# Patient Record
Sex: Male | Born: 1940
Health system: Southern US, Community
[De-identification: ages and names within clinical notes are randomized; demographics above are authoritative.]

## PROBLEM LIST (undated history)

## (undated) DIAGNOSIS — K219 Gastro-esophageal reflux disease without esophagitis: Secondary | ICD-10-CM

## (undated) DIAGNOSIS — E785 Hyperlipidemia, unspecified: Secondary | ICD-10-CM

## (undated) DIAGNOSIS — J449 Chronic obstructive pulmonary disease, unspecified: Secondary | ICD-10-CM

## (undated) DIAGNOSIS — R0600 Dyspnea, unspecified: Secondary | ICD-10-CM

## (undated) DIAGNOSIS — Z809 Family history of malignant neoplasm, unspecified: Secondary | ICD-10-CM

## (undated) DIAGNOSIS — I10 Essential (primary) hypertension: Secondary | ICD-10-CM

## (undated) DIAGNOSIS — C801 Malignant (primary) neoplasm, unspecified: Secondary | ICD-10-CM

## (undated) HISTORY — DX: Family history of malignant neoplasm, unspecified: Z80.9

## (undated) HISTORY — PX: HEMORROIDECTOMY: SUR656

## (undated) HISTORY — DX: Hyperlipidemia, unspecified: E78.5

## (undated) HISTORY — DX: Essential (primary) hypertension: I10

## (undated) HISTORY — PX: COLONOSCOPY: SHX174

## (undated) MED FILL — Dexamethasone Sodium Phosphate Inj 100 MG/10ML: INTRAMUSCULAR | Qty: 1 | Status: AC

---

## 2009-04-01 ENCOUNTER — Ambulatory Visit: Payer: Self-pay | Admitting: Gastroenterology

## 2010-08-09 ENCOUNTER — Ambulatory Visit: Payer: Self-pay | Admitting: Otolaryngology

## 2011-02-28 ENCOUNTER — Ambulatory Visit: Payer: Self-pay | Admitting: Family Medicine

## 2012-07-16 ENCOUNTER — Ambulatory Visit: Payer: Self-pay | Admitting: Family Medicine

## 2012-11-15 ENCOUNTER — Ambulatory Visit: Payer: Self-pay | Admitting: Family Medicine

## 2013-09-05 DIAGNOSIS — Z1211 Encounter for screening for malignant neoplasm of colon: Secondary | ICD-10-CM | POA: Diagnosis not present

## 2013-09-05 DIAGNOSIS — Z136 Encounter for screening for cardiovascular disorders: Secondary | ICD-10-CM | POA: Diagnosis not present

## 2013-09-05 DIAGNOSIS — Z0000 Encounter for general adult medical examination without abnormal findings: Secondary | ICD-10-CM | POA: Diagnosis not present

## 2013-09-05 DIAGNOSIS — Z1331 Encounter for screening for depression: Secondary | ICD-10-CM | POA: Diagnosis not present

## 2013-10-09 DIAGNOSIS — I10 Essential (primary) hypertension: Secondary | ICD-10-CM | POA: Diagnosis not present

## 2013-10-09 DIAGNOSIS — E785 Hyperlipidemia, unspecified: Secondary | ICD-10-CM | POA: Diagnosis not present

## 2014-08-28 DIAGNOSIS — Z716 Tobacco abuse counseling: Secondary | ICD-10-CM | POA: Diagnosis not present

## 2014-08-28 DIAGNOSIS — I10 Essential (primary) hypertension: Secondary | ICD-10-CM | POA: Diagnosis not present

## 2014-09-25 DIAGNOSIS — I10 Essential (primary) hypertension: Secondary | ICD-10-CM | POA: Diagnosis not present

## 2014-09-25 DIAGNOSIS — Z1389 Encounter for screening for other disorder: Secondary | ICD-10-CM | POA: Diagnosis not present

## 2014-09-25 DIAGNOSIS — R42 Dizziness and giddiness: Secondary | ICD-10-CM | POA: Diagnosis not present

## 2014-12-22 DIAGNOSIS — R42 Dizziness and giddiness: Secondary | ICD-10-CM | POA: Diagnosis not present

## 2014-12-22 DIAGNOSIS — K649 Unspecified hemorrhoids: Secondary | ICD-10-CM | POA: Diagnosis not present

## 2014-12-22 DIAGNOSIS — I10 Essential (primary) hypertension: Secondary | ICD-10-CM | POA: Diagnosis not present

## 2014-12-22 DIAGNOSIS — K5790 Diverticulosis of intestine, part unspecified, without perforation or abscess without bleeding: Secondary | ICD-10-CM | POA: Diagnosis not present

## 2014-12-22 DIAGNOSIS — N402 Nodular prostate without lower urinary tract symptoms: Secondary | ICD-10-CM | POA: Diagnosis not present

## 2015-04-23 ENCOUNTER — Ambulatory Visit: Payer: Self-pay | Admitting: Family Medicine

## 2015-05-07 ENCOUNTER — Encounter: Payer: Self-pay | Admitting: Family Medicine

## 2015-05-07 ENCOUNTER — Ambulatory Visit (INDEPENDENT_AMBULATORY_CARE_PROVIDER_SITE_OTHER): Payer: BLUE CROSS/BLUE SHIELD | Admitting: Family Medicine

## 2015-05-07 VITALS — BP 138/72 | HR 100 | Temp 98.7°F | Resp 16 | Ht 66.0 in | Wt 145.2 lb

## 2015-05-07 DIAGNOSIS — E785 Hyperlipidemia, unspecified: Secondary | ICD-10-CM | POA: Diagnosis not present

## 2015-05-07 DIAGNOSIS — Z720 Tobacco use: Secondary | ICD-10-CM | POA: Insufficient documentation

## 2015-05-07 DIAGNOSIS — I10 Essential (primary) hypertension: Secondary | ICD-10-CM | POA: Diagnosis not present

## 2015-05-07 DIAGNOSIS — J410 Simple chronic bronchitis: Secondary | ICD-10-CM | POA: Insufficient documentation

## 2015-05-07 LAB — PULMONARY FUNCTION TEST

## 2015-05-07 MED ORDER — TIOTROPIUM BROMIDE MONOHYDRATE 18 MCG IN CAPS
18.0000 ug | ORAL_CAPSULE | Freq: Once | RESPIRATORY_TRACT | Status: DC
Start: 2015-05-07 — End: 2015-07-25

## 2015-05-07 NOTE — Patient Instructions (Signed)
Encourage discontinuation of smoking

## 2015-05-07 NOTE — Progress Notes (Signed)
Name: Eric [name]   MRN: [id number]    DOB: 1941-02-28   Date:05/07/2015       Progress Note  Subjective  Chief Complaint  Chief Complaint  Patient presents with  . Hypertension  . Hyperlipidemia    Hypertension This is a chronic problem. The current episode started more than 1 year ago. The problem is unchanged. The problem is controlled. Associated symptoms include anxiety and shortness of breath. Pertinent negatives include no blurred vision, chest pain, headaches, neck pain, orthopnea or palpitations. There are no associated agents to hypertension. Risk factors for coronary artery disease include male gender, smoking/tobacco exposure and stress. Past treatments include ACE inhibitors and diuretics. The current treatment provides moderate improvement. There are no compliance problems.   Hyperlipidemia This is a chronic problem. The current episode started more than 1 year ago. The problem is controlled. Recent lipid tests were reviewed and are normal. Factors aggravating his hyperlipidemia include fatty foods. Associated symptoms include shortness of breath. Pertinent negatives include no chest pain, focal weakness or myalgias. Current antihyperlipidemic treatment includes statins. The current treatment provides moderate improvement of lipids. There are no compliance problems.  Risk factors for coronary artery disease include dyslipidemia, hypertension, male sex and stress.  Breathing Problem He complains of cough, difficulty breathing and shortness of breath. There is no hemoptysis or wheezing. This is a chronic problem. The current episode started more than 1 year ago. The problem occurs intermittently. The problem has been unchanged. Pertinent negatives include no chest pain, fever, headaches, heartburn, myalgias, sore throat or weight loss. His symptoms are aggravated by exposure to smoke. His symptoms are alleviated by rest. He reports moderate improvement on treatment. Risk factors for  lung disease include smoking/tobacco exposure. His past medical history is significant for COPD.     Past Medical History  Diagnosis Date  . Hyperlipidemia   . Hypertension     History  Substance Use Topics  . Smoking status: Current Every Day Smoker  . Smokeless tobacco: Not on file  . Alcohol Use: No     Current outpatient prescriptions:  .  aspirin 81 MG tablet, Take 81 mg by mouth daily., Disp: , Rfl:  .  atorvastatin (LIPITOR) 20 MG tablet, Take 20 mg by mouth at bedtime., Disp: , Rfl: 2 .  triamterene-hydrochlorothiazide (MAXZIDE-25) 37.5-25 MG per tablet, TAKE 1 TABLET BY MOUTH EVERY DAY----STOP LISINOPRIL-HCTZ, Disp: , Rfl: 7  No Known Allergies  Review of Systems  Constitutional: Negative for fever, chills and weight loss.  HENT: Negative for congestion, hearing loss, sore throat and tinnitus.   Eyes: Negative for blurred vision, double vision and redness.  Respiratory: Positive for cough and shortness of breath. Negative for hemoptysis and wheezing.   Cardiovascular: Negative for chest pain, palpitations, orthopnea, claudication and leg swelling.  Gastrointestinal: Negative for heartburn, nausea, vomiting, diarrhea, constipation and blood in stool.  Genitourinary: Negative for dysuria, urgency, frequency and hematuria.  Musculoskeletal: Negative for myalgias, back pain, joint pain, falls and neck pain.  Skin: Negative for itching.  Neurological: Negative for dizziness, tingling, tremors, focal weakness, seizures, loss of consciousness, weakness and headaches.  Endo/Heme/Allergies: Does not bruise/bleed easily.  Psychiatric/Behavioral: Negative for depression and substance abuse. The patient is not nervous/anxious and does not have insomnia.      Objective  Filed Vitals:   05/07/15 1203  BP: 138/72  Pulse: 100  Temp: 98.7 F (37.1 C)  Resp: 16  Height: 5\' 6"  (1.676 m)  Weight: 145 lb 4 oz (  65.885 kg)  SpO2: 97%     Physical Exam  Constitutional: He is  oriented to person, place, and time and well-developed, well-nourished, and in no distress.  HENT:  Head: Normocephalic.  Eyes: EOM are normal. Pupils are equal, round, and reactive to light.  Neck: Normal range of motion. Neck supple. No thyromegaly present.  Cardiovascular: Normal rate, regular rhythm and normal heart sounds.   No murmur heard. Pulmonary/Chest: No respiratory distress. He has no wheezes.  Slightly diminished breath sounds with hyperresonance to palpation  Abdominal: Soft. Bowel sounds are normal.  Musculoskeletal: Normal range of motion. He exhibits no edema.  Lymphadenopathy:    He has no cervical adenopathy.  Neurological: He is alert and oriented to person, place, and time. No cranial nerve deficit. Gait normal. Coordination normal.  Skin: Skin is warm and dry. No rash noted.  Psychiatric: Mood, affect and judgment normal.      Assessment & Plan  1. Simple chronic bronchitis  - tiotropium (SPIRIVA HANDIHALER) 18 MCG inhalation capsule; Place 1 capsule (18 mcg total) into inhaler and inhale once.  Dispense: 30 capsule; Refill: 1  2. Essential hypertension Well-controlled  3. Hyperlipidemia Well-controlled  4. Tobacco abuse Again emphasized the need to stop is not willing at this point

## 2015-06-24 ENCOUNTER — Other Ambulatory Visit: Payer: Self-pay | Admitting: Family Medicine

## 2015-07-25 ENCOUNTER — Other Ambulatory Visit: Payer: Self-pay | Admitting: Family Medicine

## 2015-08-10 ENCOUNTER — Ambulatory Visit: Payer: Medicare Other | Admitting: Family Medicine

## 2015-08-13 ENCOUNTER — Ambulatory Visit (INDEPENDENT_AMBULATORY_CARE_PROVIDER_SITE_OTHER): Payer: BLUE CROSS/BLUE SHIELD | Admitting: Family Medicine

## 2015-08-13 ENCOUNTER — Encounter: Payer: Self-pay | Admitting: Family Medicine

## 2015-08-13 VITALS — BP 142/78 | HR 96 | Temp 98.0°F | Resp 16 | Ht 66.0 in | Wt 145.1 lb

## 2015-08-13 DIAGNOSIS — E785 Hyperlipidemia, unspecified: Secondary | ICD-10-CM

## 2015-08-13 DIAGNOSIS — J438 Other emphysema: Secondary | ICD-10-CM

## 2015-08-13 DIAGNOSIS — I10 Essential (primary) hypertension: Secondary | ICD-10-CM | POA: Diagnosis not present

## 2015-08-13 DIAGNOSIS — Z720 Tobacco use: Secondary | ICD-10-CM | POA: Diagnosis not present

## 2015-08-13 MED ORDER — ATORVASTATIN CALCIUM 20 MG PO TABS: 20.0000 mg | ORAL_TABLET | Freq: Every day | ORAL | Status: DC

## 2015-08-13 NOTE — Progress Notes (Signed)
Name: Eric [name]   MRN: [id number]    DOB: 27-Dec-1940   Date:08/13/2015       Progress Note  Subjective  Chief Complaint  Chief Complaint  Patient presents with  . Hyperlipidemia    pt here for 3 month follow up  . Hypertension  . Nicotine Dependence    HPI   Hyperlipidemia  Patient has a history of hyperlipidemia for over 5 years.  Current medical regimen consist of atorvastatin 20 mg daily at bedtime .  Compliance is good .  Diet and exercise are currently followed usually .  Risk factors for cardiovascular disease include hyperlipidemia hypertension and advanced age and tobacco abuse .   There have been no side effects from the medication.    Hypertension   Patient presents for follow-up of hypertension. It has been present for ovover 5.  Patient states that there is compliance with medical regimen which consists of  lisinopril HCT 20-12 0.5 Reason dailyThere is no end organ disease. Cardiac risk factors include hypertension hyperlipidemia and diabetes.  Exercise regimen consist of some walkingDiet consist of some salt restriction  .  Tobacco abuse  Patient is a smoker despite worse. She is currently smoking  COPD  Patient has a long-standing history of tobacco abuse. Continues to smoke 1/3-1/2 pack per day. He has minimal cough wheezing currently.   Past Medical History  Diagnosis Date  . Hyperlipidemia   . Hypertension     Social History  Substance Use Topics  . Smoking status: Current Every Day Smoker  . Smokeless tobacco: Not on file  . Alcohol Use: No     Current outpatient prescriptions:  .  aspirin 81 MG tablet, Take 81 mg by mouth daily., Disp: , Rfl:  .  atorvastatin (LIPITOR) 20 MG tablet, TAKE 1 TABLET BY MOUTH AT BEDTIME, Disp: 30 tablet, Rfl: 2 .  SPIRIVA HANDIHALER 18 MCG inhalation capsule, PLACE 1 CAPSULE (18 MCG TOTAL) INTO INHALER AND INHALE ONCE., Disp: 90 capsule, Rfl: 1 .  triamterene-hydrochlorothiazide (MAXZIDE-25) 37.5-25 MG per  tablet, TAKE 1 TABLET BY MOUTH EVERY DAY----STOP LISINOPRIL-HCTZ, Disp: , Rfl: 7  No Known Allergies  Review of Systems  Constitutional: Negative for fever, chills and weight loss.  HENT: Negative for congestion, hearing loss, sore throat and tinnitus.   Eyes: Negative for blurred vision, double vision and redness.  Respiratory: Negative for cough, hemoptysis and shortness of breath.   Cardiovascular: Negative for chest pain, palpitations, orthopnea, claudication and leg swelling.  Gastrointestinal: Negative for heartburn, nausea, vomiting, diarrhea, constipation and blood in stool.  Genitourinary: Negative for dysuria, urgency, frequency and hematuria.  Musculoskeletal: Negative for myalgias, back pain, joint pain, falls and neck pain.  Skin: Negative for itching.  Neurological: Negative for dizziness, tingling, tremors, focal weakness, seizures, loss of consciousness, weakness and headaches.  Endo/Heme/Allergies: Does not bruise/bleed easily.  Psychiatric/Behavioral: Negative for depression and substance abuse. The patient is not nervous/anxious and does not have insomnia.      Objective  Filed Vitals:   08/13/15 1045  BP: 142/78  Pulse: 96  Temp: 98 F (36.7 C)  Resp: 16  Height: 5\' 6"  (1.676 m)  Weight: 145 lb 2 oz (65.828 kg)  SpO2: 98%     Physical Exam  Constitutional: He is oriented to person, place, and time and well-developed, well-nourished, and in no distress.  HENT:  Head: Normocephalic.  Eyes: EOM are normal. Pupils are equal, round, and reactive to light.  Neck: Normal range of motion. Neck  supple. No thyromegaly present.  Cardiovascular: Normal rate, regular rhythm and normal heart sounds.   No murmur heard. Pulmonary/Chest: Effort normal. No respiratory distress. He has no wheezes.  Diminished breath sounds  Abdominal: Soft. Bowel sounds are normal.  Musculoskeletal: Normal range of motion. He exhibits no edema.  Lymphadenopathy:    He has no cervical  adenopathy.  Neurological: He is alert and oriented to person, place, and time. No cranial nerve deficit. Gait normal. Coordination normal.  Skin: Skin is warm and dry. No rash noted.  Psychiatric: Affect and judgment normal.      Assessment & Plan   1. Essential hypertension At goal of less than 150 over - Comprehensive Metabolic Panel (CMET)  2. Simple chronic bronchitis (HCC)  Continue Spiriva 3. Hyperlipidemia Labs - Lipid Profile - TSH  4. Tobacco abuse Again encouraged discontinuation

## 2015-08-17 DIAGNOSIS — I10 Essential (primary) hypertension: Secondary | ICD-10-CM | POA: Diagnosis not present

## 2015-08-17 DIAGNOSIS — E785 Hyperlipidemia, unspecified: Secondary | ICD-10-CM | POA: Diagnosis not present

## 2015-08-18 LAB — LIPID PANEL
CHOL/HDL RATIO: 4.3 ratio (ref 0.0–5.0)
CHOLESTEROL TOTAL: 150 mg/dL (ref 100–199)
HDL: 35 mg/dL — ABNORMAL LOW (ref >39)
LDL Calculated: 92 mg/dL (ref 0–99)
TRIGLYCERIDES: 115 mg/dL (ref 0–149)
VLDL CHOLESTEROL CAL: 23 mg/dL (ref 5–40)

## 2015-08-18 LAB — COMPREHENSIVE METABOLIC PANEL
ALBUMIN: 4.1 g/dL (ref 3.5–4.8)
ALT: 8 IU/L (ref 0–44)
AST: 11 IU/L (ref 0–40)
Albumin/Globulin Ratio: 1.5 (ref 1.1–2.5)
Alkaline Phosphatase: 79 IU/L (ref 39–117)
BUN / CREAT RATIO: 15 (ref 10–22)
BUN: 17 mg/dL (ref 8–27)
Bilirubin Total: 0.4 mg/dL (ref 0.0–1.2)
CO2: 25 mmol/L (ref 18–29)
CREATININE: 1.14 mg/dL (ref 0.76–1.27)
Calcium: 10.1 mg/dL (ref 8.6–10.2)
Chloride: 100 mmol/L (ref 97–106)
GFR calc Af Amer: 73 mL/min/{1.73_m2} (ref >59)
GFR, EST NON AFRICAN AMERICAN: 63 mL/min/{1.73_m2} (ref >59)
GLUCOSE: 131 mg/dL — AB (ref 65–99)
Globulin, Total: 2.7 g/dL (ref 1.5–4.5)
Potassium: 4.9 mmol/L (ref 3.5–5.2)
SODIUM: 142 mmol/L (ref 136–144)
Total Protein: 6.8 g/dL (ref 6.0–8.5)

## 2015-08-18 LAB — TSH: TSH: 1.47 u[IU]/mL (ref 0.450–4.500)

## 2015-12-15 ENCOUNTER — Ambulatory Visit: Payer: Medicare Other | Admitting: Family Medicine

## 2016-02-14 ENCOUNTER — Other Ambulatory Visit: Payer: Self-pay | Admitting: Family Medicine

## 2016-02-17 ENCOUNTER — Other Ambulatory Visit: Payer: Self-pay | Admitting: Family Medicine

## 2016-03-16 ENCOUNTER — Other Ambulatory Visit: Payer: Self-pay | Admitting: Family Medicine

## 2016-09-12 ENCOUNTER — Other Ambulatory Visit: Payer: Self-pay | Admitting: Family Medicine

## 2017-03-16 ENCOUNTER — Encounter: Payer: Self-pay | Admitting: Family Medicine

## 2017-03-16 ENCOUNTER — Ambulatory Visit (INDEPENDENT_AMBULATORY_CARE_PROVIDER_SITE_OTHER): Payer: Medicare PPO | Admitting: Family Medicine

## 2017-03-16 VITALS — BP 139/78 | HR 103 | Temp 98.5°F | Resp 16 | Ht 66.0 in | Wt 142.2 lb

## 2017-03-16 DIAGNOSIS — E7800 Pure hypercholesterolemia, unspecified: Secondary | ICD-10-CM

## 2017-03-16 DIAGNOSIS — I10 Essential (primary) hypertension: Secondary | ICD-10-CM

## 2017-03-16 DIAGNOSIS — Z720 Tobacco use: Secondary | ICD-10-CM

## 2017-03-16 MED ORDER — TRIAMTERENE-HCTZ 37.5-25 MG PO TABS: 1.0000 | ORAL_TABLET | Freq: Every day | ORAL | 1 refills | Status: DC

## 2017-03-16 MED ORDER — ATORVASTATIN CALCIUM 20 MG PO TABS: 20.0000 mg | ORAL_TABLET | Freq: Every day | ORAL | 1 refills | Status: DC

## 2017-03-16 NOTE — Progress Notes (Signed)
Name: Eric [name]   MRN: [id number]    DOB: 27-Sep-1941   Date:03/16/2017       Progress Note  Subjective  Chief Complaint  Chief Complaint  Patient presents with  . Follow-up    BP  . Medication Refill    Hypertension  This is a chronic problem. The problem is unchanged. The problem is controlled. Pertinent negatives include no blurred vision, chest pain, headaches, orthopnea or palpitations. Past treatments include diuretics. There is no history of kidney disease, CAD/MI or CVA.  Hyperlipidemia  This is a chronic problem. The problem is controlled. Recent lipid tests were reviewed and are normal. Pertinent negatives include no chest pain, leg pain or myalgias. Current antihyperlipidemic treatment includes statins.  Nicotine Dependence  Presents for initial visit. Symptoms include cravings. Symptoms are negative for insomnia. Preferred tobacco types include cigarettes. Preferred cigarette types include filtered. Preferred strength is regular. Preferred brands include Marlboro. His urge triggers include company of smokers, drinking coffee, driving, meal time and stress. His first smoke is from 6 to 8 AM. He smokes < 1/2 a pack of cigarettes per day. He started smoking when he was >63 years old. Past treatments include nothing. [name] is not interested in quitting. [name] has tried to quit 0 times.     Past Medical History:  Diagnosis Date  . Hyperlipidemia   . Hypertension     History reviewed. No pertinent surgical history.  Family History  Problem Relation Age of Onset  . Heart disease Mother   . COPD Father     Social History   Social History  . Marital status: Single    Spouse name: N/A  . Number of children: N/A  . Years of education: N/A   Occupational History  . Not on file.   Social History Main Topics  . Smoking status: Current Every Day Smoker  . Smokeless tobacco: Never Used  . Alcohol use No  . Drug use: No  . Sexual activity: Not on file   Other  Topics Concern  . Not on file   Social History Narrative  . No narrative on file     Current Outpatient Prescriptions:  .  aspirin 81 MG tablet, Take 81 mg by mouth daily., Disp: , Rfl:  .  atorvastatin (LIPITOR) 20 MG tablet, TAKE 1 TABLET (20 MG TOTAL) BY MOUTH AT BEDTIME., Disp: 90 tablet, Rfl: 1 .  SPIRIVA HANDIHALER 18 MCG inhalation capsule, PLACE 1 CAPSULE (18 MCG TOTAL) INTO INHALER AND INHALE ONCE., Disp: 90 capsule, Rfl: 1 .  triamterene-hydrochlorothiazide (MAXZIDE-25) 37.5-25 MG tablet, TAKE 1 TABLET BY MOUTH EVERY DAY, Disp: 30 tablet, Rfl: 7  No Known Allergies   Review of Systems  Eyes: Negative for blurred vision.  Cardiovascular: Negative for chest pain, palpitations and orthopnea.  Musculoskeletal: Negative for myalgias.  Neurological: Negative for headaches.  Psychiatric/Behavioral: The patient does not have insomnia.      Objective  Vitals:   03/16/17 0940  BP: 139/78  Pulse: (!) 103  Resp: 16  Temp: 98.5 F (36.9 C)  TempSrc: Oral  SpO2: 96%  Weight: 142 lb 3.2 oz (64.5 kg)  Height: 5\' 6"  (1.676 m)    Physical Exam  Constitutional: He is oriented to person, place, and time and well-developed, well-nourished, and in no distress.  HENT:  Head: Normocephalic and atraumatic.  Cardiovascular: Normal rate, regular rhythm and normal heart sounds.   No murmur heard. Pulmonary/Chest: Effort normal and breath sounds normal. He has no wheezes.  Abdominal: Soft. Bowel sounds are normal. There is no tenderness.  Neurological: He is alert and oriented to person, place, and time.  Psychiatric: Mood, memory, affect and judgment normal.  Nursing note and vitals reviewed.    Assessment & Plan  1. Pure hypercholesterolemia Obtain FLP and adjust statin as appropriate - atorvastatin (LIPITOR) 20 MG tablet; Take 1 tablet (20 mg total) by mouth daily at 6 PM.  Dispense: 90 tablet; Refill: 1 - COMPLETE METABOLIC PANEL WITH GFR - Lipid panel  2. Essential  hypertension BP stable on present and hypertensive therapy - triamterene-hydrochlorothiazide (MAXZIDE-25) 37.5-25 MG tablet; Take 1 tablet by mouth daily.  Dispense: 90 tablet; Refill: 1  3. Tobacco abuse Patient is not ready to quit at this time, we will reevaluate and assess at next appointment   [facility] 03/16/2017 10:05 AM

## 2017-04-17 ENCOUNTER — Other Ambulatory Visit: Payer: Self-pay | Admitting: Family Medicine

## 2017-04-17 LAB — LIPID PANEL
CHOLESTEROL: 169 mg/dL (ref <200)
HDL: 40 mg/dL — ABNORMAL LOW (ref >40)
LDL CALC: 109 mg/dL — AB (ref <100)
Total CHOL/HDL Ratio: 4.2 Ratio (ref <5.0)
Triglycerides: 101 mg/dL (ref <150)
VLDL: 20 mg/dL (ref <30)

## 2017-04-17 LAB — COMPLETE METABOLIC PANEL WITH GFR
ALT: 9 U/L (ref 9–46)
AST: 11 U/L (ref 10–35)
Albumin: 4 g/dL (ref 3.6–5.1)
Alkaline Phosphatase: 73 U/L (ref 40–115)
BUN: 15 mg/dL (ref 7–25)
CALCIUM: 9.9 mg/dL (ref 8.6–10.3)
CHLORIDE: 102 mmol/L (ref 98–110)
CO2: 28 mmol/L (ref 20–31)
CREATININE: 1.24 mg/dL — AB (ref 0.70–1.18)
GFR, Est African American: 65 mL/min (ref >=60)
GFR, Est Non African American: 57 mL/min — ABNORMAL LOW (ref >=60)
Glucose, Bld: 105 mg/dL — ABNORMAL HIGH (ref 65–99)
Potassium: 4.7 mmol/L (ref 3.5–5.3)
Sodium: 139 mmol/L (ref 135–146)
Total Bilirubin: 0.5 mg/dL (ref 0.2–1.2)
Total Protein: 7.1 g/dL (ref 6.1–8.1)

## 2017-04-20 ENCOUNTER — Telehealth: Payer: Self-pay | Admitting: Family Medicine

## 2017-04-20 NOTE — Telephone Encounter (Signed)
Patient had an attack of 'dizziness' 2 nights ago, now resolved. He is wondering if it may be vertigo. I have recommended that he should come in for a complete evaluation and workup, he will schedule an appointment for Monday, July 2nd. Please schedule

## 2017-04-20 NOTE — Telephone Encounter (Signed)
Pt states that you had dx him with vertigo and he would like to know is there anything over the counter that he can take for it. States he had an attack with it the other night. Please return call [phone]

## 2017-04-20 NOTE — Telephone Encounter (Signed)
Dr. Manuella Ghazi, Pt states that you had dx him with vertigo and he would like to know is there anything over the counter that he can take for it. States he had an attack with it the other night. Please return call [phone]

## 2017-04-21 NOTE — Telephone Encounter (Signed)
Patient has scheduled appointment for dizziness on Tuesday April 25, 2017 @ 11:20am

## 2017-04-25 ENCOUNTER — Ambulatory Visit: Payer: Medicare PPO | Admitting: Family Medicine

## 2017-05-03 ENCOUNTER — Encounter: Payer: Self-pay | Admitting: Family Medicine

## 2017-05-03 ENCOUNTER — Ambulatory Visit (INDEPENDENT_AMBULATORY_CARE_PROVIDER_SITE_OTHER): Payer: Medicare PPO | Admitting: Family Medicine

## 2017-05-03 VITALS — BP 137/77 | HR 107 | Temp 98.1°F | Resp 17 | Ht 66.0 in | Wt 139.8 lb

## 2017-05-03 DIAGNOSIS — R739 Hyperglycemia, unspecified: Secondary | ICD-10-CM

## 2017-05-03 DIAGNOSIS — R42 Dizziness and giddiness: Secondary | ICD-10-CM

## 2017-05-03 LAB — POCT GLYCOSYLATED HEMOGLOBIN (HGB A1C): HEMOGLOBIN A1C: 5

## 2017-05-03 MED ORDER — MECLIZINE HCL 25 MG PO TABS: 25.0000 mg | ORAL_TABLET | Freq: Every day | ORAL | 2 refills | Status: DC | PRN

## 2017-05-03 NOTE — Progress Notes (Signed)
Name: Eric [name]   MRN: [id number]    DOB: 1940/12/15   Date:05/03/2017       Progress Note  Subjective  Chief Complaint  Chief Complaint  Patient presents with  . Dizziness    Dizziness  This is a recurrent problem. The current episode started 1 to 4 weeks ago (2 weeks ago). The problem has been resolved. Associated symptoms include vertigo. Pertinent negatives include no coughing, fatigue, fever, headaches, nausea, sore throat, visual change or vomiting. Associated symptoms comments: 'i was just laying down and it hit me!' described as dizziness and room and everything else was spinning around him.. Nothing aggravates the symptoms. He has tried nothing (he wishes to try Meclizine for vertigo) for the symptoms.     Past Medical History:  Diagnosis Date  . Hyperlipidemia   . Hypertension     History reviewed. No pertinent surgical history.  Family History  Problem Relation Age of Onset  . Heart disease Mother   . COPD Father     Social History   Social History  . Marital status: Single    Spouse name: N/A  . Number of children: N/A  . Years of education: N/A   Occupational History  . Not on file.   Social History Main Topics  . Smoking status: Current Every Day Smoker    Packs/day: 0.50    Types: Cigarettes  . Smokeless tobacco: Never Used  . Alcohol use No  . Drug use: No  . Sexual activity: Yes   Other Topics Concern  . Not on file   Social History Narrative  . No narrative on file     Current Outpatient Prescriptions:  .  aspirin 81 MG tablet, Take 81 mg by mouth daily., Disp: , Rfl:  .  atorvastatin (LIPITOR) 20 MG tablet, Take 1 tablet (20 mg total) by mouth daily at 6 PM., Disp: 90 tablet, Rfl: 1 .  triamterene-hydrochlorothiazide (MAXZIDE-25) 37.5-25 MG tablet, Take 1 tablet by mouth daily., Disp: 90 tablet, Rfl: 1 .  SPIRIVA HANDIHALER 18 MCG inhalation capsule, PLACE 1 CAPSULE (18 MCG TOTAL) INTO INHALER AND INHALE ONCE. (Patient not  taking: Reported on 03/16/2017), Disp: 90 capsule, Rfl: 1  No Known Allergies   Review of Systems  Constitutional: Negative for fatigue and fever.  HENT: Negative for sore throat.   Respiratory: Negative for cough.   Gastrointestinal: Negative for nausea and vomiting.  Neurological: Positive for dizziness and vertigo. Negative for headaches.      Objective  Vitals:   05/03/17 1002  BP: 137/77  Pulse: (!) 107  Resp: 17  Temp: 98.1 F (36.7 C)  TempSrc: Oral  SpO2: 97%  Weight: 139 lb 12.8 oz (63.4 kg)  Height: 5\' 6"  (1.676 m)    Physical Exam  Constitutional: He is oriented to person, place, and time and well-developed, well-nourished, and in no distress.  HENT:  Head: Normocephalic and atraumatic.  Left Ear: Tympanic membrane and ear canal normal.  Mouth/Throat: No posterior oropharyngeal erythema.  Right ear canal with cerumen impaction.  Cardiovascular: Normal rate, regular rhythm, S1 normal, S2 normal and normal heart sounds.   No murmur heard. Pulmonary/Chest: Effort normal and breath sounds normal. No respiratory distress. He has no wheezes. He has no rhonchi.  Abdominal: Soft. Bowel sounds are normal. There is no tenderness.  Musculoskeletal:       Right ankle: He exhibits no swelling.       Left ankle: He exhibits no swelling.  Neurological: He  is alert and oriented to person, place, and time. He has intact cranial nerves.  Skin: Skin is warm, dry and intact.  Psychiatric: Mood, memory, affect and judgment normal.  Nursing note and vitals reviewed.    Assessment & Plan  1. Vertigo By history and exam, started on meclizine as needed - meclizine (ANTIVERT) 25 MG tablet; Take 1 tablet (25 mg total) by mouth daily as needed for dizziness.  Dispense: 30 tablet; Refill: 2  2. Hyperglycemia Point-of-care A1c is 5.0%, considered normal - POCT glycosylated hemoglobin (Hb A1C)  Latonyia Lopata [facility] 05/03/2017 10:26 AM

## 2017-05-08 ENCOUNTER — Ambulatory Visit (INDEPENDENT_AMBULATORY_CARE_PROVIDER_SITE_OTHER): Payer: Medicare PPO

## 2017-05-08 ENCOUNTER — Ambulatory Visit: Payer: Medicare PPO

## 2017-05-08 VITALS — BP 132/56 | HR 88 | Temp 98.5°F | Ht 66.0 in | Wt 141.6 lb

## 2017-05-08 DIAGNOSIS — Z0000 Encounter for general adult medical examination without abnormal findings: Secondary | ICD-10-CM

## 2017-05-08 NOTE — Progress Notes (Signed)
Subjective:   Eric [name] is a 76 y.o. male who presents for Medicare Annual/Subsequent preventive examination.  Review of Systems:  N/A  Cardiac Risk Factors include: advanced age (>48men, >72 women);smoking/ tobacco exposure;dyslipidemia;hypertension;male gender     Objective:    Vitals: BP (!) 132/56 (BP Location: Right Arm)   Pulse 88   Temp 98.5 F (36.9 C) (Oral)   Ht 5\' 6"  (1.676 m)   Wt 141 lb 9.6 oz (64.2 kg)   BMI 22.85 kg/m   Body mass index is 22.85 kg/m.  Tobacco History  Smoking Status  . Current Every Day Smoker  . Packs/day: 0.50  . Types: Cigarettes  Smokeless Tobacco  . Never Used     Ready to quit: Not Answered Counseling given: Not Answered   Past Medical History:  Diagnosis Date  . Hyperlipidemia   . Hypertension    History reviewed. No pertinent surgical history. Family History  Problem Relation Age of Onset  . Heart disease Mother   . COPD Father    History  Sexual Activity  . Sexual activity: Yes    Outpatient Encounter Prescriptions as of 05/08/2017  Medication Sig  . aspirin 81 MG tablet Take 81 mg by mouth daily.  Marland Kitchen atorvastatin (LIPITOR) 20 MG tablet Take 1 tablet (20 mg total) by mouth daily at 6 PM.  . meclizine (ANTIVERT) 25 MG tablet Take 1 tablet (25 mg total) by mouth daily as needed for dizziness.  . triamterene-hydrochlorothiazide (MAXZIDE-25) 37.5-25 MG tablet Take 1 tablet by mouth daily.  . [DISCONTINUED] SPIRIVA HANDIHALER 18 MCG inhalation capsule PLACE 1 CAPSULE (18 MCG TOTAL) INTO INHALER AND INHALE ONCE. (Patient not taking: Reported on 03/16/2017)   No facility-administered encounter medications on file as of 05/08/2017.     Activities of Daily Living In your present state of health, do you have any difficulty performing the following activities: 05/08/2017 05/03/2017  Hearing? Tempie Donning  Vision? N Y  Difficulty concentrating or making decisions? N N  Walking or climbing stairs? N N  Dressing or bathing? N  N  Doing errands, shopping? N N  Preparing Food and eating ? N -  Using the Toilet? N -  In the past six months, have you accidently leaked urine? N -  Do you have problems with loss of bowel control? N -  Managing your Medications? N -  Managing your Finances? N -  Housekeeping or managing your Housekeeping? N -  Some recent data might be hidden    Patient Care Team: Roselee Nova, MD as PCP - General (Family Medicine)   Assessment:     Exercise Activities and Dietary recommendations Current Exercise Habits: The patient does not participate in regular exercise at present, Exercise limited by: Other - see comments (no energy)  Goals    . Increase water intake          Recommend increasing water intake to 4-6 glasses of water a day.       Fall Risk Fall Risk  05/08/2017 05/03/2017 03/16/2017 08/13/2015 05/07/2015  Falls in the past year? No No No No No   Depression Screen PHQ 2/9 Scores 05/08/2017 05/03/2017 03/16/2017 08/13/2015  PHQ - 2 Score 3 0 0 0  PHQ- 9 Score 9 - - -    Cognitive Function     6CIT Screen 05/08/2017  What Year? 0 points  What month? 0 points  What time? 0 points  Count back from 20 0 points  Months  in reverse 4 points  Repeat phrase 0 points  Total Score 4    Immunization History  Administered Date(s) Administered  . Influenza-Unspecified 10/07/2015   Screening Tests Health Maintenance  Topic Date Due  . INFLUENZA VACCINE  05/24/2017  . COLONOSCOPY  04/02/2019  . TETANUS/TDAP  08/23/2020  . PNA vac Low Risk Adult  Completed      Plan:  I have personally reviewed and addressed the Medicare Annual Wellness questionnaire and have noted the following in the patient's chart:  A. Medical and social history B. Use of alcohol, tobacco or illicit drugs  C. Current medications and supplements D. Functional ability and status E.  Nutritional status F.  Physical activity G. Advance directives H. List of other physicians I.    Hospitalizations, surgeries, and ER visits in previous 12 months J.  [place] such as hearing and vision if needed, cognitive and depression L. Referrals and appointments - none  In addition, I have reviewed and discussed with patient certain preventive protocols, quality metrics, and best practice recommendations. A written personalized care plan for preventive services as well as general preventive health recommendations were provided to patient.  See attached scanned questionnaire for additional information.   Signed,  Fabio Neighbors, LPN Nurse Health Advisor   MD Recommendations: None. I, as supervising physician, have reviewed the nurse health advisor's Medicare Wellness Visit note for this patient and concur with the findings and recommendations listed above.  Signed Syed Asad A. Manuella Ghazi MD Attending Physician.

## 2017-05-08 NOTE — Patient Instructions (Signed)
Eric [name] , Thank you for taking time to come for your Medicare Wellness Visit. I appreciate your ongoing commitment to your health goals. Please review the following plan we discussed and let me know if I can assist you in the future.   Screening recommendations/referrals: Colonoscopy: completed 04/01/09, due 03/2019 Recommended yearly ophthalmology/optometry visit for glaucoma screening and checkup Recommended yearly dental visit for hygiene and checkup  Vaccinations: Influenza vaccine: due 06/2017 Pneumococcal vaccine: completed series Tdap vaccine: completed 08/23/10, due 07/2020 Shingles vaccine: declined    Advanced directives: Advance directive discussed with you today. Even though you declined this today please call our office should you change your mind and we can give you the proper paperwork for you to fill out.  Conditions/risks identified: Smoking cessation; Recommend increasing water intake to 4-6 glasses of water a day.   Next appointment: 07/04/17 @ 10:00 AM  Preventive Care 75 Years and Older, Male Preventive care refers to lifestyle choices and visits with your health care provider that can promote health and wellness. What does preventive care include?  A yearly physical exam. This is also called an annual well check.  Dental exams once or twice a year.  Routine eye exams. Ask your health care provider how often you should have your eyes checked.  Personal lifestyle choices, including:  Daily care of your teeth and gums.  Regular physical activity.  Eating a healthy diet.  Avoiding tobacco and drug use.  Limiting alcohol use.  Practicing safe sex.  Taking low doses of aspirin every day.  Taking vitamin and mineral supplements as recommended by your health care provider. What happens during an annual well check? The services and screenings done by your health care provider during your annual well check will depend on your age, overall health,  lifestyle risk factors, and family history of disease. Counseling  Your health care provider may ask you questions about your:  Alcohol use.  Tobacco use.  Drug use.  Emotional well-being.  Home and relationship well-being.  Sexual activity.  Eating habits.  History of falls.  Memory and ability to understand (cognition).  Work and work Statistician. Screening  You may have the following tests or measurements:  Height, weight, and BMI.  Blood pressure.  Lipid and cholesterol levels. These may be checked every 5 years, or more frequently if you are over 75 years old.  Skin check.  Lung cancer screening. You may have this screening every year starting at age 75 if you have a 30-pack-year history of smoking and currently smoke or have quit within the past 15 years.  Fecal occult blood test (FOBT) of the stool. You may have this test every year starting at age 75.  Flexible sigmoidoscopy or colonoscopy. You may have a sigmoidoscopy every 5 years or a colonoscopy every 10 years starting at age 75.  Prostate cancer screening. Recommendations will vary depending on your family history and other risks.  Hepatitis C blood test.  Hepatitis B blood test.  Sexually transmitted disease (STD) testing.  Diabetes screening. This is done by checking your blood sugar (glucose) after you have not eaten for a while (fasting). You may have this done every 1-3 years.  Abdominal aortic aneurysm (AAA) screening. You may need this if you are a current or former smoker.  Osteoporosis. You may be screened starting at age 75 if you are at high risk. Talk with your health care provider about your test results, treatment options, and if necessary, the need for more  tests. Vaccines  Your health care provider may recommend certain vaccines, such as:  Influenza vaccine. This is recommended every year.  Tetanus, diphtheria, and acellular pertussis (Tdap, Td) vaccine. You may need a Td booster  every 10 years.  Zoster vaccine. You may need this after age 75.  Pneumococcal 13-valent conjugate (PCV13) vaccine. One dose is recommended after age 1.  Pneumococcal polysaccharide (PPSV23) vaccine. One dose is recommended after age 14. Talk to your health care provider about which screenings and vaccines you need and how often you need them. This information is not intended to replace advice given to you by your health care provider. Make sure you discuss any questions you have with your health care provider. Document Released: 11/06/2015 Document Revised: 06/29/2016 Document Reviewed: 08/11/2015 Elsevier Interactive Patient Education  2017 [place] Prevention in the Home Falls can cause injuries. They can happen to people of all ages. There are many things you can do to make your home safe and to help prevent falls. What can I do on the outside of my home?  Regularly fix the edges of walkways and driveways and fix any cracks.  Remove anything that might make you trip as you walk through a door, such as a raised step or threshold.  Trim any bushes or trees on the path to your home.  Use bright outdoor lighting.  Clear any walking paths of anything that might make someone trip, such as rocks or tools.  Regularly check to see if handrails are loose or broken. Make sure that both sides of any steps have handrails.  Any raised decks and porches should have guardrails on the edges.  Have any leaves, snow, or ice cleared regularly.  Use sand or salt on walking paths during winter.  Clean up any spills in your garage right away. This includes oil or grease spills. What can I do in the bathroom?  Use night lights.  Install grab bars by the toilet and in the tub and shower. Do not use towel bars as grab bars.  Use non-skid mats or decals in the tub or shower.  If you need to sit down in the shower, use a plastic, non-slip stool.  Keep the floor dry. Clean up any  water that spills on the floor as soon as it happens.  Remove soap buildup in the tub or shower regularly.  Attach bath mats securely with double-sided non-slip rug tape.  Do not have throw rugs and other things on the floor that can make you trip. What can I do in the bedroom?  Use night lights.  Make sure that you have a light by your bed that is easy to reach.  Do not use any sheets or blankets that are too big for your bed. They should not hang down onto the floor.  Have a firm chair that has side arms. You can use this for support while you get dressed.  Do not have throw rugs and other things on the floor that can make you trip. What can I do in the kitchen?  Clean up any spills right away.  Avoid walking on wet floors.  Keep items that you use a lot in easy-to-reach places.  If you need to reach something above you, use a strong step stool that has a grab bar.  Keep electrical cords out of the way.  Do not use floor polish or wax that makes floors slippery. If you must use wax, use non-skid floor  wax.  Do not have throw rugs and other things on the floor that can make you trip. What can I do with my stairs?  Do not leave any items on the stairs.  Make sure that there are handrails on both sides of the stairs and use them. Fix handrails that are broken or loose. Make sure that handrails are as long as the stairways.  Check any carpeting to make sure that it is firmly attached to the stairs. Fix any carpet that is loose or worn.  Avoid having throw rugs at the top or bottom of the stairs. If you do have throw rugs, attach them to the floor with carpet tape.  Make sure that you have a light switch at the top of the stairs and the bottom of the stairs. If you do not have them, ask someone to add them for you. What else can I do to help prevent falls?  Wear shoes that:  Do not have high heels.  Have rubber bottoms.  Are comfortable and fit you well.  Are closed  at the toe. Do not wear sandals.  If you use a stepladder:  Make sure that it is fully opened. Do not climb a closed stepladder.  Make sure that both sides of the stepladder are locked into place.  Ask someone to hold it for you, if possible.  Clearly mark and make sure that you can see:  Any grab bars or handrails.  First and last steps.  Where the edge of each step is.  Use tools that help you move around (mobility aids) if they are needed. These include:  Canes.  Walkers.  Scooters.  Crutches.  Turn on the lights when you go into a dark area. Replace any light bulbs as soon as they burn out.  Set up your furniture so you have a clear path. Avoid moving your furniture around.  If any of your floors are uneven, fix them.  If there are any pets around you, be aware of where they are.  Review your medicines with your doctor. Some medicines can make you feel dizzy. This can increase your chance of falling. Ask your doctor what other things that you can do to help prevent falls. This information is not intended to replace advice given to you by your health care provider. Make sure you discuss any questions you have with your health care provider. Document Released: 08/06/2009 Document Revised: 03/17/2016 Document Reviewed: 11/14/2014 Elsevier Interactive Patient Education  2017 [employer].

## 2017-07-04 ENCOUNTER — Ambulatory Visit: Payer: Medicare PPO | Admitting: Family Medicine

## 2017-10-09 ENCOUNTER — Other Ambulatory Visit: Payer: Self-pay | Admitting: Family Medicine

## 2017-10-09 DIAGNOSIS — E7800 Pure hypercholesterolemia, unspecified: Secondary | ICD-10-CM

## 2017-10-09 DIAGNOSIS — I10 Essential (primary) hypertension: Secondary | ICD-10-CM

## 2017-10-09 NOTE — Telephone Encounter (Signed)
Copied from [place] [id number]. Topic: Quick Communication - Rx Refill/Question >> Oct 09, 2017  3:52 PM Malena Catholic I, [place] wrote: Has the patient contacted their pharmacy yes    (Agent: If no, request that the patient contact the pharmacy for the refill Maxzide 37.5 MG and Lipitor 20 Mg    Preferred Pharmacy (with phone number [facility] street name [facility] [place] [phone]   Agent: Please be advised that RX refills may take up to 3 business days. We ask that you follow-up with your pharmacy.

## 2017-10-12 ENCOUNTER — Other Ambulatory Visit: Payer: Self-pay

## 2017-10-12 DIAGNOSIS — I10 Essential (primary) hypertension: Secondary | ICD-10-CM

## 2017-10-12 DIAGNOSIS — E7800 Pure hypercholesterolemia, unspecified: Secondary | ICD-10-CM

## 2017-10-12 MED ORDER — ATORVASTATIN CALCIUM 20 MG PO TABS: 20.0000 mg | ORAL_TABLET | Freq: Every day | ORAL | 0 refills | Status: DC

## 2017-10-12 MED ORDER — TRIAMTERENE-HCTZ 37.5-25 MG PO TABS: 1.0000 | ORAL_TABLET | Freq: Every day | ORAL | 0 refills | Status: DC

## 2017-10-12 NOTE — Progress Notes (Signed)
Last fasting labs were 03/2017 and his chol daosgae was increase and the pt was asked to come back in 3 months to check levels, no show for apt. Refilled a 30 day supply in order to give him some time to get an apt.

## 2017-10-26 ENCOUNTER — Telehealth: Payer: Self-pay | Admitting: Family Medicine

## 2017-10-26 DIAGNOSIS — I10 Essential (primary) hypertension: Secondary | ICD-10-CM

## 2017-10-26 NOTE — Telephone Encounter (Signed)
Tried to reach out to the patient but not able to leave a message. Receive a beeping noise after a couple of rings. I sent pt 30 day supply of his triamterene-hydrochlorothiazide (MAXZIDE-25) 37.5-25 MG tablet and atorvastatin (LIPITOR) 20 MG tablet on 10/12/2017 to give pt time to schedule an apt with fasting labs. Pt will need to schedule an apt ASAP.  Last visit and labs was 05/03/2017

## 2017-10-26 NOTE — Telephone Encounter (Signed)
Copied from [place] [id number]. Topic: Quick Communication - Rx Refill/Question >> Oct 26, 2017  3:38 PM Synthia Innocent wrote: Has the patient contacted their pharmacy? Yes, no refills available   (Agent: If no, request that the patient contact the pharmacy for the refill.)   Preferred Pharmacy (with phone number or street name): [facility] [place]: Please be advised that RX refills may take up to 3 business days. We ask that you follow-up with your pharmacy. Requesting refill on triamterene-hydrochlorothiazide (MAXZIDE-25) 37.5-25 MG tablet  and atorvastatin (LIPITOR) 20 MG tablet

## 2017-10-26 NOTE — Telephone Encounter (Signed)
Patient requesting refill. 

## 2017-11-02 ENCOUNTER — Ambulatory Visit: Payer: Medicare PPO | Admitting: Family Medicine

## 2017-11-02 ENCOUNTER — Encounter: Payer: Self-pay | Admitting: Family Medicine

## 2017-11-02 VITALS — BP 136/66 | HR 98 | Temp 98.1°F | Resp 18 | Ht 66.0 in | Wt 140.1 lb

## 2017-11-02 DIAGNOSIS — I10 Essential (primary) hypertension: Secondary | ICD-10-CM | POA: Diagnosis not present

## 2017-11-02 DIAGNOSIS — Z23 Encounter for immunization: Secondary | ICD-10-CM | POA: Diagnosis not present

## 2017-11-02 DIAGNOSIS — E7800 Pure hypercholesterolemia, unspecified: Secondary | ICD-10-CM | POA: Diagnosis not present

## 2017-11-02 LAB — COMPLETE METABOLIC PANEL WITH GFR
AG Ratio: 1.4 (calc) (ref 1.0–2.5)
ALKALINE PHOSPHATASE (APISO): 77 U/L (ref 40–115)
ALT: 7 U/L — ABNORMAL LOW (ref 9–46)
AST: 10 U/L (ref 10–35)
Albumin: 4 g/dL (ref 3.6–5.1)
BUN/Creatinine Ratio: 11 (calc) (ref 6–22)
BUN: 16 mg/dL (ref 7–25)
CO2: 29 mmol/L (ref 20–32)
CREATININE: 1.5 mg/dL — AB (ref 0.70–1.18)
Calcium: 9.8 mg/dL (ref 8.6–10.3)
Chloride: 104 mmol/L (ref 98–110)
GFR, Est African American: 52 mL/min/{1.73_m2} — ABNORMAL LOW (ref >=60)
GFR, Est Non African American: 45 mL/min/{1.73_m2} — ABNORMAL LOW (ref >=60)
GLUCOSE: 106 mg/dL — AB (ref 65–99)
Globulin: 2.8 g/dL (calc) (ref 1.9–3.7)
Potassium: 5.3 mmol/L (ref 3.5–5.3)
Sodium: 139 mmol/L (ref 135–146)
Total Bilirubin: 0.4 mg/dL (ref 0.2–1.2)
Total Protein: 6.8 g/dL (ref 6.1–8.1)

## 2017-11-02 LAB — LIPID PANEL
CHOL/HDL RATIO: 4.8 (calc) (ref <5.0)
Cholesterol: 230 mg/dL — ABNORMAL HIGH (ref <200)
HDL: 48 mg/dL (ref >40)
LDL CHOLESTEROL (CALC): 163 mg/dL — AB
Non-HDL Cholesterol (Calc): 182 mg/dL (calc) — ABNORMAL HIGH (ref <130)
Triglycerides: 82 mg/dL (ref <150)

## 2017-11-02 MED ORDER — TRIAMTERENE-HCTZ 37.5-25 MG PO TABS: 1.0000 | ORAL_TABLET | Freq: Every day | ORAL | 0 refills | Status: DC

## 2017-11-02 MED ORDER — ATORVASTATIN CALCIUM 20 MG PO TABS: 20.0000 mg | ORAL_TABLET | Freq: Every day | ORAL | 0 refills | Status: DC

## 2017-11-02 NOTE — Progress Notes (Signed)
Name: Eric [name]   MRN: [id number]    DOB: [birth year]   Date:11/02/2017       Progress Note  Subjective  Chief Complaint  Chief Complaint  Patient presents with  . Medication Refill  . Hypertension    Denies any symptoms  . Pure hypercholesterolemia    Pains in his feet and legs constantly  . Dizziness    Has been controlled recently    Hypertension  This is a chronic problem. The problem is unchanged. The problem is controlled. Pertinent negatives include no blurred vision, chest pain, headaches, palpitations or shortness of breath. Past treatments include diuretics. There is no history of kidney disease, CAD/MI or CVA.  Hyperlipidemia  This is a chronic problem. The problem is controlled. Recent lipid tests were reviewed and are high. Pertinent negatives include no chest pain, leg pain, myalgias or shortness of breath. Current antihyperlipidemic treatment includes statins.     Past Medical History:  Diagnosis Date  . Hyperlipidemia   . Hypertension     No past surgical history on file.  Family History  Problem Relation Age of Onset  . Heart disease Mother   . COPD Father     Social History   Socioeconomic History  . Marital status: Single    Spouse name: Not on file  . Number of children: Not on file  . Years of education: Not on file  . Highest education level: Not on file  Social Needs  . Financial resource strain: Not on file  . Food insecurity - worry: Not on file  . Food insecurity - inability: Not on file  . Transportation needs - medical: Not on file  . Transportation needs - non-medical: Not on file  Occupational History  . Not on file  Tobacco Use  . Smoking status: Current Every Day Smoker    Packs/day: 0.50    Types: Cigarettes  . Smokeless tobacco: Never Used  Substance and Sexual Activity  . Alcohol use: Yes    Alcohol/week: 0.0 oz    Comment: occasional beer (1)  . Drug use: No  . Sexual activity: Yes  Other Topics Concern  . Not  on file  Social History Narrative  . Not on file     Current Outpatient Medications:  .  aspirin 81 MG tablet, Take 81 mg by mouth daily., Disp: , Rfl:  .  atorvastatin (LIPITOR) 20 MG tablet, Take 1 tablet (20 mg total) by mouth daily at 6 PM., Disp: 30 tablet, Rfl: 0 .  meclizine (ANTIVERT) 25 MG tablet, Take 1 tablet (25 mg total) by mouth daily as needed for dizziness., Disp: 30 tablet, Rfl: 2 .  triamterene-hydrochlorothiazide (MAXZIDE-25) 37.5-25 MG tablet, Take 1 tablet by mouth daily., Disp: 30 tablet, Rfl: 0  No Known Allergies   Review of Systems  Eyes: Negative for blurred vision.  Respiratory: Negative for shortness of breath.   Cardiovascular: Negative for chest pain and palpitations.  Musculoskeletal: Negative for myalgias.  Neurological: Negative for headaches.      Objective  Vitals:   11/02/17 0918  BP: 136/66  Pulse: 98  Resp: 18  Temp: 98.1 F (36.7 C)  TempSrc: Oral  SpO2: 96%  Weight: 140 lb 1.6 oz (63.5 kg)  Height: 5\' 6"  (1.676 m)    Physical Exam  Constitutional: He is oriented to person, place, and time and well-developed, well-nourished, and in no distress.  HENT:  Head: Normocephalic and atraumatic.  Cardiovascular: Normal rate, regular rhythm and normal  heart sounds.  No murmur heard. Pulmonary/Chest: Effort normal and breath sounds normal. He has no wheezes.  Abdominal: Soft. Bowel sounds are normal. There is no tenderness.  Musculoskeletal: He exhibits no edema.  Neurological: He is alert and oriented to person, place, and time.  Psychiatric: Mood, memory, affect and judgment normal.  Nursing note and vitals reviewed.      Assessment & Plan  1. Need for immunization against influenza  - Flu vaccine HIGH DOSE PF (Fluzone High dose)  2. Pure hypercholesterolemia Obtain FLP, continue on statin - atorvastatin (LIPITOR) 20 MG tablet; Take 1 tablet (20 mg total) by mouth daily at 6 PM.  Dispense: 90 tablet; Refill: 0 - Lipid  panel - COMPLETE METABOLIC PANEL WITH GFR  3. Essential hypertension BP stable on present antihypertensive treatment - triamterene-hydrochlorothiazide (MAXZIDE-25) 37.5-25 MG tablet; Take 1 tablet by mouth daily.  Dispense: 90 tablet; Refill: 0   [facility] 11/02/2017 9:44 AM

## 2017-11-06 ENCOUNTER — Other Ambulatory Visit: Payer: Self-pay

## 2017-11-06 ENCOUNTER — Telehealth: Payer: Self-pay

## 2017-11-06 DIAGNOSIS — E7800 Pure hypercholesterolemia, unspecified: Secondary | ICD-10-CM

## 2017-11-06 MED ORDER — ROSUVASTATIN CALCIUM 20 MG PO TABS
20.0000 mg | ORAL_TABLET | Freq: Every day | ORAL | 0 refills | Status: DC
Start: 2017-11-06 — End: 2018-02-07

## 2017-11-06 NOTE — Telephone Encounter (Signed)
Copied from [place] [id number]. Topic: Quick Communication - Rx Refill/Question >> Oct 26, 2017  3:38 PM Synthia Innocent wrote: Has the patient contacted their pharmacy? Yes, no refills available   (Agent: If no, request that the patient contact the pharmacy for the refill.)   Preferred Pharmacy (with phone number or street name): [facility] [place]: Please be advised that RX refills may take up to 3 business days. We ask that you follow-up with your pharmacy. Requesting refill on triamterene-hydrochlorothiazide (MAXZIDE-25) 37.5-25 MG tablet  and atorvastatin (LIPITOR) 20 MG tablet  >> Nov 06, 2017  2:58 PM Boyd Kerbs wrote: Patient calling back about this prescription.  Not at pharmacy and has not heard anything. Put on 10/26/17 Please call patient regarding this prescription  He picked up one prescription but not this triamterene-hydrochlorothiazide (MAXZIDE-25) 37.5-25 MG tablet   [id number] [place] [place] - [address]  [address] [place] [address]  Phone: [phone] Fax: [phone]

## 2017-11-06 NOTE — Telephone Encounter (Signed)
Spoke to the pharmacy and pt picked up his Lipitor and the BP med's is waiting to be picked up. Notified pt and he stated he did pick up the Lipitor and he will pick up his BP med when its ready to be picked up.

## 2017-11-06 NOTE — Telephone Encounter (Signed)
Rx for Lipitor was sent to the pt pharmacy on 11/02/2017 with 90 tabs and [facility] [place]. I will call the pharamacy to see the reason it has not been refilled.

## 2018-01-31 ENCOUNTER — Other Ambulatory Visit: Payer: Self-pay

## 2018-01-31 DIAGNOSIS — I10 Essential (primary) hypertension: Secondary | ICD-10-CM

## 2018-01-31 MED ORDER — TRIAMTERENE-HCTZ 37.5-25 MG PO TABS: 1.0000 | ORAL_TABLET | Freq: Every day | ORAL | 0 refills | Status: DC

## 2018-01-31 NOTE — Telephone Encounter (Signed)
Refill request for Hypertension medication:  Triamterene-HCTZ 37.5 - 25 mg  Last office visit pertaining to hypertension: 11/02/2017  BP Readings from Last 3 Encounters:  11/02/17 136/66  05/08/17 (!) 132/56  05/03/17 137/77     Lab Results  Component Value Date   CREATININE 1.50 (H) 11/02/2017   BUN 16 11/02/2017   NA 139 11/02/2017   K 5.3 11/02/2017   CL 104 11/02/2017   CO2 29 11/02/2017   Follow-ups on file. 02/07/2018

## 2018-01-31 NOTE — Telephone Encounter (Signed)
Renal function decline noted Patient has an appt in one week I'll approve just 10 days of med, as patient may need to have this med switched

## 2018-02-07 ENCOUNTER — Ambulatory Visit: Payer: Medicare PPO | Admitting: Family Medicine

## 2018-02-07 ENCOUNTER — Encounter: Payer: Self-pay | Admitting: Family Medicine

## 2018-02-07 VITALS — BP 140/80 | HR 89 | Resp 16 | Ht 66.0 in | Wt 139.5 lb

## 2018-02-07 DIAGNOSIS — G8929 Other chronic pain: Secondary | ICD-10-CM | POA: Diagnosis not present

## 2018-02-07 DIAGNOSIS — M5440 Lumbago with sciatica, unspecified side: Secondary | ICD-10-CM | POA: Diagnosis not present

## 2018-02-07 DIAGNOSIS — J410 Simple chronic bronchitis: Secondary | ICD-10-CM | POA: Diagnosis not present

## 2018-02-07 DIAGNOSIS — E7800 Pure hypercholesterolemia, unspecified: Secondary | ICD-10-CM

## 2018-02-07 DIAGNOSIS — R739 Hyperglycemia, unspecified: Secondary | ICD-10-CM | POA: Diagnosis not present

## 2018-02-07 DIAGNOSIS — I10 Essential (primary) hypertension: Secondary | ICD-10-CM | POA: Diagnosis not present

## 2018-02-07 MED ORDER — TRIAMTERENE-HCTZ 37.5-25 MG PO TABS: 1.0000 | ORAL_TABLET | Freq: Every day | ORAL | 0 refills | Status: DC

## 2018-02-07 MED ORDER — ROSUVASTATIN CALCIUM 40 MG PO TABS: 40.0000 mg | ORAL_TABLET | Freq: Every day | ORAL | 0 refills | Status: DC

## 2018-02-07 NOTE — Progress Notes (Signed)
Name: Eric [name]   MRN: [id number]    DOB: Mar 05, 1941   Date:02/07/2018       Progress Note  Subjective  Chief Complaint  Chief Complaint  Patient presents with  . Hyperlipidemia  . Hypertension    HPI  HTN: he states bp is usually controlled, but was nervous coming in today. Used to see Dr. Manuella Ghazi. He denies chest pain or palpitation.   Chronic bronchitis: he states usually only has cough when has an URI, no spirometry recently. States not very physically active, only walks inside his house, therefore not sure if has SOB. No wheezing. He still smokes and is not ready to quit  Hyperlipidemia: taking Crestor for many years, 20 mg, last LDL not at goal, discussed life style modification and proper diet, increase dose of Crestor and recheck next visit  Low GFR: we will recheck next visit, make sure to avoid nsaid's, may take tylenol prn for pain  Chronic low back pain with intermittent sciatica: avoid nsaid's. No bowel or bladder incontinence. Tingling on either leg intermittently  Hyperglycemia: denies polyuria, polyphagia or polydipsia  Patient Active Problem List   Diagnosis Date Noted  . Chronic low back pain with sciatica 02/07/2018  . Hyperglycemia 05/03/2017  . Simple chronic bronchitis ([place]) 05/07/2015  . Essential hypertension 05/07/2015  . Hyperlipidemia 05/07/2015  . Tobacco abuse 05/07/2015    History reviewed. No pertinent surgical history.  Family History  Problem Relation Age of Onset  . Heart disease Mother   . COPD Father     Social History   Socioeconomic History  . Marital status: Single    Spouse name: Not on file  . Number of children: 1  . Years of education: Not on file  . Highest education level: Not on file  Occupational History  . Not on file  Social Needs  . Financial resource strain: Not on file  . Food insecurity:    Worry: Not on file    Inability: Not on file  . Transportation needs:    Medical: Not on file    Non-medical:  Not on file  Tobacco Use  . Smoking status: Current Every Day Smoker    Packs/day: 0.50    Years: 60.00    Pack years: 30.00    Types: Cigarettes  . Smokeless tobacco: Never Used  Substance and Sexual Activity  . Alcohol use: Yes    Alcohol/week: 0.0 oz    Comment: occasional beer (1)  . Drug use: No  . Sexual activity: Yes  Lifestyle  . Physical activity:    Days per week: Not on file    Minutes per session: Not on file  . Stress: Not on file  Relationships  . Social connections:    Talks on phone: Not on file    Gets together: Not on file    Attends religious service: Not on file    Active member of club or organization: Not on file    Attends meetings of clubs or organizations: Not on file    Relationship status: Not on file  . Intimate partner violence:    Fear of current or ex partner: Not on file    Emotionally abused: Not on file    Physically abused: Not on file    Forced sexual activity: Not on file  Other Topics Concern  . Not on file  Social History Narrative   Single, lives with male friend for the past 108 plus years.  Current Outpatient Medications:  .  meclizine (ANTIVERT) 25 MG tablet, Take 1 tablet (25 mg total) by mouth daily as needed for dizziness., Disp: 30 tablet, Rfl: 2 .  rosuvastatin (CRESTOR) 20 MG tablet, Take 1 tablet (20 mg total) by mouth at bedtime., Disp: 90 tablet, Rfl: 0 .  triamterene-hydrochlorothiazide (MAXZIDE-25) 37.5-25 MG tablet, Take 1 tablet by mouth daily., Disp: 10 tablet, Rfl: 0  No Known Allergies   ROS  Constitutional: Negative for fever or weight change.  Respiratory: Negative for cough and shortness of breath.   Cardiovascular: Negative for chest pain or palpitations.  Gastrointestinal: Negative for abdominal pain, no bowel changes.  Musculoskeletal: Negative for gait problem or joint swelling.  Skin: Negative for rash.  Neurological: Positive for intermittent vertigo ( seen by ENT in the past) but no  headache.  No other specific complaints in a complete review of systems (except as listed in HPI above).  Objective  Vitals:   02/07/18 1047  BP: (!) 156/80  Pulse: 89  Resp: 16  SpO2: 98%  Weight: 139 lb 8 oz (63.3 kg)  Height: 5\' 6"  (1.676 m)    Body mass index is 22.52 kg/m.  Physical Exam  Constitutional: Patient appears well-developed and well-nourished. No distress.  HEENT: head atraumatic, normocephalic, pupils equal and reactive to light,  neck supple, throat within normal limits Cardiovascular: Normal rate, regular rhythm and normal heart sounds.  No murmur heard. No BLE edema. Pulmonary/Chest: Effort normal and breath sounds normal. No respiratory distress. Abdominal: Soft.  There is no tenderness. Psychiatric: Patient has a normal mood and affect. behavior is normal. Judgment and thought content normal. Muscular Skeletal: normal rom of spine, negative straight leg raise, no pain during palpation of lumbar spine  PHQ2/9: Depression screen [facility] 2/9 11/02/2017 05/08/2017 05/03/2017 03/16/2017 08/13/2015  Decreased Interest 0 3 0 0 0  Down, Depressed, Hopeless 0 0 0 0 0  PHQ - 2 Score 0 3 0 0 0  Altered sleeping - 2 - - -  Tired, decreased energy - 3 - - -  Change in appetite - 0 - - -  Feeling bad or failure about yourself  - 0 - - -  Trouble concentrating - 0 - - -  Moving slowly or fidgety/restless - 1 - - -  Suicidal thoughts - 0 - - -  PHQ-9 Score - 9 - - -  Difficult doing work/chores - Somewhat difficult - - -    Fall Risk: Fall Risk  02/07/2018 02/07/2018 11/02/2017 05/08/2017 05/03/2017  Falls in the past year? No No No No No     Functional Status Survey: Is the patient deaf or have difficulty hearing?: No Does the patient have difficulty seeing, even when wearing glasses/contacts?: No Does the patient have difficulty concentrating, remembering, or making decisions?: No Does the patient have difficulty walking or climbing stairs?: No Does the patient have  difficulty dressing or bathing?: No Does the patient have difficulty doing errands alone such as visiting a doctor's office or shopping?: No   Assessment & Plan  1. Simple chronic bronchitis (HCC)  Not on medication, we will check spirometry next visit   2. Chronic midline low back pain with sciatica, sciatica laterality unspecified  Discussed gabapentin but he wants to hold off , he states tingling is not constant  3. Pure hypercholesterolemia  He states he has been taking medication daily, last LDL above 150, we will adjust dose and repeat labs  - rosuvastatin (CRESTOR) 40 MG tablet;  Take 1 tablet (40 mg total) by mouth at bedtime.  Dispense: 90 tablet; Refill: 0  4. Essential hypertension  EKG next visit  - triamterene-hydrochlorothiazide (MAXZIDE-25) 37.5-25 MG tablet; Take 1 tablet by mouth daily.  Dispense: 90 tablet; Refill: 0  5. Hyperglycemia  Recheck labs next visit

## 2018-05-02 ENCOUNTER — Other Ambulatory Visit: Payer: Self-pay | Admitting: Family Medicine

## 2018-05-02 DIAGNOSIS — I10 Essential (primary) hypertension: Secondary | ICD-10-CM

## 2018-05-02 DIAGNOSIS — E7800 Pure hypercholesterolemia, unspecified: Secondary | ICD-10-CM

## 2018-05-02 NOTE — Telephone Encounter (Signed)
Hypertension medication request: [facility]  Last office visit pertaining to hypertension: 11/02/2017  BP Readings from Last 3 Encounters:  02/07/18 140/80  11/02/17 136/66  05/08/17 (!) 132/56    Lab Results  Component Value Date   CREATININE 1.50 (H) 11/02/2017   BUN 16 11/02/2017   NA 139 11/02/2017   K 5.3 11/02/2017   CL 104 11/02/2017   CO2 29 11/02/2017   Refill Request for Cholesterol medication. Crestor   Lab Results  Component Value Date   CHOL 230 (H) 11/02/2017   HDL 48 11/02/2017   LDLCALC 163 (H) 11/02/2017   TRIG 82 11/02/2017   CHOLHDL 4.8 11/02/2017    Follow up on 05/09/2018

## 2018-05-09 ENCOUNTER — Ambulatory Visit: Payer: Medicare PPO | Admitting: Family Medicine

## 2018-05-09 ENCOUNTER — Encounter: Payer: Self-pay | Admitting: Family Medicine

## 2018-05-09 VITALS — BP 138/82 | HR 88 | Temp 98.4°F | Resp 16 | Ht 66.0 in | Wt 136.9 lb

## 2018-05-09 DIAGNOSIS — Z122 Encounter for screening for malignant neoplasm of respiratory organs: Secondary | ICD-10-CM

## 2018-05-09 DIAGNOSIS — N289 Disorder of kidney and ureter, unspecified: Secondary | ICD-10-CM

## 2018-05-09 DIAGNOSIS — I10 Essential (primary) hypertension: Secondary | ICD-10-CM | POA: Diagnosis not present

## 2018-05-09 DIAGNOSIS — Z720 Tobacco use: Secondary | ICD-10-CM

## 2018-05-09 DIAGNOSIS — M5440 Lumbago with sciatica, unspecified side: Secondary | ICD-10-CM | POA: Diagnosis not present

## 2018-05-09 DIAGNOSIS — G8929 Other chronic pain: Secondary | ICD-10-CM

## 2018-05-09 DIAGNOSIS — E7800 Pure hypercholesterolemia, unspecified: Secondary | ICD-10-CM | POA: Diagnosis not present

## 2018-05-09 DIAGNOSIS — R739 Hyperglycemia, unspecified: Secondary | ICD-10-CM | POA: Diagnosis not present

## 2018-05-09 DIAGNOSIS — J410 Simple chronic bronchitis: Secondary | ICD-10-CM | POA: Diagnosis not present

## 2018-05-09 MED ORDER — ROSUVASTATIN CALCIUM 40 MG PO TABS: 40.0000 mg | ORAL_TABLET | Freq: Every day | ORAL | 0 refills | Status: DC

## 2018-05-09 MED ORDER — ACETAMINOPHEN 500 MG PO TABS: 500.0000 mg | ORAL_TABLET | Freq: Four times a day (QID) | ORAL | 0 refills | Status: DC | PRN

## 2018-05-09 MED ORDER — TRIAMTERENE-HCTZ 37.5-25 MG PO TABS: 1.0000 | ORAL_TABLET | Freq: Every day | ORAL | 1 refills | Status: DC

## 2018-05-09 NOTE — Progress Notes (Signed)
Name: Eric [name]   MRN: [id number]    DOB: [birth year]   Date:05/09/2018       Progress Note  Subjective  Chief Complaint  Chief Complaint  Patient presents with  . Follow-up    3 mth f/u  . Hypertension    patient does not check his BP. no neg sx  . Bronchitis    patient has a dry cough & SOB at times but thinks it's related to being a smoker  . Hyperlipidemia    takes meds as directed. no neg sx  . Back Pain    chronic pain persists  . Hyperglycemia  . Low GFR    HPI  HTN: bp is under control, taking medication daily .   He denies chest pain or palpitation.   Chronic bronchitis: he has daily cough that is mild and dry, he states wet when he gets cold. Not on medication and not ready to quit smoking.  States not very physically active, but states that occasionally has SOB with activity. No wheezing.   Hyperlipidemia: taking Crestor for many years 40  mg, recheck labs today, denies myalgias.  Low GFR: we will recheck next visit, he has been taking aspirin and other nsaid's prn back pain, advised to stop and take tylenol prn for pain  Chronic low back pain with intermittent sciatica: avoid nsaid's. No bowel or bladder incontinence. Tingling on either leg intermittently, sometimes sharp and shooting pain, right now pain is zero/10   Hyperglycemia: denies polyuria, polyphagia or polydipsia. We will check hgbA1C   Patient Active Problem List   Diagnosis Date Noted  . Chronic low back pain with sciatica 02/07/2018  . Hyperglycemia 05/03/2017  . Simple chronic bronchitis ([place]) 05/07/2015  . Essential hypertension 05/07/2015  . Hyperlipidemia 05/07/2015  . Tobacco abuse 05/07/2015    History reviewed. No pertinent surgical history.  Family History  Problem Relation Age of Onset  . Heart disease Mother   . COPD Father     Social History   Socioeconomic History  . Marital status: Single    Spouse name: Not on file  . Number of children: 1  . Years of  education: Not on file  . Highest education level: High school graduate  Occupational History  . Occupation: retired   Scientific laboratory technician  . Financial resource strain: Not hard at all  . Food insecurity:    Worry: Never true    Inability: Never true  . Transportation needs:    Medical: No    Non-medical: No  Tobacco Use  . Smoking status: Current Every Day Smoker    Packs/day: 0.50    Years: 60.00    Pack years: 30.00    Types: Cigarettes  . Smokeless tobacco: Never Used  Substance and Sexual Activity  . Alcohol use: Yes    Alcohol/week: 0.0 oz    Comment: occasional beer (1)  . Drug use: No  . Sexual activity: Yes  Lifestyle  . Physical activity:    Days per week: 0 days    Minutes per session: 0 min  . Stress: Not at all  Relationships  . Social connections:    Talks on phone: Once a week    Gets together: Never    Attends religious service: 1 to 4 times per year    Active member of club or organization: No    Attends meetings of clubs or organizations: Never    Relationship status: Never married  . Intimate partner violence:  Fear of current or ex partner: No    Emotionally abused: No    Physically abused: No    Forced sexual activity: No  Other Topics Concern  . Not on file  Social History Narrative   Single, lives with male friend for the past 9 plus years.      Current Outpatient Medications:  .  aspirin 81 MG chewable tablet, Chew 81 mg by mouth daily., Disp: , Rfl:  .  rosuvastatin (CRESTOR) 40 MG tablet, TAKE 1 TABLET BY MOUTH EVERYDAY AT BEDTIME, Disp: 90 tablet, Rfl: 0 .  triamterene-hydrochlorothiazide (MAXZIDE-25) 37.5-25 MG tablet, TAKE 1 TABLET BY MOUTH EVERY DAY, Disp: 30 tablet, Rfl: 0 .  meclizine (ANTIVERT) 25 MG tablet, Take 1 tablet (25 mg total) by mouth daily as needed for dizziness. (Patient not taking: Reported on 05/09/2018), Disp: 30 tablet, Rfl: 2  No Known Allergies   ROS  Constitutional: Negative for fever or weight change.   Respiratory: Positive  for chronic cough and occasional shortness of breath.   Cardiovascular: Negative for chest pain or palpitations.  Gastrointestinal: Negative for abdominal pain, no bowel changes.  Musculoskeletal: Negative for gait problem or joint swelling.  Skin: Negative for rash.  Neurological: Negative for dizziness or headache.  No other specific complaints in a complete review of systems (except as listed in HPI above).  Objective  Vitals:   05/09/18 1019  BP: 138/82  Pulse: 88  Resp: 16  Temp: 98.4 F (36.9 C)  TempSrc: Oral  SpO2: 99%  Weight: 136 lb 14.4 oz (62.1 kg)  Height: 5\' 6"  (1.676 m)    Body mass index is 22.1 kg/m.  Physical Exam  Constitutional: Patient appears well-developed and well-nourished.  No distress.  HEENT: head atraumatic, normocephalic, pupils equal and reactive to light,  neck supple, throat within normal limits Cardiovascular: Normal rate, regular rhythm and normal heart sounds.  No murmur heard. No BLE edema. Pulmonary/Chest: Effort normal and breath sounds normal. No respiratory distress. Abdominal: Soft.  There is no tenderness. Psychiatric: Patient has a normal mood and affect. behavior is normal. Judgment and thought content normal. Muscular Skeletal: no pain during palpation of lumbar spine, negative straight leg raise  PHQ2/9: Depression screen [facility] 2/9 05/09/2018 11/02/2017 05/08/2017 05/03/2017 03/16/2017  Decreased Interest 0 0 3 0 0  Down, Depressed, Hopeless 0 0 0 0 0  PHQ - 2 Score 0 0 3 0 0  Altered sleeping 0 - 2 - -  Tired, decreased energy 2 - 3 - -  Change in appetite 0 - 0 - -  Feeling bad or failure about yourself  0 - 0 - -  Trouble concentrating 0 - 0 - -  Moving slowly or fidgety/restless 0 - 1 - -  Suicidal thoughts 0 - 0 - -  PHQ-9 Score 2 - 9 - -  Difficult doing work/chores - - Somewhat difficult - -    Fall Risk: Fall Risk  05/09/2018 02/07/2018 02/07/2018 11/02/2017 05/08/2017  Falls in the past year? No  No No No No     Functional Status Survey: Is the patient deaf or have difficulty hearing?: Yes Does the patient have difficulty seeing, even when wearing glasses/contacts?: No Does the patient have difficulty concentrating, remembering, or making decisions?: No Does the patient have difficulty walking or climbing stairs?: No Does the patient have difficulty dressing or bathing?: No Does the patient have difficulty doing errands alone such as visiting a doctor's office or shopping?: No  I personally  reviewed Family, Social and Surgical history with the patient/caregiver today.   Assessment & Plan  1. Simple chronic bronchitis ([place])  He states symptoms are not daily and does not want to quit smoking or take medications daily at this time   2. Pure hypercholesterolemia  - Lipid panel - rosuvastatin (CRESTOR) 40 MG tablet; Take 1 tablet (40 mg total) by mouth daily.  Dispense: 90 tablet; Refill: 0  3. Essential hypertension  - COMPLETE METABOLIC PANEL WITH GFR - triamterene-hydrochlorothiazide (MAXZIDE-25) 37.5-25 MG tablet; Take 1 tablet by mouth daily.  Dispense: 90 tablet; Refill: 1  4. Hyperglycemia  - Hemoglobin A1c  5. Tobacco abuse  Discussed again importance of quitting smoking   6. Function kidney decreased  Check GFR  7. Chronic midline low back pain with sciatica, sciatica laterality unspecified  Avoid NSAID's offered gabapentin, but he prefers otc, so advised Tylenol prn  - acetaminophen (TYLENOL) 500 MG tablet; Take 1 tablet (500 mg total) by mouth every 6 (six) hours as needed.  Dispense: 30 tablet; Refill: 0   8. Encounter for screening for malignant neoplasm of respiratory organs  - CT CHEST LUNG CA SCREEN LOW DOSE W/O CM

## 2018-05-10 LAB — LIPID PANEL
CHOLESTEROL: 168 mg/dL (ref <200)
HDL: 38 mg/dL — ABNORMAL LOW (ref >40)
LDL Cholesterol (Calc): 104 mg/dL (calc) — ABNORMAL HIGH
Non-HDL Cholesterol (Calc): 130 mg/dL (calc) — ABNORMAL HIGH (ref <130)
Total CHOL/HDL Ratio: 4.4 (calc) (ref <5.0)
Triglycerides: 147 mg/dL (ref <150)

## 2018-05-10 LAB — COMPLETE METABOLIC PANEL WITH GFR
AG RATIO: 1.3 (calc) (ref 1.0–2.5)
ALBUMIN MSPROF: 4 g/dL (ref 3.6–5.1)
ALKALINE PHOSPHATASE (APISO): 79 U/L (ref 40–115)
ALT: 11 U/L (ref 9–46)
AST: 13 U/L (ref 10–35)
BILIRUBIN TOTAL: 0.4 mg/dL (ref 0.2–1.2)
BUN / CREAT RATIO: 11 (calc) (ref 6–22)
BUN: 15 mg/dL (ref 7–25)
CHLORIDE: 100 mmol/L (ref 98–110)
CO2: 29 mmol/L (ref 20–32)
Calcium: 9.9 mg/dL (ref 8.6–10.3)
Creat: 1.39 mg/dL — ABNORMAL HIGH (ref 0.70–1.18)
GFR, EST AFRICAN AMERICAN: 57 mL/min/{1.73_m2} — AB (ref >=60)
GFR, Est Non African American: 49 mL/min/{1.73_m2} — ABNORMAL LOW (ref >=60)
GLUCOSE: 84 mg/dL (ref 65–139)
Globulin: 3.1 g/dL (calc) (ref 1.9–3.7)
POTASSIUM: 4.6 mmol/L (ref 3.5–5.3)
Sodium: 137 mmol/L (ref 135–146)
TOTAL PROTEIN: 7.1 g/dL (ref 6.1–8.1)

## 2018-05-10 LAB — HEMOGLOBIN A1C
Hgb A1c MFr Bld: 6.1 % of total Hgb — ABNORMAL HIGH (ref <5.7)
Mean Plasma Glucose: 128 (calc)
eAG (mmol/L): 7.1 (calc)

## 2018-05-21 ENCOUNTER — Telehealth: Payer: Self-pay | Admitting: *Deleted

## 2018-05-21 NOTE — Telephone Encounter (Signed)
Attempted to contact regarding lung screening referral. However there is no answer or voicemail option at phone number listed in EMR. Will attempt to contact at a later date.

## 2018-06-01 ENCOUNTER — Telehealth: Payer: Self-pay | Admitting: *Deleted

## 2018-06-01 DIAGNOSIS — Z122 Encounter for screening for malignant neoplasm of respiratory organs: Secondary | ICD-10-CM

## 2018-06-01 DIAGNOSIS — Z87891 Personal history of nicotine dependence: Secondary | ICD-10-CM

## 2018-06-01 NOTE — Telephone Encounter (Signed)
Received referral for initial lung cancer screening scan. Contacted patient and obtained smoking history,(current, 30 pack year) as well as answering questions related to screening process. Patient denies signs of lung cancer such as weight loss or hemoptysis. Patient denies comorbidity that would prevent curative treatment if lung cancer were found. Patient is scheduled for shared decision making visit and CT scan on 06/19/18.

## 2018-06-18 ENCOUNTER — Telehealth: Payer: Self-pay | Admitting: *Deleted

## 2018-06-18 NOTE — Telephone Encounter (Signed)
Called patient to remind him of his appointment for lung screening on 06-19-18 at [facility], voiced agreement and understanding.

## 2018-06-19 ENCOUNTER — Ambulatory Visit
Admission: RE | Admit: 2018-06-19 | Discharge: 2018-06-19 | Disposition: A | Discharge status: Home / Self Care | Payer: Medicare PPO | Source: Ambulatory Visit | Attending: Family Medicine | Admitting: Family Medicine

## 2018-06-19 ENCOUNTER — Inpatient Hospital Stay: Payer: Medicare PPO | Attending: Oncology | Admitting: Oncology

## 2018-06-19 ENCOUNTER — Encounter: Payer: Self-pay | Admitting: Family Medicine

## 2018-06-19 DIAGNOSIS — J42 Unspecified chronic bronchitis: Secondary | ICD-10-CM | POA: Diagnosis not present

## 2018-06-19 DIAGNOSIS — Z720 Tobacco use: Secondary | ICD-10-CM | POA: Insufficient documentation

## 2018-06-19 DIAGNOSIS — I700 Atherosclerosis of aorta: Secondary | ICD-10-CM | POA: Insufficient documentation

## 2018-06-19 DIAGNOSIS — Z122 Encounter for screening for malignant neoplasm of respiratory organs: Secondary | ICD-10-CM | POA: Diagnosis present

## 2018-06-19 DIAGNOSIS — J439 Emphysema, unspecified: Secondary | ICD-10-CM | POA: Diagnosis not present

## 2018-06-19 DIAGNOSIS — R05 Cough: Secondary | ICD-10-CM | POA: Diagnosis present

## 2018-06-19 DIAGNOSIS — Z87891 Personal history of nicotine dependence: Secondary | ICD-10-CM

## 2018-06-19 NOTE — Progress Notes (Signed)
In accordance with CMS guidelines, patient has met eligibility criteria including age, absence of signs or symptoms of lung cancer.  Social History   Tobacco Use  . Smoking status: Current Every Day Smoker    Packs/day: 0.50    Years: 60.00    Pack years: 30.00    Types: Cigarettes  . Smokeless tobacco: Never Used  Substance Use Topics  . Alcohol use: Yes    Alcohol/week: 0.0 standard drinks    Comment: occasional beer (1)  . Drug use: No     A shared decision-making session was conducted prior to the performance of CT scan. This includes one or more decision aids, includes benefits and harms of screening, follow-up diagnostic testing, over-diagnosis, false positive rate, and total radiation exposure.  Counseling on the importance of adherence to annual lung cancer LDCT screening, impact of co-morbidities, and ability or willingness to undergo diagnosis and treatment is imperative for compliance of the program.  Counseling on the importance of continued smoking cessation for former smokers; the importance of smoking cessation for current smokers, and information about tobacco cessation interventions have been given to patient including [place] and [facility] quit [place] programs.  Written order for lung cancer screening with LDCT has been given to the patient and any and all questions have been answered to the best of my abilities.   Yearly follow up will be coordinated by Burgess Estelle, Thoracic Navigator.  Faythe Casa, NP 06/19/2018 9:47 AM

## 2018-06-22 NOTE — Progress Notes (Signed)
Request for Dr. Ancil Boozer

## 2018-06-26 ENCOUNTER — Encounter: Payer: Self-pay | Admitting: *Deleted

## 2018-06-26 ENCOUNTER — Ambulatory Visit: Payer: Medicare PPO | Admitting: Family Medicine

## 2018-06-28 ENCOUNTER — Encounter: Payer: Self-pay | Admitting: Family Medicine

## 2018-06-28 ENCOUNTER — Ambulatory Visit (INDEPENDENT_AMBULATORY_CARE_PROVIDER_SITE_OTHER): Payer: Medicare PPO | Admitting: Family Medicine

## 2018-06-28 VITALS — BP 148/64 | HR 91 | Temp 98.1°F | Resp 16 | Ht 66.0 in | Wt 144.9 lb

## 2018-06-28 DIAGNOSIS — I10 Essential (primary) hypertension: Secondary | ICD-10-CM

## 2018-06-28 DIAGNOSIS — J432 Centrilobular emphysema: Secondary | ICD-10-CM

## 2018-06-28 DIAGNOSIS — E7800 Pure hypercholesterolemia, unspecified: Secondary | ICD-10-CM

## 2018-06-28 DIAGNOSIS — N183 Chronic kidney disease, stage 3 unspecified: Secondary | ICD-10-CM

## 2018-06-28 DIAGNOSIS — I2510 Atherosclerotic heart disease of native coronary artery without angina pectoris: Secondary | ICD-10-CM

## 2018-06-28 DIAGNOSIS — Z720 Tobacco use: Secondary | ICD-10-CM

## 2018-06-28 DIAGNOSIS — I700 Atherosclerosis of aorta: Secondary | ICD-10-CM

## 2018-06-28 DIAGNOSIS — Z23 Encounter for immunization: Secondary | ICD-10-CM

## 2018-06-28 DIAGNOSIS — R739 Hyperglycemia, unspecified: Secondary | ICD-10-CM

## 2018-06-28 MED ORDER — EZETIMIBE 10 MG PO TABS: 10.0000 mg | ORAL_TABLET | Freq: Every day | ORAL | 1 refills | Status: DC

## 2018-06-28 MED ORDER — METOPROLOL SUCCINATE ER 25 MG PO TB24: 25.0000 mg | ORAL_TABLET | Freq: Every day | ORAL | 1 refills | Status: DC

## 2018-06-28 MED ORDER — ROSUVASTATIN CALCIUM 40 MG PO TABS: 40.0000 mg | ORAL_TABLET | Freq: Every day | ORAL | 0 refills | Status: DC

## 2018-06-28 NOTE — Progress Notes (Signed)
Name: Eric [name]   MRN: [id number]    DOB: [birth year]   Date:06/28/2018       Progress Note  Subjective  Chief Complaint  Chief Complaint  Patient presents with  . Discuss CT Results    HPI  CT results: showed emphysema , 3 vessel CAD and aorta atherosclerosis. He denies chest pain or decrease in exercise tolerance. He is on high dose statin but LDL is above 70. He is now willing to try quitting smoking. He is also willing to add zetia to current regiment and eat healthier  CKI stage III: discussed avoiding nsaid's, we will monitor for now. No symptoms.   Hyperglycemia, hgbA1C went up from 5.0% to 6.1%, he denies polyphagia, polyuria or polydipsia.   HTN: bp elevated today, we will add beta-blocker because of CAD    Patient Active Problem List   Diagnosis Date Noted  . Coronary artery calcification seen on CAT scan 06/28/2018  . Centrilobular emphysema ([place]) 06/28/2018  . Chronic kidney disease, stage III (moderate) ([place]) 06/28/2018  . Atherosclerosis of aorta ([place]) 06/19/2018  . Chronic low back pain with sciatica 02/07/2018  . Hyperglycemia 05/03/2017  . Simple chronic bronchitis ([place]) 05/07/2015  . Essential hypertension 05/07/2015  . Hyperlipidemia 05/07/2015  . Tobacco abuse 05/07/2015    No past surgical history on file.  Family History  Problem Relation Age of Onset  . Heart disease Mother   . COPD Father     Social History   Socioeconomic History  . Marital status: Single    Spouse name: Not on file  . Number of children: 1  . Years of education: Not on file  . Highest education level: High school graduate  Occupational History  . Occupation: retired   Scientific laboratory technician  . Financial resource strain: Not hard at all  . Food insecurity:    Worry: Never true    Inability: Never true  . Transportation needs:    Medical: No    Non-medical: No  Tobacco Use  . Smoking status: Current Every Day Smoker    Packs/day: 0.50    Years: 60.00    Pack years:  30.00    Types: Cigarettes  . Smokeless tobacco: Never Used  Substance and Sexual Activity  . Alcohol use: Yes    Alcohol/week: 0.0 standard drinks    Comment: occasional beer (1)  . Drug use: No  . Sexual activity: Yes  Lifestyle  . Physical activity:    Days per week: 0 days    Minutes per session: 0 min  . Stress: Not at all  Relationships  . Social connections:    Talks on phone: Once a week    Gets together: Never    Attends religious service: 1 to 4 times per year    Active member of club or organization: No    Attends meetings of clubs or organizations: Never    Relationship status: Never married  . Intimate partner violence:    Fear of current or ex partner: No    Emotionally abused: No    Physically abused: No    Forced sexual activity: No  Other Topics Concern  . Not on file  Social History Narrative   Single, lives with male friend for the past 85 plus years.      Current Outpatient Medications:  .  acetaminophen (TYLENOL) 500 MG tablet, Take 1 tablet (500 mg total) by mouth every 6 (six) hours as needed., Disp: 30 tablet, Rfl: 0 .  aspirin 81 MG chewable tablet, Chew 81 mg by mouth daily., Disp: , Rfl:  .  meclizine (ANTIVERT) 25 MG tablet, Take 1 tablet (25 mg total) by mouth daily as needed for dizziness., Disp: 30 tablet, Rfl: 2 .  rosuvastatin (CRESTOR) 40 MG tablet, Take 1 tablet (40 mg total) by mouth daily., Disp: 90 tablet, Rfl: 0 .  triamterene-hydrochlorothiazide (MAXZIDE-25) 37.5-25 MG tablet, Take 1 tablet by mouth daily., Disp: 90 tablet, Rfl: 1 .  ezetimibe (ZETIA) 10 MG tablet, Take 1 tablet (10 mg total) by mouth daily., Disp: 90 tablet, Rfl: 1 .  metoprolol succinate (TOPROL-XL) 25 MG 24 hr tablet, Take 1 tablet (25 mg total) by mouth daily. Take with or immediately following a meal., Disp: 90 tablet, Rfl: 1  No Known Allergies   ROS  Constitutional: Negative for fever or weight change.  Respiratory: Negative for cough and shortness of  breath.   Cardiovascular: Negative for chest pain or palpitations.  Gastrointestinal: Negative for abdominal pain, no bowel changes.  Musculoskeletal: Negative for gait problem or joint swelling.  Skin: Negative for rash.  Neurological: Negative for dizziness or headache.  No other specific complaints in a complete review of systems (except as listed in HPI above).  Objective  Vitals:   06/28/18 1037 06/28/18 1123  BP: (!) 152/72 (!) 148/64  Pulse: 91   Resp: 16   Temp: 98.1 F (36.7 C)   TempSrc: Oral   SpO2: 96%   Weight: 144 lb 14.4 oz (65.7 kg)   Height: 5\' 6"  (1.676 m)     Body mass index is 23.39 kg/m.  Physical Exam  Constitutional: Patient appears well-developed   No distress.  HEENT: head atraumatic, normocephalic, pupils equal and reactive to light,  neck supple, throat within normal limits Cardiovascular: Normal rate, regular rhythm and normal heart sounds.  No murmur heard. No BLE edema. Pulmonary/Chest: Effort normal and breath sounds normal. No respiratory distress. Abdominal: Soft.  There is no tenderness. Psychiatric: Patient has a normal mood and affect. behavior is normal. Judgment and thought content normal.  Recent Results (from the past 2160 hour(s))  Hemoglobin A1c     Status: Abnormal   Collection Time: 05/09/18 11:55 AM  Result Value Ref Range   Hgb A1c MFr Bld 6.1 (H) <5.7 % of total Hgb    Comment: For someone without known diabetes, a hemoglobin  A1c value between 5.7% and 6.4% is consistent with prediabetes and should be confirmed with a  follow-up test. . For someone with known diabetes, a value <7% indicates that their diabetes is well controlled. A1c targets should be individualized based on duration of diabetes, age, comorbid conditions, and other considerations. . This assay result is consistent with an increased risk of diabetes. . Currently, no consensus exists regarding use of hemoglobin A1c for diagnosis of diabetes for  children. .    Mean Plasma Glucose 128 (calc)   eAG (mmol/L) 7.1 (calc)  COMPLETE METABOLIC PANEL WITH GFR     Status: Abnormal   Collection Time: 05/09/18 11:55 AM  Result Value Ref Range   Glucose, Bld 84 65 - 139 mg/dL    Comment: .        Non-fasting reference interval .    BUN 15 7 - 25 mg/dL   Creat 1.39 (H) 0.70 - 1.18 mg/dL    Comment: For patients >32 years of age, the reference limit for Creatinine is approximately 13% higher for people identified as African-American. .    GFR,  Est Non African American 49 (L) > OR = 60 mL/min/1.28m2   GFR, Est African American 57 (L) > OR = 60 mL/min/1.15m2   BUN/Creatinine Ratio 11 6 - 22 (calc)   Sodium 137 135 - 146 mmol/L   Potassium 4.6 3.5 - 5.3 mmol/L   Chloride 100 98 - 110 mmol/L   CO2 29 20 - 32 mmol/L   Calcium 9.9 8.6 - 10.3 mg/dL   Total Protein 7.1 6.1 - 8.1 g/dL   Albumin 4.0 3.6 - 5.1 g/dL   Globulin 3.1 1.9 - 3.7 g/dL (calc)   AG Ratio 1.3 1.0 - 2.5 (calc)   Total Bilirubin 0.4 0.2 - 1.2 mg/dL   Alkaline phosphatase (APISO) 79 40 - 115 U/L   AST 13 10 - 35 U/L   ALT 11 9 - 46 U/L  Lipid panel     Status: Abnormal   Collection Time: 05/09/18 11:55 AM  Result Value Ref Range   Cholesterol 168 <200 mg/dL   HDL 38 (L) >40 mg/dL   Triglycerides 147 <150 mg/dL   LDL Cholesterol (Calc) 104 (H) mg/dL (calc)    Comment: Reference range: <100 . Desirable range <100 mg/dL for primary prevention;   <70 mg/dL for patients with CHD or diabetic patients  with > or = 2 CHD risk factors. Marland Kitchen LDL-C is now calculated using the Martin-Hopkins  calculation, which is a validated novel method providing  better accuracy than the Friedewald equation in the  estimation of LDL-C.  Cresenciano Genre et al. Annamaria Helling. 9833;825(05): 2061-2068  (http://education.[facility].com/faq/FAQ164)    Total CHOL/HDL Ratio 4.4 <5.0 (calc)   Non-HDL Cholesterol (Calc) 130 (H) <130 mg/dL (calc)    Comment: For patients with diabetes plus 1 major ASCVD  risk  factor, treating to a non-HDL-C goal of <100 mg/dL  (LDL-C of <70 mg/dL) is considered a therapeutic  option.      PHQ2/9: Depression screen [facility] 2/9 06/28/2018 05/09/2018 11/02/2017 05/08/2017 05/03/2017  Decreased Interest 0 0 0 3 0  Down, Depressed, Hopeless 0 0 0 0 0  PHQ - 2 Score 0 0 0 3 0  Altered sleeping 0 0 - 2 -  Tired, decreased energy 0 2 - 3 -  Change in appetite 0 0 - 0 -  Feeling bad or failure about yourself  0 0 - 0 -  Trouble concentrating 0 0 - 0 -  Moving slowly or fidgety/restless 0 0 - 1 -  Suicidal thoughts - 0 - 0 -  PHQ-9 Score 0 2 - 9 -  Difficult doing work/chores - - - Somewhat difficult -     Fall Risk: Fall Risk  06/28/2018 05/09/2018 02/07/2018 02/07/2018 11/02/2017  Falls in the past year? No No No No No     Functional Status Survey: Is the patient deaf or have difficulty hearing?: Yes(Trouble hearing out of right ear) Does the patient have difficulty seeing, even when wearing glasses/contacts?: Yes Does the patient have difficulty concentrating, remembering, or making decisions?: No Does the patient have difficulty walking or climbing stairs?: No Does the patient have difficulty dressing or bathing?: No Does the patient have difficulty doing errands alone such as visiting a doctor's office or shopping?: No    Assessment & Plan  1. Atherosclerosis of aorta [facility])  Discussed medical management, adding zetia to crestor and continue aspirin 81 mg  2. Coronary artery calcification seen on CAT scan  Not having any symptoms of heart disease at this time, we will continue with medication  management  -EKG  3. Centrilobular emphysema ([facility])  Only has SOB with strenuous activity , he does not want medication he will try to quit smoking  4. Need for immunization against influenza  - Flu vaccine HIGH DOSE PF (Fluzone High dose)  5. Chronic kidney disease, stage III (moderate) ([facility])  Discussed importance of avoiding NSAID's  6. Pure  hypercholesterolemia  We will add Zetia, goal LDL is below 70 - rosuvastatin (CRESTOR) 40 MG tablet; Take 1 tablet (40 mg total) by mouth daily.  Dispense: 90 tablet; Refill: 0 - ezetimibe (ZETIA) 10 MG tablet; Take 1 tablet (10 mg total) by mouth daily.  Dispense: 90 tablet; Refill: 1  7. Essential hypertension  At goal   8. Hyperglycemia  Discussed low carbohydrate diet   9. Tobacco abuse  Offered medication to help him quit, however he states he has quit smoking before and can do it on his own

## 2018-07-16 ENCOUNTER — Ambulatory Visit: Payer: Medicare PPO | Admitting: Family Medicine

## 2018-10-27 ENCOUNTER — Other Ambulatory Visit: Payer: Self-pay | Admitting: Family Medicine

## 2018-10-27 DIAGNOSIS — E7800 Pure hypercholesterolemia, unspecified: Secondary | ICD-10-CM

## 2018-11-09 ENCOUNTER — Other Ambulatory Visit: Payer: Self-pay | Admitting: Family Medicine

## 2018-11-09 ENCOUNTER — Ambulatory Visit: Payer: Medicare PPO | Admitting: Family Medicine

## 2018-11-09 DIAGNOSIS — I10 Essential (primary) hypertension: Secondary | ICD-10-CM

## 2018-12-25 ENCOUNTER — Other Ambulatory Visit: Payer: Self-pay | Admitting: Family Medicine

## 2018-12-25 DIAGNOSIS — E7800 Pure hypercholesterolemia, unspecified: Secondary | ICD-10-CM

## 2018-12-25 NOTE — Telephone Encounter (Signed)
He is due for 6 month follow up, please schedule him for this Friday

## 2018-12-25 NOTE — Telephone Encounter (Signed)
Not able to leave message. Pt need to be seen and Dr Ancil Boozer would like to schedule for this Friday if possible

## 2019-01-28 ENCOUNTER — Other Ambulatory Visit: Payer: Self-pay | Admitting: Family Medicine

## 2019-01-28 DIAGNOSIS — E7800 Pure hypercholesterolemia, unspecified: Secondary | ICD-10-CM

## 2019-01-28 NOTE — Telephone Encounter (Signed)
Refill Request for Cholesterol medication. Crestor 40 mg   Lab Results  Component Value Date   CHOL 168 05/09/2018   HDL 38 (L) 05/09/2018   LDLCALC 104 (H) 05/09/2018   TRIG 147 05/09/2018   CHOLHDL 4.4 05/09/2018    Follow up:  None indicated

## 2019-01-29 NOTE — Telephone Encounter (Signed)
Pt doing home appt on 02-06-2019 AT 1:20

## 2019-01-29 NOTE — Telephone Encounter (Signed)
Done

## 2019-02-01 ENCOUNTER — Other Ambulatory Visit: Payer: Self-pay | Admitting: Family Medicine

## 2019-02-01 DIAGNOSIS — I10 Essential (primary) hypertension: Secondary | ICD-10-CM

## 2019-02-01 NOTE — Telephone Encounter (Signed)
Refill request for Hypertension medication:  Triamterene-HCTZ 37.5-25 mg  Last office visit pertaining to hypertension: 06/28/2018  BP Readings from Last 3 Encounters:  06/28/18 (!) 148/64  05/09/18 138/82  02/07/18 140/80   Lab Results  Component Value Date   CREATININE 1.39 (H) 05/09/2018   BUN 15 05/09/2018   NA 137 05/09/2018   K 4.6 05/09/2018   CL 100 05/09/2018   CO2 29 05/09/2018   Follow-ups on file. 02/06/2019

## 2019-02-01 NOTE — Telephone Encounter (Signed)
Copied from [place] [id number]. Topic: Quick Communication - Rx Refill/Question >> Feb 01, 2019 11:44 AM Percell Belt A wrote: Medication: triamterene-hydrochlorothiazide (MAXZIDE-25) 37.5-25 MG tablet   Has the patient contacted their pharmacy? No  (Agent: If no, request that the patient contact the pharmacy for the refill.) (Agent: If yes, when and what did the pharmacy advise?)  Preferred Pharmacy (with phone number or street name): [id number] [place], [place] [address] [phone] (Phone)   Agent: Please be advised that RX refills may take up to 3 business days. We ask that you follow-up with your pharmacy.

## 2019-02-06 ENCOUNTER — Other Ambulatory Visit: Payer: Self-pay

## 2019-02-06 ENCOUNTER — Ambulatory Visit (INDEPENDENT_AMBULATORY_CARE_PROVIDER_SITE_OTHER): Payer: Medicare HMO | Admitting: Family Medicine

## 2019-02-06 ENCOUNTER — Encounter: Payer: Self-pay | Admitting: Family Medicine

## 2019-02-06 DIAGNOSIS — N183 Chronic kidney disease, stage 3 unspecified: Secondary | ICD-10-CM

## 2019-02-06 DIAGNOSIS — I2510 Atherosclerotic heart disease of native coronary artery without angina pectoris: Secondary | ICD-10-CM | POA: Diagnosis not present

## 2019-02-06 DIAGNOSIS — R42 Dizziness and giddiness: Secondary | ICD-10-CM

## 2019-02-06 DIAGNOSIS — Z720 Tobacco use: Secondary | ICD-10-CM

## 2019-02-06 DIAGNOSIS — I700 Atherosclerosis of aorta: Secondary | ICD-10-CM | POA: Diagnosis not present

## 2019-02-06 DIAGNOSIS — I10 Essential (primary) hypertension: Secondary | ICD-10-CM

## 2019-02-06 DIAGNOSIS — E7800 Pure hypercholesterolemia, unspecified: Secondary | ICD-10-CM

## 2019-02-06 DIAGNOSIS — J432 Centrilobular emphysema: Secondary | ICD-10-CM

## 2019-02-06 MED ORDER — TRIAMTERENE-HCTZ 37.5-25 MG PO TABS: 1.0000 | ORAL_TABLET | Freq: Every day | ORAL | 1 refills | Status: DC

## 2019-02-06 MED ORDER — ROSUVASTATIN CALCIUM 40 MG PO TABS: 40.0000 mg | ORAL_TABLET | Freq: Every day | ORAL | 1 refills | Status: DC

## 2019-02-06 MED ORDER — EZETIMIBE 10 MG PO TABS: 10.0000 mg | ORAL_TABLET | Freq: Every day | ORAL | 1 refills | Status: DC

## 2019-02-06 MED ORDER — METOPROLOL SUCCINATE ER 25 MG PO TB24: 25.0000 mg | ORAL_TABLET | Freq: Every day | ORAL | 1 refills | Status: DC

## 2019-02-06 NOTE — Progress Notes (Signed)
Name: GURTEJ NOYOLA   MRN: [id number]    DOB: Mar 04, 1941   Date:02/06/2019       Progress Note  Subjective  Chief Complaint  No chief complaint on file.   I connected with  [name] on 02/06/19 at  1:20 PM EDT by telephone and verified that I am speaking with the correct person using two identifiers.  I discussed the limitations, risks, security and privacy concerns of performing an evaluation and management service by telephone and the availability of in person appointments. Staff also discussed with the patient that there may be a patient responsible charge related to this service. Patient Location: at home  Provider Location: [facility] Additional Individuals present: girlfriend  HPI  Emphysema: only has sob with moderate activity, he is smoking half pack daily, he has been smoking for the past 60 years. He has a smokers cough. Cough is usually dry. He denies wheezing   CKI stage III: discussed avoiding nsaid's, we will monitor for now. No symptoms. Last GFR was 57. He is now only taking Tylenol prn pain.    CAD 3 vessels and aorta atherosclerosis. On CT chest done 06/2018  He denies chest pain or decrease in exercise tolerance. He is on high dose statin and is also taking Zetia. He forgot to get aspirin the last time he went to pharmacy  Hyperglycemia, hgbA1C went up from 5.0% to 6.1%, he denies polyphagia, polyuria or polydipsia. After COVID-19 we will recheck labs He is cutting down on sweets   HTN: he is out of Maxzide and needs a refill, unable to check bp home, occasionally gets vertigo but only takes meclizine prn and it resolves with medication. No chest pain or palpitation    Patient Active Problem List   Diagnosis Date Noted  . Coronary artery calcification seen on CAT scan 06/28/2018  . Centrilobular emphysema ([place]) 06/28/2018  . Chronic kidney disease, stage III (moderate) ([place]) 06/28/2018  . Atherosclerosis of aorta ([place]) 06/19/2018   . Chronic low back pain with sciatica 02/07/2018  . Hyperglycemia 05/03/2017  . Simple chronic bronchitis ([place]) 05/07/2015  . Essential hypertension 05/07/2015  . Hyperlipidemia 05/07/2015  . Tobacco abuse 05/07/2015    History reviewed. No pertinent surgical history.  Family History  Problem Relation Age of Onset  . Heart disease Mother   . COPD Father     Social History   Socioeconomic History  . Marital status: Single    Spouse name: Not on file  . Number of children: 1  . Years of education: Not on file  . Highest education level: High school graduate  Occupational History  . Occupation: retired   Scientific laboratory technician  . Financial resource strain: Not hard at all  . Food insecurity:    Worry: Never true    Inability: Never true  . Transportation needs:    Medical: No    Non-medical: No  Tobacco Use  . Smoking status: Current Every Day Smoker    Packs/day: 0.50    Years: 60.00    Pack years: 30.00    Types: Cigarettes  . Smokeless tobacco: Never Used  Substance and Sexual Activity  . Alcohol use: Yes    Alcohol/week: 0.0 standard drinks    Comment: occasional beer (1)  . Drug use: No  . Sexual activity: Yes  Lifestyle  . Physical activity:    Days per week: 0 days    Minutes per session: 0 min  . Stress: Not at  all  Relationships  . Social connections:    Talks on phone: Once a week    Gets together: Never    Attends religious service: 1 to 4 times per year    Active member of club or organization: No    Attends meetings of clubs or organizations: Never    Relationship status: Never married  . Intimate partner violence:    Fear of current or ex partner: No    Emotionally abused: No    Physically abused: No    Forced sexual activity: No  Other Topics Concern  . Not on file  Social History Narrative   Single, lives with male friend for the past 44 plus years.      Current Outpatient Medications:  .  acetaminophen (TYLENOL) 500 MG tablet, Take 1  tablet (500 mg total) by mouth every 6 (six) hours as needed., Disp: 30 tablet, Rfl: 0 .  aspirin 81 MG chewable tablet, Chew 81 mg by mouth daily., Disp: , Rfl:  .  ezetimibe (ZETIA) 10 MG tablet, Take 1 tablet (10 mg total) by mouth daily., Disp: 90 tablet, Rfl: 1 .  meclizine (ANTIVERT) 25 MG tablet, Take 1 tablet (25 mg total) by mouth daily as needed for dizziness., Disp: 30 tablet, Rfl: 2 .  metoprolol succinate (TOPROL-XL) 25 MG 24 hr tablet, Take 1 tablet (25 mg total) by mouth daily. Take with or immediately following a meal., Disp: 90 tablet, Rfl: 1 .  rosuvastatin (CRESTOR) 40 MG tablet, TAKE 1 TABLET BY MOUTH EVERY DAY, Disp: 30 tablet, Rfl: 0 .  triamterene-hydrochlorothiazide (MAXZIDE-25) 37.5-25 MG tablet, Take 1 tablet by mouth daily., Disp: 90 tablet, Rfl: 1  No Known Allergies  I personally reviewed active problem list, medication list, allergies, family history, social history with the patient/caregiver today.   ROS  Ten systems reviewed and is negative except as mentioned in HPI    Objective  Virtual encounter, vitals not obtained.  Physical Exam  Strong voice, no distress, awake and oriented  PHQ2/9: Depression screen [facility] 2/9 02/06/2019 06/28/2018 05/09/2018 11/02/2017 05/08/2017  Decreased Interest 0 0 0 0 3  Down, Depressed, Hopeless 0 0 0 0 0  PHQ - 2 Score 0 0 0 0 3  Altered sleeping 0 0 0 - 2  Tired, decreased energy 0 0 2 - 3  Change in appetite 0 0 0 - 0  Feeling bad or failure about yourself  0 0 0 - 0  Trouble concentrating 0 0 0 - 0  Moving slowly or fidgety/restless 0 0 0 - 1  Suicidal thoughts 0 - 0 - 0  PHQ-9 Score 0 0 2 - 9  Difficult doing work/chores - - - - Somewhat difficult   PHQ-2/9 Result is negative.    Fall Risk: Fall Risk  02/06/2019 06/28/2018 05/09/2018 02/07/2018 02/07/2018  Falls in the past year? 0 No No No No  Number falls in past yr: 0 - - - -  Injury with Fall? 0 - - - -    Assessment & Plan    1. Coronary artery  calcification seen on CAT scan  On statin, zetia, beta-blocker   2. Pure hypercholesterolemia  - ezetimibe (ZETIA) 10 MG tablet; Take 1 tablet (10 mg total) by mouth daily.  Dispense: 90 tablet; Refill: 1 - rosuvastatin (CRESTOR) 40 MG tablet; Take 1 tablet (40 mg total) by mouth daily.  Dispense: 90 tablet; Refill: 1  3. Vertigo  Sporadic and stable  4. Essential hypertension  -  metoprolol succinate (TOPROL-XL) 25 MG 24 hr tablet; Take 1 tablet (25 mg total) by mouth daily. Take with or immediately following a meal.  Dispense: 90 tablet; Refill: 1 - triamterene-hydrochlorothiazide (MAXZIDE-25) 37.5-25 MG tablet; Take 1 tablet by mouth daily.  Dispense: 90 tablet; Refill: 1  5. Atherosclerosis of aorta (HCC)  On statin, and needs to resume baby aspirin daily   6. Centrilobular emphysema ([place])  He does not want medications at this time  7. Tobacco abuse  Discussed importance of continue going down on cigarettes   8. Chronic kidney disease, stage III (moderate) (HCC)   I discussed the assessment and treatment plan with the patient. The patient was provided an opportunity to ask questions and all were answered. The patient agreed with the plan and demonstrated an understanding of the instructions.   The patient was advised to call back or seek an in-person evaluation if the symptoms worsen or if the condition fails to improve as anticipated.  I provided 21 minutes of non-face-to-face time during this encounter.  Loistine Chance, MD

## 2019-02-14 ENCOUNTER — Encounter: Payer: Self-pay | Admitting: Family Medicine

## 2019-06-11 ENCOUNTER — Other Ambulatory Visit: Payer: Self-pay

## 2019-06-11 ENCOUNTER — Ambulatory Visit (INDEPENDENT_AMBULATORY_CARE_PROVIDER_SITE_OTHER): Payer: Medicare HMO | Admitting: Family Medicine

## 2019-06-11 ENCOUNTER — Encounter: Payer: Self-pay | Admitting: Family Medicine

## 2019-06-11 DIAGNOSIS — I700 Atherosclerosis of aorta: Secondary | ICD-10-CM

## 2019-06-11 DIAGNOSIS — N183 Chronic kidney disease, stage 3 (moderate): Secondary | ICD-10-CM | POA: Diagnosis not present

## 2019-06-11 DIAGNOSIS — J410 Simple chronic bronchitis: Secondary | ICD-10-CM

## 2019-06-11 DIAGNOSIS — J432 Centrilobular emphysema: Secondary | ICD-10-CM | POA: Diagnosis not present

## 2019-06-11 DIAGNOSIS — I2510 Atherosclerotic heart disease of native coronary artery without angina pectoris: Secondary | ICD-10-CM

## 2019-06-11 DIAGNOSIS — I10 Essential (primary) hypertension: Secondary | ICD-10-CM

## 2019-06-11 DIAGNOSIS — R739 Hyperglycemia, unspecified: Secondary | ICD-10-CM | POA: Diagnosis not present

## 2019-06-11 DIAGNOSIS — E7800 Pure hypercholesterolemia, unspecified: Secondary | ICD-10-CM | POA: Diagnosis not present

## 2019-06-11 DIAGNOSIS — N1831 Chronic kidney disease, stage 3a: Secondary | ICD-10-CM

## 2019-06-11 MED ORDER — TRIAMTERENE-HCTZ 37.5-25 MG PO TABS: 1.0000 | ORAL_TABLET | Freq: Every day | ORAL | 1 refills | Status: DC

## 2019-06-11 MED ORDER — EZETIMIBE 10 MG PO TABS: 10.0000 mg | ORAL_TABLET | Freq: Every day | ORAL | 1 refills | Status: DC

## 2019-06-11 MED ORDER — ROSUVASTATIN CALCIUM 40 MG PO TABS: 40.0000 mg | ORAL_TABLET | Freq: Every day | ORAL | 1 refills | Status: DC

## 2019-06-11 MED ORDER — METOPROLOL SUCCINATE ER 25 MG PO TB24: 25.0000 mg | ORAL_TABLET | Freq: Every day | ORAL | 1 refills | Status: DC

## 2019-06-11 NOTE — Progress Notes (Addendum)
Name: Eric [name]   MRN: [id number]    DOB: 29-Jan-1941   Date:06/11/2019       Progress Note  Subjective  Chief Complaint  No chief complaint on file.   I connected with  Eric [name] on 06/11/19 at  9:00 AM EDT by telephone and verified that I am speaking with the correct person using two identifiers.  I discussed the limitations, risks, security and privacy concerns of performing an evaluation and management service by telephone and the availability of in person appointments. Staff also discussed with the patient that there may be a patient responsible charge related to this service. Patient Location: at home Provider Location: [facility]   HPI  Emphysema: only has sob with moderate activity, he is smoking half pack daily, he has been smoking for the past 60 plus  years. He has a smokers cough. Cough is usually dry. He denies wheezing He is still able to take care of his yard, push and riding loan mower   CKI stage III: discussed avoiding nsaid's, we will monitor for now. No symptoms. Last GFR was 57. He is now only taking Tylenol prn pain , very seldom less than once a week  CAD 3 vessels and aorta atherosclerosis. On CT chest done 06/2018  He denies chest pain or decrease in exercise tolerance. He is on high dose statin and is also taking Zetia. He has been taking 81 mg aspirin since last visit, denies bleeding   Hyperglycemia, hgbA1C went up from 5.0% to 6.1%, he denies polyphagia, polyuria or polydipsia. He will return for labs the day he comes in for flu vaccine  HTN: he is out of Maxzide and needs a refill, unable to check bp home, occasionally gets vertigo but only takes meclizine prn and it resolves with medication. No chest pain or palpitation    Patient Active Problem List   Diagnosis Date Noted  . Coronary artery calcification seen on CAT scan 06/28/2018  . Centrilobular emphysema ([place]) 06/28/2018  . Chronic kidney disease, stage III  (moderate) ([place]) 06/28/2018  . Atherosclerosis of aorta ([place]) 06/19/2018  . Chronic low back pain with sciatica 02/07/2018  . Hyperglycemia 05/03/2017  . Simple chronic bronchitis ([place]) 05/07/2015  . Essential hypertension 05/07/2015  . Hyperlipidemia 05/07/2015  . Tobacco abuse 05/07/2015    History reviewed. No pertinent surgical history.  Family History  Problem Relation Age of Onset  . Heart disease Mother   . COPD Father     Social History   Socioeconomic History  . Marital status: Single    Spouse name: Not on file  . Number of children: 1  . Years of education: Not on file  . Highest education level: High school graduate  Occupational History  . Occupation: retired   Scientific laboratory technician  . Financial resource strain: Not hard at all  . Food insecurity    Worry: Never true    Inability: Never true  . Transportation needs    Medical: No    Non-medical: No  Tobacco Use  . Smoking status: Current Every Day Smoker    Packs/day: 0.50    Years: 60.00    Pack years: 30.00    Types: Cigarettes  . Smokeless tobacco: Never Used  Substance and Sexual Activity  . Alcohol use: Yes    Alcohol/week: 0.0 standard drinks    Comment: occasional beer (1)  . Drug use: No  . Sexual activity: Yes  Lifestyle  . Physical activity  Days per week: 0 days    Minutes per session: 0 min  . Stress: Not at all  Relationships  . Social Herbalist on phone: Once a week    Gets together: Never    Attends religious service: 1 to 4 times per year    Active member of club or organization: No    Attends meetings of clubs or organizations: Never    Relationship status: Never married  . Intimate partner violence    Fear of current or ex partner: No    Emotionally abused: No    Physically abused: No    Forced sexual activity: No  Other Topics Concern  . Not on file  Social History Narrative   Single, lives with male friend for the past 61 plus years.      Current  Outpatient Medications:  .  acetaminophen (TYLENOL) 500 MG tablet, Take 1 tablet (500 mg total) by mouth every 6 (six) hours as needed., Disp: 30 tablet, Rfl: 0 .  aspirin 81 MG chewable tablet, Chew 81 mg by mouth daily., Disp: , Rfl:  .  ezetimibe (ZETIA) 10 MG tablet, Take 1 tablet (10 mg total) by mouth daily., Disp: 90 tablet, Rfl: 1 .  meclizine (ANTIVERT) 25 MG tablet, Take 1 tablet (25 mg total) by mouth daily as needed for dizziness., Disp: 30 tablet, Rfl: 2 .  metoprolol succinate (TOPROL-XL) 25 MG 24 hr tablet, Take 1 tablet (25 mg total) by mouth daily. Take with or immediately following a meal., Disp: 90 tablet, Rfl: 1 .  rosuvastatin (CRESTOR) 40 MG tablet, Take 1 tablet (40 mg total) by mouth daily., Disp: 90 tablet, Rfl: 1 .  triamterene-hydrochlorothiazide (MAXZIDE-25) 37.5-25 MG tablet, Take 1 tablet by mouth daily., Disp: 90 tablet, Rfl: 1  No Known Allergies  I personally reviewed active problem list, medication list, allergies, family history, social history with the patient/caregiver today.   ROS  Ten systems reviewed and is negative except as mentioned in HPI   Objective  Virtual encounter, vitals not obtained.  There is no height or weight on file to calculate BMI.  Physical Exam  Awake, alert and oriented   PHQ2/9: Depression screen [facility] 2/9 06/11/2019 02/06/2019 06/28/2018 05/09/2018 11/02/2017  Decreased Interest 0 0 0 0 0  Down, Depressed, Hopeless 0 0 0 0 0  PHQ - 2 Score 0 0 0 0 0  Altered sleeping 0 0 0 0 -  Tired, decreased energy 0 0 0 2 -  Change in appetite 0 0 0 0 -  Feeling bad or failure about yourself  0 0 0 0 -  Trouble concentrating 0 0 0 0 -  Moving slowly or fidgety/restless 0 0 0 0 -  Suicidal thoughts 0 0 - 0 -  PHQ-9 Score 0 0 0 2 -  Difficult doing work/chores - - - - -   PHQ-2/9 Result is negative.    Fall Risk: Fall Risk  06/11/2019 02/06/2019 06/28/2018 05/09/2018 02/07/2018  Falls in the past year? 0 0 No No No  Number falls in  past yr: 0 0 - - -  Injury with Fall? 0 0 - - -     Assessment & Plan  1. Pure hypercholesterolemia  - ezetimibe (ZETIA) 10 MG tablet; Take 1 tablet (10 mg total) by mouth daily.  Dispense: 90 tablet; Refill: 1 - rosuvastatin (CRESTOR) 40 MG tablet; Take 1 tablet (40 mg total) by mouth daily.  Dispense: 90 tablet; Refill: 1 -  Lipid panel  2. Essential hypertension  - metoprolol succinate (TOPROL-XL) 25 MG 24 hr tablet; Take 1 tablet (25 mg total) by mouth daily. Take with or immediately following a meal.  Dispense: 90 tablet; Refill: 1 - triamterene-hydrochlorothiazide (MAXZIDE-25) 37.5-25 MG tablet; Take 1 tablet by mouth daily.  Dispense: 90 tablet; Refill: 1 - CBC with Differential/Platelet - COMPLETE METABOLIC PANEL WITH GFR  3. Hyperglycemia  - Hemoglobin A1c   4. Coronary artery calcification seen on CAT scan   5. Centrilobular emphysema (HCC)  Not ready to quit   6. Atherosclerosis of aorta (HCC)  On statin therapy and aspirin   7. Simple chronic bronchitis (HCC)  stable  8. Stage 3a chronic kidney disease  I discussed the assessment and treatment plan with the patient. The patient was provided an opportunity to ask questions and all were answered. The patient agreed with the plan and demonstrated an understanding of the instructions.   The patient was advised to call back or seek an in-person evaluation if the symptoms worsen or if the condition fails to improve as anticipated.  I provided 25 minutes of non-face-to-face time during this encounter.  Loistine Chance, MD

## 2019-06-27 ENCOUNTER — Telehealth: Payer: Self-pay | Admitting: *Deleted

## 2019-06-27 DIAGNOSIS — Z87891 Personal history of nicotine dependence: Secondary | ICD-10-CM

## 2019-06-27 DIAGNOSIS — Z122 Encounter for screening for malignant neoplasm of respiratory organs: Secondary | ICD-10-CM

## 2019-06-27 NOTE — Telephone Encounter (Signed)
Patient has been notified that annual lung cancer screening low dose CT scan is due currently or will be in near future. Confirmed that patient is within the age range of 55-77, and asymptomatic, (no signs or symptoms of lung cancer). Patient denies illness that would prevent curative treatment for lung cancer if found. Verified smoking history, (current, 30.5 pack year). The shared decision making visit was done 06/19/18. Patient is agreeable for CT scan being scheduled.

## 2019-07-02 ENCOUNTER — Other Ambulatory Visit: Payer: Self-pay

## 2019-07-02 ENCOUNTER — Ambulatory Visit
Admission: RE | Admit: 2019-07-02 | Discharge: 2019-07-02 | Disposition: A | Discharge status: Home / Self Care | Payer: Medicare HMO | Source: Ambulatory Visit | Attending: Oncology | Admitting: Oncology

## 2019-07-02 DIAGNOSIS — Z122 Encounter for screening for malignant neoplasm of respiratory organs: Secondary | ICD-10-CM | POA: Insufficient documentation

## 2019-07-02 DIAGNOSIS — Z87891 Personal history of nicotine dependence: Secondary | ICD-10-CM

## 2019-07-02 DIAGNOSIS — F17210 Nicotine dependence, cigarettes, uncomplicated: Secondary | ICD-10-CM | POA: Diagnosis not present

## 2019-07-04 ENCOUNTER — Encounter: Payer: Self-pay | Admitting: *Deleted

## 2019-08-20 ENCOUNTER — Other Ambulatory Visit: Payer: Self-pay

## 2019-08-20 ENCOUNTER — Ambulatory Visit (INDEPENDENT_AMBULATORY_CARE_PROVIDER_SITE_OTHER): Payer: Medicare HMO

## 2019-08-20 DIAGNOSIS — Z0000 Encounter for general adult medical examination without abnormal findings: Secondary | ICD-10-CM

## 2019-08-20 NOTE — Progress Notes (Signed)
Subjective:   Eric [name] is a 78 y.o. male who presents for Medicare Annual/Subsequent preventive examination.  Virtual Visit via Telephone Note  I connected with [name] on 08/20/19 at  1:30 PM EDT by telephone and verified that I am speaking with the correct person using two identifiers.  Medicare Annual Wellness visit completed telephonically due to Covid-19 pandemic.   Location: Patient: home Provider: office   I discussed the limitations, risks, security and privacy concerns of performing an evaluation and management service by telephone and the availability of in person appointments. The patient expressed understanding and agreed to proceed.  Some vital signs may be absent or patient reported.   Clemetine Marker, LPN    Review of Systems:   Cardiac Risk Factors include: advanced age (>35men, >57 women);smoking/ tobacco exposure;dyslipidemia;hypertension;male gender     Objective:    Vitals: There were no vitals taken for this visit.  There is no height or weight on file to calculate BMI.  Advanced Directives 08/20/2019 05/08/2017 05/03/2017 03/16/2017 08/13/2015 05/07/2015  Does Patient Have a Medical Advance Directive? No No No No No No  Would patient like information on creating a medical advance directive? Yes (MAU/Ambulatory/Procedural Areas - Information given) No - Patient declined - - No - patient declined information No - patient declined information    Tobacco Social History   Tobacco Use  Smoking Status Current Every Day Smoker  . Packs/day: 0.50  . Years: 60.00  . Pack years: 30.00  . Types: Cigarettes  Smokeless Tobacco Never Used     Ready to quit: Yes Counseling given: Yes   Clinical Intake:  Pre-visit preparation completed: Yes  Pain : No/denies pain     Nutritional Risks: None Diabetes: No  How often do you need to have someone help you when you read instructions, pamphlets, or other written materials from your doctor or  pharmacy?: 1 - Never  Interpreter Needed?: No  Information entered by :: Clemetine Marker LPN  Past Medical History:  Diagnosis Date  . Hyperlipidemia   . Hypertension    History reviewed. No pertinent surgical history. Family History  Problem Relation Age of Onset  . Heart disease Mother   . COPD Father    Social History   Socioeconomic History  . Marital status: Single    Spouse name: Not on file  . Number of children: 1  . Years of education: Not on file  . Highest education level: High school graduate  Occupational History  . Occupation: retired   Scientific laboratory technician  . Financial resource strain: Not hard at all  . Food insecurity    Worry: Never true    Inability: Never true  . Transportation needs    Medical: No    Non-medical: No  Tobacco Use  . Smoking status: Current Every Day Smoker    Packs/day: 0.50    Years: 60.00    Pack years: 30.00    Types: Cigarettes  . Smokeless tobacco: Never Used  Substance and Sexual Activity  . Alcohol use: Yes    Alcohol/week: 0.0 standard drinks    Comment: occasional beer (1)  . Drug use: No  . Sexual activity: Yes  Lifestyle  . Physical activity    Days per week: 0 days    Minutes per session: 0 min  . Stress: Not at all  Relationships  . Social connections    Talks on phone: Once a week    Gets together: Never  Attends religious service: 1 to 4 times per year    Active member of club or organization: No    Attends meetings of clubs or organizations: Never    Relationship status: Never married  Other Topics Concern  . Not on file  Social History Narrative   Single, lives with male friend for the past 41 plus years.     Outpatient Encounter Medications as of 08/20/2019  Medication Sig  . acetaminophen (TYLENOL) 500 MG tablet Take 1 tablet (500 mg total) by mouth every 6 (six) hours as needed.  Marland Kitchen aspirin 81 MG chewable tablet Chew 81 mg by mouth daily.  Marland Kitchen ezetimibe (ZETIA) 10 MG tablet Take 1 tablet (10 mg total)  by mouth daily.  . meclizine (ANTIVERT) 25 MG tablet Take 1 tablet (25 mg total) by mouth daily as needed for dizziness.  . metoprolol succinate (TOPROL-XL) 25 MG 24 hr tablet Take 1 tablet (25 mg total) by mouth daily. Take with or immediately following a meal.  . rosuvastatin (CRESTOR) 40 MG tablet Take 1 tablet (40 mg total) by mouth daily.  Marland Kitchen triamterene-hydrochlorothiazide (MAXZIDE-25) 37.5-25 MG tablet Take 1 tablet by mouth daily.   No facility-administered encounter medications on file as of 08/20/2019.     Activities of Daily Living In your present state of health, do you have any difficulty performing the following activities: 08/20/2019 06/11/2019  Hearing? N N  Comment declines hearing aids -  Vision? N N  Difficulty concentrating or making decisions? N N  Walking or climbing stairs? N N  Dressing or bathing? N N  Doing errands, shopping? N N  Preparing Food and eating ? N -  Using the Toilet? N -  In the past six months, have you accidently leaked urine? Y -  Comment wears pads/depends for protection -  Do you have problems with loss of bowel control? N -  Managing your Medications? N -  Managing your Finances? N -  Housekeeping or managing your Housekeeping? N -  Some recent data might be hidden    Patient Care Team: Steele Sizer, MD as PCP - General (Family Medicine)   Assessment:   This is a routine wellness examination for [name].  Exercise Activities and Dietary recommendations Current Exercise Habits: The patient does not participate in regular exercise at present, Exercise limited by: None identified  Goals    . Increase water intake     Recommend increasing water intake to 4-6 glasses of water a day.        Fall Risk Fall Risk  08/20/2019 06/11/2019 02/06/2019 06/28/2018 05/09/2018  Falls in the past year? 0 0 0 No No  Number falls in past yr: 0 0 0 - -  Injury with Fall? 0 0 0 - -  Follow up Falls prevention discussed - - - -   FALL RISK  PREVENTION PERTAINING TO THE HOME:  Any stairs in or around the home? Yes  If so, do they handrails? Yes   Home free of loose throw rugs in walkways, pet beds, electrical cords, etc? Yes  Adequate lighting in your home to reduce risk of falls? Yes   ASSISTIVE DEVICES UTILIZED TO PREVENT FALLS:  Life alert? No  Use of a cane, walker or w/c? No  Grab bars in the bathroom? Yes  Shower chair or bench in shower? Yes  Elevated toilet seat or a handicapped toilet? Yes   DME ORDERS:  DME order needed?  No   TIMED UP AND GO:  Was the test performed? No . Telephonic visit.   Education: Fall risk prevention has been discussed.  Intervention(s) required? No   Depression Screen PHQ 2/9 Scores 08/20/2019 06/11/2019 02/06/2019 06/28/2018  PHQ - 2 Score 0 0 0 0  PHQ- 9 Score - 0 0 0    Cognitive Function     6CIT Screen 08/20/2019 05/08/2017  What Year? 0 points 0 points  What month? 0 points 0 points  What time? 0 points 0 points  Count back from 20 0 points 0 points  Months in reverse 4 points 4 points  Repeat phrase 2 points 0 points  Total Score 6 4    Immunization History  Administered Date(s) Administered  . DTaP 08/23/2010  . Influenza, High Dose Seasonal PF 11/02/2017, 06/28/2018  . Influenza-Unspecified 10/07/2015  . Pneumococcal Conjugate-13 04/15/2014  . Pneumococcal Polysaccharide-23 08/23/2010    Qualifies for Shingles Vaccine? Yes . Due for Shingrix. Education has been provided regarding the importance of this vaccine. Pt has been advised to call insurance company to determine out of pocket expense. Advised may also receive vaccine at local pharmacy or Health Dept. Verbalized acceptance and understanding.  Tdap: Up to date  Flu Vaccine: Due for Flu vaccine. Does the patient want to receive this vaccine today?  No . Education has been provided regarding the importance of this vaccine but still declined. Advised may receive this vaccine at local pharmacy or Health  Dept. Aware to provide a copy of the vaccination record if obtained from local pharmacy or Health Dept. Verbalized acceptance and understanding.  Pneumococcal Vaccine: Up to date   Screening Tests Health Maintenance  Topic Date Due  . INFLUENZA VACCINE  05/25/2019  . TETANUS/TDAP  08/23/2020  . PNA vac Low Risk Adult  Completed   Cancer Screenings:  Colorectal Screening: Completed 2010.   Lung Cancer Screening: (Low Dose CT Chest recommended if Age 57-80 years, 30 pack-year currently smoking OR have quit w/in 15years.) does qualify. Completed 07/02/19   Additional Screening:  Hepatitis C Screening: no longer required  Vision Screening: Recommended annual ophthalmology exams for early detection of glaucoma and other disorders of the eye. Is the patient up to date with their annual eye exam?  No  - postponed due to Covid Who is the provider or what is the name of the office in which the pt attends annual eye exams? [facility]  Dental Screening: Recommended annual dental exams for proper oral hygiene  Community Resource Referral:  CRR required this visit?  No       Plan:    I have personally reviewed and addressed the Medicare Annual Wellness questionnaire and have noted the following in the patient's chart:  A. Medical and social history B. Use of alcohol, tobacco or illicit drugs  C. Current medications and supplements D. Functional ability and status E.  Nutritional status F.  Physical activity G. Advance directives H. List of other physicians I.  Hospitalizations, surgeries, and ER visits in previous 12 months J.  [place] such as hearing and vision if needed, cognitive and depression L. Referrals and appointments   In addition, I have reviewed and discussed with patient certain preventive protocols, quality metrics, and best practice recommendations. A written personalized care plan for preventive services as well as general preventive health  recommendations were provided to patient.   Signed,  Clemetine Marker, LPN Nurse Health Advisor   Nurse Notes: none

## 2019-08-20 NOTE — Patient Instructions (Signed)
Eric [name] , Thank you for taking time to come for your Medicare Wellness Visit. I appreciate your ongoing commitment to your health goals. Please review the following plan we discussed and let me know if I can assist you in the future.   Screening recommendations/referrals: Colonoscopy: done 2010  Recommended yearly ophthalmology/optometry visit for glaucoma screening and checkup Recommended yearly dental visit for hygiene and checkup  Vaccinations: Influenza vaccine: due Pneumococcal vaccine: done 04/15/14 Tdap vaccine: done 08/23/10 Shingles vaccine: Shingrix discussed. Please contact your pharmacy for coverage information.   Advanced directives: Advance directive discussed with you today. I have provided a copy for you to complete at home and have notarized. Once this is complete please bring a copy in to our office so we can scan it into your chart.  Conditions/risks identified: Recommend increasing physical activity  Next appointment: Please follow up in one year for your Medicare Annual Wellness visit.    Preventive Care 77 Years and Older, Male Preventive care refers to lifestyle choices and visits with your health care provider that can promote health and wellness. What does preventive care include?  A yearly physical exam. This is also called an annual well check.  Dental exams once or twice a year.  Routine eye exams. Ask your health care provider how often you should have your eyes checked.  Personal lifestyle choices, including:  Daily care of your teeth and gums.  Regular physical activity.  Eating a healthy diet.  Avoiding tobacco and drug use.  Limiting alcohol use.  Practicing safe sex.  Taking low doses of aspirin every day.  Taking vitamin and mineral supplements as recommended by your health care provider. What happens during an annual well check? The services and screenings done by your health care provider during your annual well check will  depend on your age, overall health, lifestyle risk factors, and family history of disease. Counseling  Your health care provider may ask you questions about your:  Alcohol use.  Tobacco use.  Drug use.  Emotional well-being.  Home and relationship well-being.  Sexual activity.  Eating habits.  History of falls.  Memory and ability to understand (cognition).  Work and work Statistician. Screening  You may have the following tests or measurements:  Height, weight, and BMI.  Blood pressure.  Lipid and cholesterol levels. These may be checked every 5 years, or more frequently if you are over 18 years old.  Skin check.  Lung cancer screening. You may have this screening every year starting at age 50 if you have a 30-pack-year history of smoking and currently smoke or have quit within the past 15 years.  Fecal occult blood test (FOBT) of the stool. You may have this test every year starting at age 72.  Flexible sigmoidoscopy or colonoscopy. You may have a sigmoidoscopy every 5 years or a colonoscopy every 10 years starting at age 68.  Prostate cancer screening. Recommendations will vary depending on your family history and other risks.  Hepatitis C blood test.  Hepatitis B blood test.  Sexually transmitted disease (STD) testing.  Diabetes screening. This is done by checking your blood sugar (glucose) after you have not eaten for a while (fasting). You may have this done every 1-3 years.  Abdominal aortic aneurysm (AAA) screening. You may need this if you are a current or former smoker.  Osteoporosis. You may be screened starting at age 16 if you are at high risk. Talk with your health care provider about your test results,  treatment options, and if necessary, the need for more tests. Vaccines  Your health care provider may recommend certain vaccines, such as:  Influenza vaccine. This is recommended every year.  Tetanus, diphtheria, and acellular pertussis (Tdap,  Td) vaccine. You may need a Td booster every 10 years.  Zoster vaccine. You may need this after age 94.  Pneumococcal 13-valent conjugate (PCV13) vaccine. One dose is recommended after age 63.  Pneumococcal polysaccharide (PPSV23) vaccine. One dose is recommended after age 9. Talk to your health care provider about which screenings and vaccines you need and how often you need them. This information is not intended to replace advice given to you by your health care provider. Make sure you discuss any questions you have with your health care provider. Document Released: 11/06/2015 Document Revised: 06/29/2016 Document Reviewed: 08/11/2015 Elsevier Interactive Patient Education  2017 [place] Prevention in the Home Falls can cause injuries. They can happen to people of all ages. There are many things you can do to make your home safe and to help prevent falls. What can I do on the outside of my home?  Regularly fix the edges of walkways and driveways and fix any cracks.  Remove anything that might make you trip as you walk through a door, such as a raised step or threshold.  Trim any bushes or trees on the path to your home.  Use bright outdoor lighting.  Clear any walking paths of anything that might make someone trip, such as rocks or tools.  Regularly check to see if handrails are loose or broken. Make sure that both sides of any steps have handrails.  Any raised decks and porches should have guardrails on the edges.  Have any leaves, snow, or ice cleared regularly.  Use sand or salt on walking paths during winter.  Clean up any spills in your garage right away. This includes oil or grease spills. What can I do in the bathroom?  Use night lights.  Install grab bars by the toilet and in the tub and shower. Do not use towel bars as grab bars.  Use non-skid mats or decals in the tub or shower.  If you need to sit down in the shower, use a plastic, non-slip stool.   Keep the floor dry. Clean up any water that spills on the floor as soon as it happens.  Remove soap buildup in the tub or shower regularly.  Attach bath mats securely with double-sided non-slip rug tape.  Do not have throw rugs and other things on the floor that can make you trip. What can I do in the bedroom?  Use night lights.  Make sure that you have a light by your bed that is easy to reach.  Do not use any sheets or blankets that are too big for your bed. They should not hang down onto the floor.  Have a firm chair that has side arms. You can use this for support while you get dressed.  Do not have throw rugs and other things on the floor that can make you trip. What can I do in the kitchen?  Clean up any spills right away.  Avoid walking on wet floors.  Keep items that you use a lot in easy-to-reach places.  If you need to reach something above you, use a strong step stool that has a grab bar.  Keep electrical cords out of the way.  Do not use floor polish or wax that makes floors  slippery. If you must use wax, use non-skid floor wax.  Do not have throw rugs and other things on the floor that can make you trip. What can I do with my stairs?  Do not leave any items on the stairs.  Make sure that there are handrails on both sides of the stairs and use them. Fix handrails that are broken or loose. Make sure that handrails are as long as the stairways.  Check any carpeting to make sure that it is firmly attached to the stairs. Fix any carpet that is loose or worn.  Avoid having throw rugs at the top or bottom of the stairs. If you do have throw rugs, attach them to the floor with carpet tape.  Make sure that you have a light switch at the top of the stairs and the bottom of the stairs. If you do not have them, ask someone to add them for you. What else can I do to help prevent falls?  Wear shoes that:  Do not have high heels.  Have rubber bottoms.  Are  comfortable and fit you well.  Are closed at the toe. Do not wear sandals.  If you use a stepladder:  Make sure that it is fully opened. Do not climb a closed stepladder.  Make sure that both sides of the stepladder are locked into place.  Ask someone to hold it for you, if possible.  Clearly mark and make sure that you can see:  Any grab bars or handrails.  First and last steps.  Where the edge of each step is.  Use tools that help you move around (mobility aids) if they are needed. These include:  Canes.  Walkers.  Scooters.  Crutches.  Turn on the lights when you go into a dark area. Replace any light bulbs as soon as they burn out.  Set up your furniture so you have a clear path. Avoid moving your furniture around.  If any of your floors are uneven, fix them.  If there are any pets around you, be aware of where they are.  Review your medicines with your doctor. Some medicines can make you feel dizzy. This can increase your chance of falling. Ask your doctor what other things that you can do to help prevent falls. This information is not intended to replace advice given to you by your health care provider. Make sure you discuss any questions you have with your health care provider. Document Released: 08/06/2009 Document Revised: 03/17/2016 Document Reviewed: 11/14/2014 Elsevier Interactive Patient Education  2017 [employer].

## 2019-12-02 ENCOUNTER — Ambulatory Visit: Payer: Medicare HMO | Attending: Internal Medicine

## 2019-12-02 ENCOUNTER — Other Ambulatory Visit: Payer: Self-pay

## 2019-12-02 DIAGNOSIS — Z23 Encounter for immunization: Secondary | ICD-10-CM | POA: Insufficient documentation

## 2019-12-02 NOTE — Progress Notes (Signed)
   Covid-19 Vaccination Clinic  Name:  MIRAC MCTIERNAN    MRN: [id number] DOB: 1940/11/12  12/02/2019  Mr. Dwinell was observed post Covid-19 immunization for 15 minutes   without incidence. He was provided with Vaccine Information Sheet and instruction to access the V-Safe system.   Mr. Newman was instructed to call 911 with any severe reactions post vaccine: Marland Kitchen Difficulty breathing  . Swelling of your face and throat  . A fast heartbeat  . A bad rash all over your body  . Dizziness and weakness    Immunizations Administered    Name Date Dose VIS Date Route   Moderna COVID-19 Vaccine 12/02/2019 12:41 PM 0.5 mL 09/24/2019 Intramuscular   Manufacturer: Moderna   Lot: [id number]   [id number]

## 2020-01-01 ENCOUNTER — Ambulatory Visit: Payer: Medicare HMO | Attending: Internal Medicine

## 2020-01-01 DIAGNOSIS — Z23 Encounter for immunization: Secondary | ICD-10-CM | POA: Insufficient documentation

## 2020-01-01 NOTE — Progress Notes (Signed)
   Covid-19 Vaccination Clinic  Name:  Eric [name]    MRN: [id number] DOB: 1940-12-15  01/01/2020  [name] was observed post Covid-19 immunization for 15 minutes without incident. He was provided with Vaccine Information Sheet and instruction to access the V-Safe system.   [name] was instructed to call 911 with any severe reactions post vaccine: Marland Kitchen Difficulty breathing  . Swelling of face and throat  . A fast heartbeat  . A bad rash all over body  . Dizziness and weakness   Immunizations Administered    Name Date Dose VIS Date Route   Moderna COVID-19 Vaccine 01/01/2020 12:12 PM 0.5 mL 09/24/2019 Intramuscular   Manufacturer: Moderna   Lot: [id number]   [id number]

## 2020-01-23 ENCOUNTER — Other Ambulatory Visit: Payer: Self-pay | Admitting: Family Medicine

## 2020-01-23 DIAGNOSIS — I10 Essential (primary) hypertension: Secondary | ICD-10-CM

## 2020-01-25 ENCOUNTER — Other Ambulatory Visit: Payer: Self-pay | Admitting: Family Medicine

## 2020-01-25 DIAGNOSIS — E7800 Pure hypercholesterolemia, unspecified: Secondary | ICD-10-CM

## 2020-04-16 ENCOUNTER — Other Ambulatory Visit: Payer: Self-pay | Admitting: Family Medicine

## 2020-04-16 DIAGNOSIS — I10 Essential (primary) hypertension: Secondary | ICD-10-CM

## 2020-04-16 NOTE — Telephone Encounter (Signed)
Requested  medications are  due for refill today yes  Requested medications are on the active medication list yes  Last refill 4/1  Last visit 10 months ago  Future visit scheduled Not until Nov  Notes to clinic Failed protocol due to no visit within 6 months

## 2020-04-20 NOTE — Telephone Encounter (Signed)
Not completely out yet.

## 2020-04-23 ENCOUNTER — Other Ambulatory Visit: Payer: Self-pay

## 2020-04-23 ENCOUNTER — Encounter: Payer: Self-pay | Admitting: Family Medicine

## 2020-04-23 ENCOUNTER — Ambulatory Visit (INDEPENDENT_AMBULATORY_CARE_PROVIDER_SITE_OTHER): Payer: Medicare HMO | Admitting: Family Medicine

## 2020-04-23 VITALS — BP 112/70 | HR 92 | Temp 97.9°F | Resp 18 | Ht 66.0 in | Wt 146.3 lb

## 2020-04-23 DIAGNOSIS — R739 Hyperglycemia, unspecified: Secondary | ICD-10-CM | POA: Diagnosis not present

## 2020-04-23 DIAGNOSIS — J432 Centrilobular emphysema: Secondary | ICD-10-CM | POA: Diagnosis not present

## 2020-04-23 DIAGNOSIS — E7800 Pure hypercholesterolemia, unspecified: Secondary | ICD-10-CM | POA: Diagnosis not present

## 2020-04-23 DIAGNOSIS — I700 Atherosclerosis of aorta: Secondary | ICD-10-CM | POA: Diagnosis not present

## 2020-04-23 DIAGNOSIS — I10 Essential (primary) hypertension: Secondary | ICD-10-CM | POA: Diagnosis not present

## 2020-04-23 DIAGNOSIS — R42 Dizziness and giddiness: Secondary | ICD-10-CM

## 2020-04-23 DIAGNOSIS — I2510 Atherosclerotic heart disease of native coronary artery without angina pectoris: Secondary | ICD-10-CM

## 2020-04-23 DIAGNOSIS — N1831 Chronic kidney disease, stage 3a: Secondary | ICD-10-CM

## 2020-04-23 LAB — COMPLETE METABOLIC PANEL WITH GFR
AG Ratio: 1.4 (calc) (ref 1.0–2.5)
ALT: 9 U/L (ref 9–46)
AST: 12 U/L (ref 10–35)
Albumin: 4.2 g/dL (ref 3.6–5.1)
Alkaline phosphatase (APISO): 78 U/L (ref 35–144)
BUN/Creatinine Ratio: 17 (calc) (ref 6–22)
BUN: 29 mg/dL — ABNORMAL HIGH (ref 7–25)
CO2: 28 mmol/L (ref 20–32)
Calcium: 9.9 mg/dL (ref 8.6–10.3)
Chloride: 100 mmol/L (ref 98–110)
Creat: 1.66 mg/dL — ABNORMAL HIGH (ref 0.70–1.18)
GFR, Est African American: 45 mL/min/{1.73_m2} — ABNORMAL LOW (ref >=60)
GFR, Est Non African American: 39 mL/min/{1.73_m2} — ABNORMAL LOW (ref >=60)
Globulin: 3 g/dL (calc) (ref 1.9–3.7)
Glucose, Bld: 94 mg/dL (ref 65–99)
Potassium: 4.4 mmol/L (ref 3.5–5.3)
Sodium: 136 mmol/L (ref 135–146)
Total Bilirubin: 0.4 mg/dL (ref 0.2–1.2)
Total Protein: 7.2 g/dL (ref 6.1–8.1)

## 2020-04-23 LAB — POCT GLYCOSYLATED HEMOGLOBIN (HGB A1C): Hemoglobin A1C: 6.3 % — AB (ref 4.0–5.6)

## 2020-04-23 LAB — CBC WITH DIFFERENTIAL/PLATELET
Absolute Monocytes: 592 cells/uL (ref 200–950)
Basophils Absolute: 39 cells/uL (ref 0–200)
Basophils Relative: 0.4 %
Eosinophils Absolute: 58 cells/uL (ref 15–500)
Eosinophils Relative: 0.6 %
HCT: 40.1 % (ref 38.5–50.0)
Hemoglobin: 13.5 g/dL (ref 13.2–17.1)
Lymphs Abs: 1853 cells/uL (ref 850–3900)
MCH: 32.1 pg (ref 27.0–33.0)
MCHC: 33.7 g/dL (ref 32.0–36.0)
MCV: 95.5 fL (ref 80.0–100.0)
MPV: 9.6 fL (ref 7.5–12.5)
Monocytes Relative: 6.1 %
Neutro Abs: 7159 cells/uL (ref 1500–7800)
Neutrophils Relative %: 73.8 %
Platelets: 358 10*3/uL (ref 140–400)
RBC: 4.2 10*6/uL (ref 4.20–5.80)
RDW: 12.7 % (ref 11.0–15.0)
Total Lymphocyte: 19.1 %
WBC: 9.7 10*3/uL (ref 3.8–10.8)

## 2020-04-23 LAB — LIPID PANEL
Cholesterol: 245 mg/dL — ABNORMAL HIGH (ref <200)
HDL: 34 mg/dL — ABNORMAL LOW (ref >=40)
LDL Cholesterol (Calc): 172 mg/dL (calc) — ABNORMAL HIGH
Non-HDL Cholesterol (Calc): 211 mg/dL (calc) — ABNORMAL HIGH (ref <130)
Total CHOL/HDL Ratio: 7.2 (calc) — ABNORMAL HIGH (ref <5.0)
Triglycerides: 230 mg/dL — ABNORMAL HIGH (ref <150)

## 2020-04-23 MED ORDER — LISINOPRIL 5 MG PO TABS: 5.0000 mg | ORAL_TABLET | Freq: Every day | ORAL | 1 refills | Status: DC

## 2020-04-23 MED ORDER — MECLIZINE HCL 25 MG PO TABS: 25.0000 mg | ORAL_TABLET | Freq: Every day | ORAL | 0 refills | Status: DC | PRN

## 2020-04-23 MED ORDER — EZETIMIBE 10 MG PO TABS: 10.0000 mg | ORAL_TABLET | Freq: Every day | ORAL | 1 refills | Status: DC

## 2020-04-23 MED ORDER — METOPROLOL SUCCINATE ER 25 MG PO TB24: 25.0000 mg | ORAL_TABLET | Freq: Every day | ORAL | 1 refills | Status: DC

## 2020-04-23 MED ORDER — IPRATROPIUM-ALBUTEROL 20-100 MCG/ACT IN AERS: 1.0000 | INHALATION_SPRAY | Freq: Four times a day (QID) | RESPIRATORY_TRACT | 1 refills | Status: DC

## 2020-04-23 MED ORDER — ROSUVASTATIN CALCIUM 40 MG PO TABS: 40.0000 mg | ORAL_TABLET | Freq: Every day | ORAL | 1 refills | Status: DC

## 2020-04-23 NOTE — Progress Notes (Signed)
Name: Eric [name]   MRN: [id number]    DOB: 04-Jan-1941   Date:04/23/2020       Progress Note  Subjective  Chief Complaint  Chief Complaint  Patient presents with  . Medication Refill  . Hyperglycemia    HPI   Emphysema: only has sob with moderate activity, he is smoking half pack daily, he has been smoking for the past 60 plus years. He has a smokers cough. Cough is usually dry. 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 He is not on inhaler, he states he is willing to try it. Reviewed CT chest   CKI stage III: discussed avoiding nsaid's, we will monitor for now. No symptoms. Last GFR was 57. He denies pruritis, normal urine output   CAD 3 vessels and aorta atherosclerosis. On CT chest done 06/2018 and repeat in 07/02/2019 He denies chest pain or decrease in exercise tolerance. He is supposed to be on Crestor and Zetia, but not seen since last Summer and not sure if he has medications at home. He has been taking 81 mg aspirin since last visit, denies bleeding   Hyperglycemia, hgbA1C went up from 5.0% to 6.1%  and today it was 6.3 % , he denies polyphagia, polyuria or polydipsia. Discussed low carbohydrate   HTN:we called pharmacy, he is currently only taking zetia and metoprolol , he states very seldom gets lightheaded when he stands up, he is not sure why he is on metoprolol, but his heart rate is a little elevated today, explained he has CKI and needs to be on ARB or ACE, we will start at very low dose, check labs today and monitor for increase in orthostatic changes.   Vertigo: he has a long history of vertigo, spinning sensation, but no episodes in about one year but he would like to have a refill of Meclizine   Patient Active Problem List   Diagnosis Date Noted  . Coronary artery calcification seen on CAT scan 06/28/2018  . Centrilobular emphysema ([place]) 06/28/2018  . Chronic kidney disease, stage III (moderate) 06/28/2018   . Atherosclerosis of aorta ([place]) 06/19/2018  . Chronic low back pain with sciatica 02/07/2018  . Hyperglycemia 05/03/2017  . Simple chronic bronchitis ([place]) 05/07/2015  . Essential hypertension 05/07/2015  . Hyperlipidemia 05/07/2015  . Tobacco abuse 05/07/2015    History reviewed. No pertinent surgical history.  Family History  Problem Relation Age of Onset  . Heart disease Mother   . COPD Father     Social History   Tobacco Use  . Smoking status: Current Every Day Smoker    Packs/day: 0.50    Years: 60.00    Pack years: 30.00    Types: Cigarettes  . Smokeless tobacco: Never Used  Substance Use Topics  . Alcohol use: Yes    Alcohol/week: 0.0 standard drinks    Comment: occasional beer (1)     Current Outpatient Medications:  .  acetaminophen (TYLENOL) 500 MG tablet, Take 1 tablet (500 mg total) by mouth every 6 (six) hours as needed., Disp: 30 tablet, Rfl: 0 .  aspirin 81 MG chewable tablet, Chew 81 mg by mouth daily., Disp: , Rfl:  .  ezetimibe (ZETIA) 10 MG tablet, TAKE 1 TABLET BY MOUTH EVERY DAY, Disp: 90 tablet, Rfl: 1 .  meclizine (ANTIVERT) 25 MG tablet, Take 1 tablet (25 mg total) by mouth daily as needed for dizziness., Disp: 30 tablet, Rfl: 2 .  metoprolol succinate (TOPROL-XL) 25 MG 24 hr tablet, TAKE 1 TABLET (25 MG TOTAL) BY MOUTH DAILY. TAKE WITH OR IMMEDIATELY FOLLOWING A MEAL., Disp: 90 tablet, Rfl: 0 .  rosuvastatin (CRESTOR) 40 MG tablet, Take 1 tablet (40 mg total) by mouth daily., Disp: 90 tablet, Rfl: 1 .  triamterene-hydrochlorothiazide (MAXZIDE-25) 37.5-25 MG tablet, TAKE 1 TABLET BY MOUTH EVERY DAY, Disp: 90 tablet, Rfl: 0  No Known Allergies  I personally reviewed active problem list, medication list, allergies, family history, social history with the patient/caregiver today.   ROS  Constitutional: Negative for fever or weight change.  Respiratory: Negative for cough and shortness of breath.   Cardiovascular: Negative for chest pain or  palpitations.  Gastrointestinal: Negative for abdominal pain, no bowel changes.  Musculoskeletal: Negative for gait problem or joint swelling.  Skin: Negative for rash.  Neurological: Negative for dizziness or headache.  No other specific complaints in a complete review of systems (except as listed in HPI above).  Objective  Vitals:   04/23/20 1106  BP: 112/70  Pulse: 92  Resp: 18  Temp: 97.9 F (36.6 C)  TempSrc: Temporal  SpO2: 96%  Weight: 146 lb 4.8 oz (66.4 kg)  Height: 5\' 6"  (1.676 m)    Body mass index is 23.61 kg/m.  Physical Exam  Constitutional: Patient appears well-developed and well-nourished.  No distress.  HEENT: head atraumatic, normocephalic, pupils equal and reactive to light, neck supple Cardiovascular: Normal rate, regular rhythm and normal heart sounds.  No murmur heard. No BLE edema. Pulmonary/Chest: Effort normal and breath sounds normal. No respiratory distress. Abdominal: Soft.  There is no tenderness. Psychiatric: Patient has a normal mood and affect. behavior is normal. Judgment and thought content normal.  Recent Results (from the past 2160 hour(s))  POCT HgB A1C     Status: Abnormal   Collection Time: 04/23/20 11:21 AM  Result Value Ref Range   Hemoglobin A1C 6.3 (A) 4.0 - 5.6 %   HbA1c POC (<> result, manual entry)     HbA1c, POC (prediabetic range)     HbA1c, POC (controlled diabetic range)       PHQ2/9: Depression screen [facility] 2/9 04/23/2020 08/20/2019 06/11/2019 02/06/2019 06/28/2018  Decreased Interest 0 0 0 0 0  Down, Depressed, Hopeless 0 0 0 0 0  PHQ - 2 Score 0 0 0 0 0  Altered sleeping 0 - 0 0 0  Tired, decreased energy 0 - 0 0 0  Change in appetite 0 - 0 0 0  Feeling bad or failure about yourself  0 - 0 0 0  Trouble concentrating 0 - 0 0 0  Moving slowly or fidgety/restless 0 - 0 0 0  Suicidal thoughts 0 - 0 0 -  PHQ-9 Score 0 - 0 0 0  Difficult doing work/chores - - - - -    phq 9 is negative   Fall Risk: Fall Risk   04/23/2020 08/20/2019 06/11/2019 02/06/2019 06/28/2018  Falls in the past year? 0 0 0 0 No  Number falls in past yr: 0 0 0 0 -  Injury with Fall? 0 0 0 0 -  Follow up - Falls prevention discussed - - -    Functional Status Survey: Is the patient deaf or have difficulty hearing?: No Does the patient have difficulty seeing, even when wearing glasses/contacts?: No Does the patient have difficulty concentrating, remembering, or making decisions?: No Does the patient have difficulty walking or climbing stairs?: No Does the patient have difficulty dressing or  bathing?: No Does the patient have difficulty doing errands alone such as visiting a doctor's office or shopping?: No   Assessment & Plan   1. Hyperglycemia  - POCT HgB A1C  2. Atherosclerosis of aorta (H[facility])  - Lipid panel  3. Pure hypercholesterolemia  - Lipid panel - rosuvastatin (CRESTOR) 40 MG tablet; Take 1 tablet (40 mg total) by mouth daily.  Dispense: 90 tablet; Refill: 1 - ezetimibe (ZETIA) 10 MG tablet; Take 1 tablet (10 mg total) by mouth daily.  Dispense: 90 tablet; Refill: 1  4. Coronary artery calcification seen on CAT scan  Not compliant with crestor, on zetia only at this time  5. Essential hypertension  BP towards low end of normal , heart rate in the 90's, continue metoprolol, we are adding low dose of lisinopril for kidney protection and monitor  - CBC with Differential/Platelet - COMPLETE METABOLIC PANEL WITH GFR - metoprolol succinate (TOPROL-XL) 25 MG 24 hr tablet; Take 1 tablet (25 mg total) by mouth daily. Take with or immediately following a meal.  Dispense: 90 tablet; Refill: 1 - lisinopril (ZESTRIL) 5 MG tablet; Take 1 tablet (5 mg total) by mouth daily. For bp and kidney protection  Dispense: 90 tablet; Refill: 1  6. Centrilobular emphysema ([place])  - Ipratropium-Albuterol (COMBIVENT) 20-100 MCG/ACT AERS respimat; Inhale 1 puff into the lungs every 6 (six) hours.  Dispense: 4 g; Refill: 1  7. Stage  3a chronic kidney disease  - COMPLETE METABOLIC PANEL WITH GFR - lisinopril (ZESTRIL) 5 MG tablet; Take 1 tablet (5 mg total) by mouth daily. For bp and kidney protection  Dispense: 90 tablet; Refill: 1  8. Vertigo  - meclizine (ANTIVERT) 25 MG tablet; Take 1 tablet (25 mg total) by mouth daily as needed for dizziness.  Dispense: 30 tablet; Refill: 0

## 2020-06-03 ENCOUNTER — Telehealth: Payer: Self-pay | Admitting: *Deleted

## 2020-06-03 NOTE — Telephone Encounter (Signed)
(  06/03/2020) Pt notified that lung cancer screening imaging is due currently or in the near future. Verified smoking history (Current Smoker, 0.5 ppd ). Tentative appt on 07/15/20 @ 11:15 am  SRW

## 2020-06-05 ENCOUNTER — Other Ambulatory Visit: Payer: Self-pay | Admitting: *Deleted

## 2020-06-05 DIAGNOSIS — Z87891 Personal history of nicotine dependence: Secondary | ICD-10-CM

## 2020-06-05 DIAGNOSIS — Z122 Encounter for screening for malignant neoplasm of respiratory organs: Secondary | ICD-10-CM

## 2020-07-10 ENCOUNTER — Telehealth: Payer: Self-pay | Admitting: *Deleted

## 2020-07-10 NOTE — Chronic Care Management (AMB) (Signed)
  Chronic Care Management   Note  07/10/2020 Name: CONNELL BOGNAR MRN: [id number] DOB: 1941/09/18  MARKEL KURTENBACH is a 79 y.o. year old male who is a primary care patient of Steele Sizer, MD. I reached out to [name] by phone today in response to a referral sent by Mr. Donyae Kilner Geoffroy's health plan.     Mr. Mendell was given information about Chronic Care Management services today including:  1. CCM service includes personalized support from designated clinical staff supervised by his physician, including individualized plan of care and coordination with other care providers 2. 24/7 contact phone numbers for assistance for urgent and routine care needs. 3. Service will only be billed when office clinical staff spend 20 minutes or more in a month to coordinate care. 4. Only one practitioner may furnish and bill the service in a calendar month. 5. The patient may stop CCM services at any time (effective at the end of the month) by phone call to the office staff. 6. The patient will be responsible for cost sharing (co-pay) of up to 20% of the service fee (after annual deductible is met).  Patient agreed to services and verbal consent obtained.   Follow up plan: Telephone appointment with care management team member scheduled for: 07/21/2020  [facility]

## 2020-07-15 ENCOUNTER — Ambulatory Visit
Admission: RE | Admit: 2020-07-15 | Discharge: 2020-07-15 | Disposition: A | Discharge status: Home / Self Care | Payer: Medicare HMO | Source: Ambulatory Visit | Attending: Nurse Practitioner | Admitting: Nurse Practitioner

## 2020-07-15 ENCOUNTER — Other Ambulatory Visit: Payer: Self-pay

## 2020-07-15 DIAGNOSIS — I700 Atherosclerosis of aorta: Secondary | ICD-10-CM | POA: Insufficient documentation

## 2020-07-15 DIAGNOSIS — Z87891 Personal history of nicotine dependence: Secondary | ICD-10-CM | POA: Diagnosis not present

## 2020-07-15 DIAGNOSIS — J439 Emphysema, unspecified: Secondary | ICD-10-CM | POA: Diagnosis not present

## 2020-07-15 DIAGNOSIS — Z122 Encounter for screening for malignant neoplasm of respiratory organs: Secondary | ICD-10-CM | POA: Diagnosis not present

## 2020-07-19 ENCOUNTER — Encounter: Payer: Self-pay | Admitting: *Deleted

## 2020-07-21 ENCOUNTER — Ambulatory Visit: Payer: Medicare HMO | Admitting: Pharmacist

## 2020-07-21 ENCOUNTER — Other Ambulatory Visit: Payer: Self-pay

## 2020-07-21 DIAGNOSIS — I10 Essential (primary) hypertension: Secondary | ICD-10-CM

## 2020-07-21 DIAGNOSIS — E7800 Pure hypercholesterolemia, unspecified: Secondary | ICD-10-CM

## 2020-07-21 NOTE — Chronic Care Management (AMB) (Signed)
Chronic Care Management Pharmacy  Name: Eric [name]  MRN: [id number] DOB: 03-10-1941  Chief Complaint/ HPI  Eric [name],  79 y.o. , male presents for their Initial CCM visit with the clinical pharmacist via telephone due to COVID-19 Pandemic.  PCP : Eric Sizer, MD  Their chronic conditions include: HTN, HLD, COPD  Office Visits: 7/1 aorta atherosclerosis, [place], BP 112/70 P 92 Wt 146.5 BMI 23.6, current smoker, no inhaler, A1c inc 6.1%, start low dose ARB/ACEI, statin nonadherrent  Consult Visit: NA  Medications: Outpatient Encounter Medications as of 07/21/2020  Medication Sig  . acetaminophen (TYLENOL) 500 MG tablet Take 1 tablet (500 mg total) by mouth every 6 (six) hours as needed.  Marland Kitchen aspirin 81 MG chewable tablet Chew 81 mg by mouth daily.  Marland Kitchen ezetimibe (ZETIA) 10 MG tablet Take 1 tablet (10 mg total) by mouth daily.  . Ipratropium-Albuterol (COMBIVENT) 20-100 MCG/ACT AERS respimat Inhale 1 puff into the lungs every 6 (six) hours.  Marland Kitchen lisinopril (ZESTRIL) 5 MG tablet Take 1 tablet (5 mg total) by mouth daily. For bp and kidney protection  . meclizine (ANTIVERT) 25 MG tablet Take 1 tablet (25 mg total) by mouth daily as needed for dizziness.  . metoprolol succinate (TOPROL-XL) 25 MG 24 hr tablet Take 1 tablet (25 mg total) by mouth daily. Take with or immediately following a meal.  . rosuvastatin (CRESTOR) 40 MG tablet Take 1 tablet (40 mg total) by mouth daily.   No facility-administered encounter medications on file as of 07/21/2020.     Financial Resource Strain:   . Difficulty of Paying Living Expenses: Not on file    Current Diagnosis/Assessment:  Goals Addressed   None     Hypertension   BP goal is:  <140/90  Office blood pressures are  BP Readings from Last 3 Encounters:  04/23/20 112/70  06/28/18 (!) 148/64  05/09/18 138/82   Patient checks BP at home infrequently Patient home BP readings are ranging: NA  Patient has failed these  meds in the past: NA Patient is currently controlled on the following medications:  . Lisinopril 85m daily . Metoprolol succinate 244mdaily  We discussed: At goal Denies hypotension Aware lisinopril is a kidney protection dose  Plan  Continue current medications   Hyperlipidemia   LDL goal < 100  Lipid Panel     Component Value Date/Time   CHOL 245 (H) 04/23/2020 1208   CHOL 150 08/17/2015 0928   TRIG 230 (H) 04/23/2020 1208   HDL 34 (L) 04/23/2020 1208   HDL 35 (L) 08/17/2015 0928   LDLCALC 172 (H) 04/23/2020 1208    Hepatic Function Latest Ref Rng & Units 04/23/2020 05/09/2018 11/02/2017  Total Protein 6.1 - 8.1 g/dL 7.2 7.1 6.8  Albumin 3.6 - 5.1 g/dL - - -  AST 10 - 35 U/L '12 13 10  ' ALT 9 - 46 U/L 9 11 7(L)  Alk Phosphatase 40 - 115 U/L - - -  Total Bilirubin 0.2 - 1.2 mg/dL 0.4 0.4 0.4     The 10-year ASCVD risk score (GMikey BussingC Jr., et al., 2013) is: 35.7%   Values used to calculate the score:     Age: [age]     Sex: Male     Is Non-Hispanic African American: Yes     Diabetic: No     Tobacco smoker: Yes     Systolic Blood Pressure: 11003mHg     Is BP treated: Yes  HDL Cholesterol: 34 mg/dL     Total Cholesterol: 245 mg/dL   Patient has failed these meds in past: NA Patient is currently uncontrolled on the following medications:  . Crestor 56m daily (restarted after PCP visit)  We discussed:   Not at goal Lab values don't reflect Crestor restart  Maybe referral to nutrition, f/u not diabetic  Plan  Cut out DAILY ice cream Continue current medications  COPD    Eosinophil count:   Lab Results  Component Value Date/Time   EOSPCT 0.6 04/23/2020 12:08 PM  %                               Eos (Absolute):  Lab Results  Component Value Date/Time   EOSABS 58 04/23/2020 12:08 PM    Tobacco Status:  Social History   Tobacco Use  Smoking Status Current Every Day Smoker  . Packs/day: 0.50  . Years: 60.00  . Pack years: 30.00  . Types:  Cigarettes  Smokeless Tobacco Never Used   Patient has failed these meds in past: Combivent (cost) Patient is currently controlled on the following medications: None Using maintenance inhaler regularly? No Frequency of rescue inhaler use:  never  We discussed:  smoking cessation Rarely uses Combivent due to cost, but also feels like he doesn't need it as much lately  Plan  Continue current medications  Medication Management   [facility] Uses pill box? Yes Pt endorses 90% compliance  We discussed:  [facility] Not interested in [facility]. Not interested in mail order.  Lost to follow up for a year or so. Tylenol not every day Can't afford Combivent, denies breathing problems Antivert months ago  Plan  Continue current medication management strategy  Follow up: 4 month phone visit  TMilus Height PharmD, BRomilda Garret CMassanutten Medical [phone]

## 2020-07-23 NOTE — Patient Instructions (Addendum)
Visit Information  Goals Addressed            This Visit's Progress   . Chronic Care Management       CARE PLAN ENTRY (see longitudinal plan of care for additional care plan information)  Current Barriers:  . Chronic Disease Management support, education, and care coordination needs related to Hypertension, Hyperlipidemia, and COPD   Hypertension BP Readings from Last 3 Encounters:  04/23/20 112/70  06/28/18 (!) 148/64  05/09/18 138/82   . Pharmacist Clinical Goal(s): o Over the next 90 days, patient will work with PharmD and providers to maintain BP goal <140/90 . Current regimen:  . Lisinopril 5mg  daily . Metoprolol succinate 25mg  daily  . Interventions: o None . Patient self care activities - Over the next 90 days, patient will: o Check BP weekly, document, and provide at future appointments o Ensure daily salt intake < 2300 mg/day  Hyperlipidemia Lab Results  Component Value Date/Time   LDLCALC 172 (H) 04/23/2020 12:08 PM   . Pharmacist Clinical Goal(s): o Over the next 90 days, patient will work with PharmD and providers to achieve LDL goal < 100 . Current regimen:  o Crestor 40mg  daily (not reflected in lab values) . Interventions: o Please stop eating ice cream every day . Patient self care activities - Over the next 90 days, patient will: o Take rosuvastatin 40mg  every day  COPD . Pharmacist Clinical Goal(s) o Over the next 90 days, patient will work with PharmD and providers to improve breathing . Current regimen:  o Combivent rarely due to cost . Interventions: o Planning for patient assistance programs . Patient self care activities - Over the next 90 days, patient will: o Please provide proof of income for patient assistance applications  Medication management . Pharmacist Clinical Goal(s): o Over the next 90 days, patient will work with PharmD and providers to achieve optimal medication adherence . [facility] . Interventions o Comprehensive medication review performed. o Continue current medication management strategy . Patient self care activities - Over the next 90 days, patient will: o Focus on medication adherence by working with Doristine Counter, CMA on patient assistance programs o Take medications as prescribed o Report any questions or concerns to PharmD and/or provider(s)  Initial goal documentation        Eric [name] was given information about Chronic Care Management services today including:  1. CCM service includes personalized support from designated clinical staff supervised by his physician, including individualized plan of care and coordination with other care providers 2. 24/7 contact phone numbers for assistance for urgent and routine care needs. 3. Standard insurance, coinsurance, copays and deductibles apply for chronic care management only during months in which we provide at least 20 minutes of these services. Most insurances cover these services at 100%, however patients may be responsible for any copay, coinsurance and/or deductible if applicable. This service may help you avoid the need for more expensive face-to-face services. 4. Only one practitioner may furnish and bill the service in a calendar month. 5. The patient may stop CCM services at any time (effective at the end of the month) by phone call to the office staff.  Patient agreed to services and verbal consent obtained.   The patient verbalized understanding of instructions provided today and agreed to receive a mailed copy of patient instruction and/or educational materials. Telephone follow up appointment with pharmacy team member scheduled for:  Milus Height, PharmD, BCGP, [facility]  [phone]   Steps to Quit Smoking Smoking tobacco is the leading cause of preventable death. It can affect almost every organ in the body. Smoking puts you and people around you  at risk for many serious, long-lasting (chronic) diseases. Quitting smoking can be hard, but it is one of the best things that you can do for your health. It is never too late to quit. How do I get ready to quit? When you decide to quit smoking, make a plan to help you succeed. Before you quit:  Pick a date to quit. Set a date within the next 2 weeks to give you time to prepare.  Write down the reasons why you are quitting. Keep this list in places where you will see it often.  Tell your family, friends, and co-workers that you are quitting. Their support is important.  Talk with your doctor about the choices that may help you quit.  Find out if your health insurance will pay for these treatments.  Know the people, places, things, and activities that make you want to smoke (triggers). Avoid them. What first steps can I take to quit smoking?  Throw away all cigarettes at home, at work, and in your car.  Throw away the things that you use when you smoke, such as ashtrays and lighters.  Clean your car. Make sure to empty the ashtray.  Clean your home, including curtains and carpets. What can I do to help me quit smoking? Talk with your doctor about taking medicines and seeing a counselor at the same time. You are more likely to succeed when you do both.  If you are pregnant or breastfeeding, talk with your doctor about counseling or other ways to quit smoking. Do not take medicine to help you quit smoking unless your doctor tells you to do so. To quit smoking: Quit right away  Quit smoking totally, instead of slowly cutting back on how much you smoke over a period of time.  Go to counseling. You are more likely to quit if you go to counseling sessions regularly. Take medicine You may take medicines to help you quit. Some medicines need a prescription, and some you can buy over-the-counter. Some medicines may contain a drug called nicotine to replace the nicotine in cigarettes.  Medicines may:  Help you to stop having the desire to smoke (cravings).  Help to stop the problems that come when you stop smoking (withdrawal symptoms). Your doctor may ask you to use:  Nicotine patches, gum, or lozenges.  Nicotine inhalers or sprays.  Non-nicotine medicine that is taken by mouth. Find resources Find resources and other ways to help you quit smoking and remain smoke-free after you quit. These resources are most helpful when you use them often. They include:  Online chats with a Social worker.  Phone quitlines.  Printed Furniture conservator/restorer.  Support groups or group counseling.  Text messaging programs.  Mobile phone apps. Use apps on your mobile phone or tablet that can help you stick to your quit plan. There are many free apps for mobile phones and tablets as well as websites. Examples include Quit Guide from the [employer] and smokefree.gov  What things can I do to make it easier to quit?   Talk to your family and friends. Ask them to support and encourage you.  Call a phone quitline (1-800-QUIT-NOW), reach out to support groups, or work with a Social worker.  Ask people who smoke to not smoke around you.  Avoid places that make  you want to smoke, such as: ? Bars. ? Parties. ? Smoke-break areas at work.  Spend time with people who do not smoke.  Lower the stress in your life. Stress can make you want to smoke. Try these things to help your stress: ? Getting regular exercise. ? Doing deep-breathing exercises. ? Doing yoga. ? Meditating. ? Doing a body scan. To do this, close your eyes, focus on one area of your body at a time from head to toe. Notice which parts of your body are tense. Try to relax the muscles in those areas. How will I feel when I quit smoking? Day 1 to 3 weeks Within the first 24 hours, you may start to have some problems that come from quitting tobacco. These problems are very bad 2-3 days after you quit, but they do not often last for more  than 2-3 weeks. You may get these symptoms:  Mood swings.  Feeling restless, nervous, angry, or annoyed.  Trouble concentrating.  Dizziness.  Strong desire for high-sugar foods and nicotine.  Weight gain.  Trouble pooping (constipation).  Feeling like you may vomit (nausea).  Coughing or a sore throat.  Changes in how the medicines that you take for other issues work in your body.  Depression.  Trouble sleeping (insomnia). Week 3 and afterward After the first 2-3 weeks of quitting, you may start to notice more positive results, such as:  Better sense of smell and taste.  Less coughing and sore throat.  Slower heart rate.  Lower blood pressure.  Clearer skin.  Better breathing.  Fewer sick days. Quitting smoking can be hard. Do not give up if you fail the first time. Some people need to try a few times before they succeed. Do your best to stick to your quit plan, and talk with your doctor if you have any questions or concerns. Summary  Smoking tobacco is the leading cause of preventable death. Quitting smoking can be hard, but it is one of the best things that you can do for your health.  When you decide to quit smoking, make a plan to help you succeed.  Quit smoking right away, not slowly over a period of time.  When you start quitting, seek help from your doctor, family, or friends. This information is not intended to replace advice given to you by your health care provider. Make sure you discuss any questions you have with your health care provider. Document Revised: 07/05/2019 Document Reviewed: 12/29/2018 Elsevier Patient Education  [place].

## 2020-08-25 ENCOUNTER — Ambulatory Visit: Payer: Medicare HMO

## 2020-10-15 ENCOUNTER — Other Ambulatory Visit: Payer: Self-pay | Admitting: Family Medicine

## 2020-10-15 DIAGNOSIS — I10 Essential (primary) hypertension: Secondary | ICD-10-CM

## 2020-10-15 DIAGNOSIS — N1831 Chronic kidney disease, stage 3a: Secondary | ICD-10-CM

## 2020-10-15 DIAGNOSIS — E7800 Pure hypercholesterolemia, unspecified: Secondary | ICD-10-CM

## 2020-10-19 NOTE — Progress Notes (Deleted)
Name: Eric [name]   MRN: [id number]    DOB: [birth year]   Date:10/19/2020       Progress Note  Subjective  Chief Complaint  Follow up  HPI  Emphysema: only has sob with moderate activity, he is smoking half pack daily, he has been smoking for the past 60 plus years. He has a smokers cough. Cough is usually dry. 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 He is not on inhaler, he states he is willing to try it. Reviewed CT chest   CKI stage III: discussed avoiding nsaid's, we will monitor for now. No symptoms. Last GFR was 57. He denies pruritis, normal urine output   CAD 3 vessels and aorta atherosclerosis. On CT chest done 06/2018 and repeat in 07/02/2019 He denies chest pain or decrease in exercise tolerance. He is supposed to be on Crestor and Zetia, but not seen since last Summer and not sure if he has medications at home. He has been taking 81 mg aspirin since last visit, denies bleeding   Hyperglycemia, hgbA1C went up from 5.0% to 6.1%  and today it was 6.3 % , he denies polyphagia, polyuria or polydipsia. Discussed low carbohydrate   HTN:we called pharmacy, he is currently only taking zetia and metoprolol , he states very seldom gets lightheaded when he stands up, he is not sure why he is on metoprolol, but his heart rate is a little elevated today, explained he has CKI and needs to be on ARB or ACE, we will start at very low dose, check labs today and monitor for increase in orthostatic changes.   Vertigo: he has a long history of vertigo, spinning sensation, but no episodes in about one year but he would like to have a refill of Meclizine   Patient Active Problem List   Diagnosis Date Noted  . Coronary artery calcification seen on CAT scan 06/28/2018  . Centrilobular emphysema ([place]) 06/28/2018  . Chronic kidney disease, stage III (moderate) ([place]) 06/28/2018  . Atherosclerosis of aorta ([place]) 06/19/2018  . Chronic low back  pain with sciatica 02/07/2018  . Hyperglycemia 05/03/2017  . Simple chronic bronchitis ([place]) 05/07/2015  . Essential hypertension 05/07/2015  . Hyperlipidemia 05/07/2015  . Tobacco abuse 05/07/2015    No past surgical history on file.  Family History  Problem Relation Age of Onset  . Heart disease Mother   . COPD Father     Social History   Tobacco Use  . Smoking status: Current Every Day Smoker    Packs/day: 0.50    Years: 60.00    Pack years: 30.00    Types: Cigarettes  . Smokeless tobacco: Never Used  Substance Use Topics  . Alcohol use: Yes    Alcohol/week: 0.0 standard drinks    Comment: occasional beer (1)     Current Outpatient Medications:  .  acetaminophen (TYLENOL) 500 MG tablet, Take 1 tablet (500 mg total) by mouth every 6 (six) hours as needed., Disp: 30 tablet, Rfl: 0 .  aspirin 81 MG chewable tablet, Chew 81 mg by mouth daily., Disp: , Rfl:  .  ezetimibe (ZETIA) 10 MG tablet, Take 1 tablet (10 mg total) by mouth daily., Disp: 90 tablet, Rfl: 1 .  Ipratropium-Albuterol (COMBIVENT) 20-100 MCG/ACT AERS respimat, Inhale 1 puff into the lungs every 6 (six) hours. (Patient not taking: Reported on 07/23/2020), Disp: 4 g, Rfl: 1 .  lisinopril (ZESTRIL) 5 MG tablet, TAKE 1  TABLET (5 MG TOTAL) BY MOUTH DAILY. FOR BP AND KIDNEY PROTECTION, Disp: 90 tablet, Rfl: 0 .  meclizine (ANTIVERT) 25 MG tablet, Take 1 tablet (25 mg total) by mouth daily as needed for dizziness., Disp: 30 tablet, Rfl: 0 .  metoprolol succinate (TOPROL-XL) 25 MG 24 hr tablet, TAKE 1 TABLET (25 MG TOTAL) BY MOUTH DAILY. TAKE WITH OR IMMEDIATELY FOLLOWING A MEAL., Disp: 90 tablet, Rfl: 0 .  rosuvastatin (CRESTOR) 40 MG tablet, TAKE 1 TABLET BY MOUTH EVERY DAY, Disp: 90 tablet, Rfl: 0  No Known Allergies  I personally reviewed {Reviewed:[id number]} with the patient/caregiver today.   ROS  ***  Objective  There were no vitals filed for this visit.  There is no height or weight on file to calculate  BMI.  Physical Exam ***  No results found for this or any previous visit (from the past 2160 hour(s)).  Diabetic Foot Exam: Diabetic Foot Exam - Simple   No data filed    ***  PHQ2/9: Depression screen [facility] 2/9 04/23/2020 08/20/2019 06/11/2019 02/06/2019 06/28/2018  Decreased Interest 0 0 0 0 0  Down, Depressed, Hopeless 0 0 0 0 0  PHQ - 2 Score 0 0 0 0 0  Altered sleeping 0 - 0 0 0  Tired, decreased energy 0 - 0 0 0  Change in appetite 0 - 0 0 0  Feeling bad or failure about yourself  0 - 0 0 0  Trouble concentrating 0 - 0 0 0  Moving slowly or fidgety/restless 0 - 0 0 0  Suicidal thoughts 0 - 0 0 -  PHQ-9 Score 0 - 0 0 0  Difficult doing work/chores - - - - -    phq 9 is {gen pos [id number]} ***  Fall Risk: Fall Risk  04/23/2020 08/20/2019 06/11/2019 02/06/2019 06/28/2018  Falls in the past year? 0 0 0 0 No  Number falls in past yr: 0 0 0 0 -  Injury with Fall? 0 0 0 0 -  Follow up - Falls prevention discussed - - -   ***   Functional Status Survey:   ***   Assessment & Plan  *** There are no diagnoses linked to this encounter.

## 2020-10-20 ENCOUNTER — Telehealth: Payer: Self-pay | Admitting: *Deleted

## 2020-10-20 NOTE — Chronic Care Management (AMB) (Signed)
  Care Management   Note  10/20/2020 Name: ERYCK NEGRON MRN: [id number] DOB: Mar 12, 1941  ANDREU DRUDGE is a 79 y.o. year old male who is a primary care patient of Alba Cory, MD and is actively engaged with the care management team. I reached out to [name] by phone today to assist with re-scheduling a follow up visit with the Pharmacist  Follow up plan: Unsuccessful telephone outreach attempt made. The care management team will reach out to the patient again over the next 7 days. If patient returns call to provider office, please advise to call Embedded Care Management Care Guide Gwenevere Ghazi at [phone].  Gwenevere Ghazi  Care Guide, Embedded Care Coordination [facility] Management  Direct Dial: [phone]

## 2020-10-26 ENCOUNTER — Ambulatory Visit: Payer: Medicare HMO | Admitting: Family Medicine

## 2020-10-27 NOTE — Chronic Care Management (AMB) (Signed)
  Care Management   Note  10/27/2020 Name: Eric [name] MRN: [id number] DOB: 1941-01-13  Eric [name] is a 80 y.o. year old male who is a primary care patient of Alba Cory, MD and is actively engaged with the care management team. I reached out to [name] by phone today to assist with re-scheduling a follow up visit with the Pharmacist  Follow up plan: Telephone appointment with care management team member scheduled for:11/26/2020  [facility] Guide, Embedded Care Coordination [facility]  Care Management  Direct Dial: [phone]

## 2020-11-09 ENCOUNTER — Telehealth: Payer: Self-pay | Admitting: Family Medicine

## 2020-11-09 NOTE — Telephone Encounter (Signed)
Copied from [place] [phone]. Topic: Medicare AWV >> Nov 09, 2020 11:20 AM Cher Nakai R wrote: Reason for CRM: No answer unable to leave a message for patient to call back and schedule Medicare Annual Wellness Visit (AWV) in office.   If not able to come in office, please offer to do virtually.   Last AWV 08/20/2019  Please schedule at anytime with [place].  40 minute appointment  Any questions, please contact me at [phone]

## 2020-11-10 ENCOUNTER — Telehealth: Payer: Self-pay

## 2020-11-25 ENCOUNTER — Telehealth: Payer: Self-pay

## 2020-11-25 NOTE — Chronic Care Management (AMB) (Signed)
11/25/2020- Called patient to remind of appointment with Junius Argyle, CPP on 11/26/2020 at 1:00 pm. No answer, unable to leave a message regarding appointment details, voicemail full. Junius Argyle, CPP notified.  Pattricia Boss, [place] Pharmacist Assistant [phone]

## 2020-11-26 ENCOUNTER — Telehealth: Payer: Self-pay

## 2020-11-26 NOTE — Chronic Care Management (AMB) (Deleted)
Chronic Care Management Pharmacy  Name: Eric [name]  MRN: [id number] DOB: 1941-06-24  Chief Complaint/ HPI  Eric [name],  80 y.o. , male presents for their Follow-Up CCM visit with the clinical pharmacist via telephone.  PCP : Steele Sizer, MD Patient Care Team: Steele Sizer, MD as PCP - General (Family Medicine) Germaine Pomfret, [facility] (Pharmacist)  Their chronic conditions include: HTN, HLD, COPD  CAD, CKD, Tobacco use   Office Visits: 7/1 aorta atherosclerosis, [place], BP 112/70 P 92 Wt 146.5 BMI 23.6, current smoker, no inhaler, A1c inc 6.1%, start low dose ARB/ACEI, statin nonadherrent  Consult Visit: NA  Medications: Outpatient Encounter Medications as of 11/26/2020  Medication Sig  . acetaminophen (TYLENOL) 500 MG tablet Take 1 tablet (500 mg total) by mouth every 6 (six) hours as needed.  Marland Kitchen aspirin 81 MG chewable tablet Chew 81 mg by mouth daily.  Marland Kitchen ezetimibe (ZETIA) 10 MG tablet Take 1 tablet (10 mg total) by mouth daily.  . Ipratropium-Albuterol (COMBIVENT) 20-100 MCG/ACT AERS respimat Inhale 1 puff into the lungs every 6 (six) hours. (Patient not taking: Reported on 07/23/2020)  . lisinopril (ZESTRIL) 5 MG tablet TAKE 1 TABLET (5 MG TOTAL) BY MOUTH DAILY. FOR BP AND KIDNEY PROTECTION  . meclizine (ANTIVERT) 25 MG tablet Take 1 tablet (25 mg total) by mouth daily as needed for dizziness.  . metoprolol succinate (TOPROL-XL) 25 MG 24 hr tablet TAKE 1 TABLET (25 MG TOTAL) BY MOUTH DAILY. TAKE WITH OR IMMEDIATELY FOLLOWING A MEAL.  . rosuvastatin (CRESTOR) 40 MG tablet TAKE 1 TABLET BY MOUTH EVERY DAY   No facility-administered encounter medications on file as of 11/26/2020.   Financial Resource Strain: High Risk  . Difficulty of Paying Living Expenses: Hard    Current Diagnosis/Assessment:  Goals Addressed   None     Hypertension   BP goal is:  <140/90  Office blood pressures are  BP Readings from Last 3 Encounters:  04/23/20 112/70   06/28/18 (!) 148/64  05/09/18 138/82   Patient checks BP at home infrequently Patient home BP readings are ranging: NA  Patient has failed these meds in the past: NA Patient is currently controlled on the following medications:  . Lisinopril 39m daily . Metoprolol succinate 219mdaily  We discussed: At goal Denies hypotension Aware lisinopril is a kidney protection dose  Plan  Continue current medications   Hyperlipidemia   LDL goal < 100  Lipid Panel     Component Value Date/Time   CHOL 245 (H) 04/23/2020 1208   CHOL 150 08/17/2015 0928   TRIG 230 (H) 04/23/2020 1208   HDL 34 (L) 04/23/2020 1208   HDL 35 (L) 08/17/2015 0928   LDLCALC 172 (H) 04/23/2020 1208    Hepatic Function Latest Ref Rng & Units 04/23/2020 05/09/2018 11/02/2017  Total Protein 6.1 - 8.1 g/dL 7.2 7.1 6.8  Albumin 3.6 - 5.1 g/dL - - -  AST 10 - 35 U/L '12 13 10  ' ALT 9 - 46 U/L 9 11 7(L)  Alk Phosphatase 40 - 115 U/L - - -  Total Bilirubin 0.2 - 1.2 mg/dL 0.4 0.4 0.4     The 10-year ASCVD risk score (GMikey BussingC Jr., et al., 2013) is: 35.7%   Values used to calculate the score:     Age: [age]     Sex: Male     Is Non-Hispanic African American: Yes     Diabetic: No     Tobacco smoker: Yes  Systolic Blood Pressure: 144 mmHg     Is BP treated: Yes     HDL Cholesterol: 34 mg/dL     Total Cholesterol: 245 mg/dL   Patient has failed these meds in past: NA Patient is currently uncontrolled on the following medications:  . Aspirin 81 mg daily  . Rosuvastatin 59m daily  . Zetia 10 mg daily   We discussed:    Plan  Continue current medications  COPD    Eosinophil count:   Lab Results  Component Value Date/Time   EOSPCT 0.6 04/23/2020 12:08 PM  %                               Eos (Absolute):  Lab Results  Component Value Date/Time   EOSABS 58 04/23/2020 12:08 PM    Tobacco Status:  Social History   Tobacco Use  Smoking Status Current Every Day Smoker  . Packs/day: 0.50  .  Years: 60.00  . Pack years: 30.00  . Types: Cigarettes  Smokeless Tobacco Never Used   Patient has failed these meds in past: Combivent (cost) Patient is currently controlled on the following medications:  medications:  . Combivent 1 puff q6hr PRN  .  Using maintenance inhaler regularly? No Frequency of rescue inhaler use:  never  We discussed:  smoking cessation Rarely uses Combivent due to cost, but also feels like he doesn't need it as much lately  Plan  Continue current medications  ***   Patient has failed these meds in past: *** Patient is currently {CHL Controlled/[phone]} on the following medications:  . APAP 500 mg q6hr PRN  . Meclizine 25 mg daily PRN   We discussed:  ***  Plan  Continue {CHL [facility] Pharmacy Plans:[phone]}   Medication Management   Pt uses [facility] for all medications Uses pill box? Yes Pt endorses 90% compliance  We discussed:   Plan  Continue current medication management strategy  Follow up: 4 month phone visit  [phone]

## 2020-12-16 ENCOUNTER — Telehealth: Payer: Self-pay

## 2020-12-16 NOTE — Progress Notes (Signed)
Chronic Care Management Pharmacy Assistant   Name: Eric [name]  MRN: [id number] DOB: 01-May-1941  Reason for [place] Call.  Patient Questions:  1.  Have you seen any other providers since your last visit? No  2.  Any changes in your medicines or health? No   PCP : Steele Sizer, MD  Allergies:  No Known Allergies  Medications: Outpatient Encounter Medications as of 12/16/2020  Medication Sig  . acetaminophen (TYLENOL) 500 MG tablet Take 1 tablet (500 mg total) by mouth every 6 (six) hours as needed.  Marland Kitchen aspirin 81 MG chewable tablet Chew 81 mg by mouth daily.  Marland Kitchen ezetimibe (ZETIA) 10 MG tablet Take 1 tablet (10 mg total) by mouth daily.  . Ipratropium-Albuterol (COMBIVENT) 20-100 MCG/ACT AERS respimat Inhale 1 puff into the lungs every 6 (six) hours. (Patient not taking: Reported on 07/23/2020)  . lisinopril (ZESTRIL) 5 MG tablet TAKE 1 TABLET (5 MG TOTAL) BY MOUTH DAILY. FOR BP AND KIDNEY PROTECTION  . meclizine (ANTIVERT) 25 MG tablet Take 1 tablet (25 mg total) by mouth daily as needed for dizziness.  . metoprolol succinate (TOPROL-XL) 25 MG 24 hr tablet TAKE 1 TABLET (25 MG TOTAL) BY MOUTH DAILY. TAKE WITH OR IMMEDIATELY FOLLOWING A MEAL.  . rosuvastatin (CRESTOR) 40 MG tablet TAKE 1 TABLET BY MOUTH EVERY DAY   No facility-administered encounter medications on file as of 12/16/2020.    Current Diagnosis: Patient Active Problem List   Diagnosis Date Noted  . Coronary artery calcification seen on CAT scan 06/28/2018  . Centrilobular emphysema ([place]) 06/28/2018  . Chronic kidney disease, stage III (moderate) ([place]) 06/28/2018  . Atherosclerosis of aorta ([place]) 06/19/2018  . Chronic low back pain with sciatica 02/07/2018  . Hyperglycemia 05/03/2017  . Simple chronic bronchitis ([place]) 05/07/2015  . Essential hypertension 05/07/2015  . Hyperlipidemia 05/07/2015  . Tobacco abuse 05/07/2015    Goals Addressed   None    Reviewed chart prior  to disease state call. Spoke with patient regarding BP  Recent Office Vitals: BP Readings from Last 3 Encounters:  04/23/20 112/70  06/28/18 (!) 148/64  05/09/18 138/82   Pulse Readings from Last 3 Encounters:  04/23/20 92  06/28/18 91  05/09/18 88    Wt Readings from Last 3 Encounters:  07/15/20 146 lb (66.2 kg)  04/23/20 146 lb 4.8 oz (66.4 kg)  07/02/19 140 lb (63.5 kg)     Kidney Function Lab Results  Component Value Date/Time   CREATININE 1.66 (H) 04/23/2020 12:08 PM   CREATININE 1.39 (H) 05/09/2018 11:55 AM   GFRNONAA 39 (L) 04/23/2020 12:08 PM   GFRAA 45 (L) 04/23/2020 12:08 PM    BMP Latest Ref Rng & Units 04/23/2020 05/09/2018 11/02/2017  Glucose 65 - 99 mg/dL 94 84 106(H)  BUN 7 - 25 mg/dL 29(H) 15 16  Creatinine 0.70 - 1.18 mg/dL 1.66(H) 1.39(H) 1.50(H)  BUN/Creat Ratio 6 - 22 (calc) 17 11 11   Sodium 135 - 146 mmol/L 136 137 139  Potassium 3.5 - 5.3 mmol/L 4.4 4.6 5.3  Chloride 98 - 110 mmol/L 100 100 104  CO2 20 - 32 mmol/L 28 29 29   Calcium 8.6 - 10.3 mg/dL 9.9 9.9 9.8    . Current antihypertensive regimen:   Lisinopril 5mg  daily  Metoprolol succinate 25mg  daily  . How often are you checking your Blood Pressure? Patient states he does not check his blood pressure at home because he does not have a montior. . Current home  BP readings: None ID . What recent interventions/DTPs have been made by any provider to improve Blood Pressure control since last CPP Visit: None ID  . Any recent hospitalizations or ED visits since last visit with CPP? No . What diet changes have been made to improve Blood Pressure Control?  o Patient states he has not change in his diet.Patient states he eats good food. . What exercise is being done to improve your Blood Pressure Control?  o Patient states he does not exercise as he should.  Adherence Review: Is the patient currently on ACE/ARB medication? Yes Does the patient have >5 day gap between last estimated fill dates?  Yes   Reviewed chart and adherence measures. Per insurance data patient is 100 % adherent to rosuvastatin for cholesterol and 100% adherent to Lisinopril for hypertension .    Maryjean Ka  Follow-Up:  Pharmacist Review   [place] Clinical Pharmacist Assistant [phone]

## 2020-12-17 ENCOUNTER — Telehealth: Payer: Self-pay | Admitting: Family Medicine

## 2020-12-17 NOTE — Telephone Encounter (Signed)
Copied from [place] [id number]. Topic: Medicare AWV >> Dec 17, 2020  3:08 PM Cher Nakai R wrote: Reason for CRM:  Left message for patient to call back and schedule Medicare Annual Wellness Visit (AWV) in office.   If unable to come into the office for AWV,  please offer to do virtually or by telephone.  Last AWV: 08/20/2019  Please schedule at anytime with [place].  40 minute appointment  Any questions, please contact me at [phone]

## 2020-12-30 ENCOUNTER — Telehealth: Payer: Self-pay

## 2020-12-30 NOTE — Progress Notes (Signed)
Unable to leave a voice message to  confirmed patient telephone appointment on 12/31/2020 for CCM at 11:00 am with Junius Argyle the Clinical pharmacist.   [place] Pharmacist Assistant [phone]

## 2020-12-31 ENCOUNTER — Telehealth: Payer: Medicare HMO

## 2020-12-31 NOTE — Progress Notes (Deleted)
  Chronic Care Management   Outreach Note  12/31/2020 Name: Eric [name] MRN: [id number] DOB: [birth year]  Referred by: Steele Sizer, MD Reason for referral : No chief complaint on file.   Third unsuccessful telephone outreach was attempted today. The patient was referred to the pharmacist for assistance with care management and care coordination.   Follow Up Plan: Patient was dis-enrolled from CCM services due to three successive no-shows for CPP appointments. Please re-consult if CCM pharmacy services are requested.   [facility] [phone]

## 2021-01-19 ENCOUNTER — Telehealth: Payer: Self-pay

## 2021-01-19 NOTE — Progress Notes (Signed)
Spoke to patient to confirmed patient telephone appointment on 01/20/2021 for CCM at 1:00 pm with Junius Argyle the Clinical pharmacist.   Patient Verbalized understanding and denies any side effects from any of his current medications.  [place] Pharmacist Assistant [phone]

## 2021-01-20 ENCOUNTER — Telehealth: Payer: Medicare HMO

## 2021-01-20 NOTE — Telephone Encounter (Signed)
  Chronic Care Management   Outreach Note  01/20/2021 Name: Eric [name] MRN: [id number] DOB: 04-15-1941  Referred by: Steele Sizer, MD Reason for referral : No chief complaint on file.   Third unsuccessful telephone outreach was attempted today. The patient was referred to the pharmacist for assistance with care management and care coordination.   Follow Up Plan: Patient was dis-enrolled from CCM services due to three successive no-shows for CPP appointments. Please re-consult if CCM pharmacy services are requested.   [facility] [phone]

## 2021-02-02 ENCOUNTER — Ambulatory Visit (INDEPENDENT_AMBULATORY_CARE_PROVIDER_SITE_OTHER): Payer: Medicare HMO

## 2021-02-02 DIAGNOSIS — Z0000 Encounter for general adult medical examination without abnormal findings: Secondary | ICD-10-CM

## 2021-02-02 NOTE — Progress Notes (Signed)
Subjective:   [name] is a 80 y.o. male who presents for Medicare Annual/Subsequent preventive examination.  Virtual Visit via Telephone Note  I connected with  Eric [name] on 02/02/21 at  8:00 AM EDT by telephone and verified that I am speaking with the correct person using two identifiers.  Location: Patient: home Provider: [place] Persons participating in the virtual visit: [place]   I discussed the limitations, risks, security and privacy concerns of performing an evaluation and management service by telephone and the availability of in person appointments. The patient expressed understanding and agreed to proceed.  Interactive audio and video telecommunications were attempted between this nurse and patient, however failed, due to patient having technical difficulties OR patient did not have access to video capability.  We continued and completed visit with audio only.  Some vital signs may be absent or patient reported.   Clemetine Marker, LPN    Review of Systems     Cardiac Risk Factors include: advanced age (>49men, >72 women);dyslipidemia;male gender;hypertension;smoking/ tobacco exposure     Objective:    Today's Vitals   02/02/21 0820  PainSc: 6    There is no height or weight on file to calculate BMI.  Advanced Directives 02/02/2021 08/20/2019 05/08/2017 05/03/2017 03/16/2017 08/13/2015 05/07/2015  Does Patient Have a Medical Advance Directive? No No No No No No No  Would patient like information on creating a medical advance directive? Yes (MAU/Ambulatory/Procedural Areas - Information given) Yes (MAU/Ambulatory/Procedural Areas - Information given) No - Patient declined - - No - patient declined information No - patient declined information    Current Medications (verified) Outpatient Encounter Medications as of 02/02/2021  Medication Sig  . acetaminophen (TYLENOL) 500 MG tablet Take 1 tablet (500 mg total) by mouth every 6 (six)  hours as needed.  Marland Kitchen aspirin 81 MG chewable tablet Chew 81 mg by mouth daily.  Marland Kitchen ezetimibe (ZETIA) 10 MG tablet Take 1 tablet (10 mg total) by mouth daily.  Marland Kitchen lisinopril (ZESTRIL) 5 MG tablet TAKE 1 TABLET (5 MG TOTAL) BY MOUTH DAILY. FOR BP AND KIDNEY PROTECTION  . meclizine (ANTIVERT) 25 MG tablet Take 1 tablet (25 mg total) by mouth daily as needed for dizziness.  . metoprolol succinate (TOPROL-XL) 25 MG 24 hr tablet TAKE 1 TABLET (25 MG TOTAL) BY MOUTH DAILY. TAKE WITH OR IMMEDIATELY FOLLOWING A MEAL.  . rosuvastatin (CRESTOR) 40 MG tablet TAKE 1 TABLET BY MOUTH EVERY DAY  . Ipratropium-Albuterol (COMBIVENT) 20-100 MCG/ACT AERS respimat Inhale 1 puff into the lungs every 6 (six) hours. (Patient not taking: No sig reported)   No facility-administered encounter medications on file as of 02/02/2021.    Allergies (verified) Patient has no known allergies.   History: Past Medical History:  Diagnosis Date  . Hyperlipidemia   . Hypertension    History reviewed. No pertinent surgical history. Family History  Problem Relation Age of Onset  . Heart disease Mother   . COPD Father    Social History   Socioeconomic History  . Marital status: Single    Spouse name: Not on file  . Number of children: 1  . Years of education: Not on file  . Highest education level: High school graduate  Occupational History  . Occupation: retired   Tobacco Use  . Smoking status: Current Every Day Smoker    Packs/day: 0.50    Years: 60.00    Pack years: 30.00    Types: Cigarettes  . Smokeless tobacco: Never  Used  . Tobacco comment: smoking cessation information provided  Vaping Use  . Vaping Use: Never used  Substance and Sexual Activity  . Alcohol use: Yes    Alcohol/week: 0.0 standard drinks  . Drug use: No  . Sexual activity: Yes  Other Topics Concern  . Not on file  Social History Narrative   Single, lives with male friend for the past 60 plus years.    Social Determinants of Health    Financial Resource Strain: Low Risk   . Difficulty of Paying Living Expenses: Not very hard  Food Insecurity: No Food Insecurity  . Worried About Charity fundraiser in the Last Year: Never true  . Ran Out of Food in the Last Year: Never true  Transportation Needs: No Transportation Needs  . Lack of Transportation (Medical): No  . Lack of Transportation (Non-Medical): No  Physical Activity: Inactive  . Days of Exercise per Week: 0 days  . Minutes of Exercise per Session: 0 min  Stress: No Stress Concern Present  . Feeling of Stress : Not at all  Social Connections: Moderately Integrated  . Frequency of Communication with Friends and Family: More than three times a week  . Frequency of Social Gatherings with Friends and Family: Once a week  . Attends Religious Services: More than 4 times per year  . Active Member of Clubs or Organizations: No  . Attends Archivist Meetings: Never  . Marital Status: Living with partner    Tobacco Counseling Ready to quit: Yes Counseling given: Yes Comment: smoking cessation information provided   Clinical Intake:  Pre-visit preparation completed: Yes  Pain : 0-10 Pain Score: 6  Pain Type: Chronic pain Pain Location: Leg Pain Orientation: Right Pain Descriptors / Indicators: Aching,Sore Pain Onset: More than a month ago Pain Frequency: Intermittent     Nutritional Risks: None Diabetes: No  How often do you need to have someone help you when you read instructions, pamphlets, or other written materials from your doctor or pharmacy?: 1 - Never    Interpreter Needed?: No  Information entered by :: Clemetine Marker LPN   Activities of Daily Living In your present state of health, do you have any difficulty performing the following activities: 02/02/2021 04/23/2020  Hearing? Y N  Comment declines hearing aids -  Vision? Y N  Difficulty concentrating or making decisions? N N  Walking or climbing stairs? Y N  Dressing or  bathing? N N  Doing errands, shopping? N N  Preparing Food and eating ? N -  Using the Toilet? N -  In the past six months, have you accidently leaked urine? N -  Do you have problems with loss of bowel control? N -  Managing your Medications? N -  Managing your Finances? N -  Housekeeping or managing your Housekeeping? N -  Some recent data might be hidden    Patient Care Team: Steele Sizer, MD as PCP - General (Family Medicine)  Indicate any recent Medical Services you may have received from other than [facility] providers in the past year (date may be approximate).     Assessment:   This is a routine wellness examination for Victor.  Hearing/Vision screen  Hearing Screening   125Hz  250Hz  500Hz  1000Hz  2000Hz  3000Hz  4000Hz  6000Hz  8000Hz   Right ear:           Left ear:           Comments: Pt c/o mild hearing difficulty in right ear; needs  hearing evaluation  Vision Screening Comments: Due for eye exam at [facility]  Dietary issues and exercise activities discussed: Current Exercise Habits: The patient does not participate in regular exercise at present, Exercise limited by: orthopedic condition(s)  Goals    . Chronic Care Management     CARE PLAN ENTRY (see longitudinal plan of care for additional care plan information)  Current Barriers:  . Chronic Disease Management support, education, and care coordination needs related to Hypertension, Hyperlipidemia, and COPD   Hypertension BP Readings from Last 3 Encounters:  04/23/20 112/70  06/28/18 (!) 148/64  05/09/18 138/82   . Pharmacist Clinical Goal(s): o Over the next 90 days, patient will work with PharmD and providers to maintain BP goal <140/90 . Current regimen:  . Lisinopril 5mg  daily . Metoprolol succinate 25mg  daily  . Interventions: o None . Patient self care activities - Over the next 90 days, patient will: o Check BP weekly, document, and provide at future appointments o Ensure daily salt intake < 2300  mg/day  Hyperlipidemia Lab Results  Component Value Date/Time   LDLCALC 172 (H) 04/23/2020 12:08 PM   . Pharmacist Clinical Goal(s): o Over the next 90 days, patient will work with PharmD and providers to achieve LDL goal < 100 . Current regimen:  o Crestor 40mg  daily (not reflected in lab values) . Interventions: o Please stop eating ice cream every day . Patient self care activities - Over the next 90 days, patient will: o Take rosuvastatin 40mg  every day  COPD . Pharmacist Clinical Goal(s) o Over the next 90 days, patient will work with PharmD and providers to improve breathing . Current regimen:  o Combivent rarely due to cost . Interventions: o Planning for patient assistance programs . Patient self care activities - Over the next 90 days, patient will: o Please provide proof of income for patient assistance applications  Medication management . Pharmacist Clinical Goal(s): o Over the next 90 days, patient will work with PharmD and providers to achieve optimal medication adherence . Current pharmacy: [facility] . Interventions o Comprehensive medication review performed. o Continue current medication management strategy . Patient self care activities - Over the next 90 days, patient will: o Focus on medication adherence by working with Doristine Counter, CMA on patient assistance programs o Take medications as prescribed o Report any questions or concerns to PharmD and/or provider(s)  Initial goal documentation     . Increase water intake     Recommend increasing water intake to 4-6 glasses of water a day.       Depression Screen PHQ 2/9 Scores 02/02/2021 04/23/2020 08/20/2019 06/11/2019 02/06/2019 06/28/2018 05/09/2018  PHQ - 2 Score 0 0 0 0 0 0 0  PHQ- 9 Score - 0 - 0 0 0 2    Fall Risk Fall Risk  02/02/2021 04/23/2020 08/20/2019 06/11/2019 02/06/2019  Falls in the past year? 0 0 0 0 0  Number falls in past yr: 0 0 0 0 0  Injury with Fall? 0 0 0 0 0  Risk for fall due to :  Impaired balance/gait - - - -  Follow up Falls prevention discussed - Falls prevention discussed - -    FALL RISK PREVENTION PERTAINING TO THE HOME:  Any stairs in or around the home? Yes  If so, are there any without handrails? No  Home free of loose throw rugs in walkways, pet beds, electrical cords, etc? Yes  Adequate lighting in your home to reduce risk of falls? Yes  ASSISTIVE DEVICES UTILIZED TO PREVENT FALLS:  Life alert? No  Use of a cane, walker or w/c? Yes  Grab bars in the bathroom? Yes  Shower chair or bench in shower? Yes  Elevated toilet seat or a handicapped toilet? Yes   TIMED UP AND GO:  Was the test performed? No . Telephonic visit.   Cognitive Function: Normal cognitive status assessed by direct observation by this Nurse Health Advisor. No abnormalities found.       6CIT Screen 08/20/2019 05/08/2017  What Year? 0 points 0 points  What month? 0 points 0 points  What time? 0 points 0 points  Count back from 20 0 points 0 points  Months in reverse 4 points 4 points  Repeat phrase 2 points 0 points  Total Score 6 4    Immunizations Immunization History  Administered Date(s) Administered  . DTaP 08/23/2010  . Influenza, High Dose Seasonal PF 11/02/2017, 06/28/2018  . Influenza-Unspecified 10/07/2015  . Moderna Sars-Covid-2 Vaccination 12/02/2019, 01/01/2020  . Pneumococcal Conjugate-13 04/15/2014  . Pneumococcal Polysaccharide-23 08/23/2010    TDAP status: Due, Education has been provided regarding the importance of this vaccine. Advised may receive this vaccine at local pharmacy or Health Dept. Aware to provide a copy of the vaccination record if obtained from local pharmacy or Health Dept. Verbalized acceptance and understanding.  Flu Vaccine status: Due, Education has been provided regarding the importance of this vaccine. Advised may receive this vaccine at local pharmacy or Health Dept. Aware to provide a copy of the vaccination record if obtained  from local pharmacy or Health Dept. Verbalized acceptance and understanding.  Pneumococcal vaccine status: Up to date  Covid-19 vaccine status: Completed vaccines  Qualifies for Shingles Vaccine? Yes   Zostavax completed No   Shingrix Completed?: No.    Education has been provided regarding the importance of this vaccine. Patient has been advised to call insurance company to determine out of pocket expense if they have not yet received this vaccine. Advised may also receive vaccine at local pharmacy or Health Dept. Verbalized acceptance and understanding.  Screening Tests Health Maintenance  Topic Date Due  . Hepatitis C Screening  Never done  . COVID-19 Vaccine (3 - Booster for Moderna series) 07/03/2020  . TETANUS/TDAP  10/24/2021 (Originally 08/23/2020)  . INFLUENZA VACCINE  05/24/2021  . PNA vac Low Risk Adult  Completed  . HPV VACCINES  Aged Out    Health Maintenance  Health Maintenance Due  Topic Date Due  . Hepatitis C Screening  Never done  . COVID-19 Vaccine (3 - Booster for Moderna series) 07/03/2020    Colorectal cancer screening: No longer required.   Lung Cancer Screening: (Low Dose CT Chest recommended if Age 46-80 years, 30 pack-year currently smoking OR have quit w/in 15years.) does qualify. Completed 07/15/20.   Additional Screening:  Hepatitis C Screening: does qualify; postponed.   Vision Screening: Recommended annual ophthalmology exams for early detection of glaucoma and other disorders of the eye. Is the patient up to date with their annual eye exam?  No  Who is the provider or what is the name of the office in which the patient attends annual eye exams? [facility]  Dental Screening: Recommended annual dental exams for proper oral hygiene  Community Resource Referral / Chronic Care Management: CRR required this visit?  No   CCM required this visit?  No      Plan:     I have personally reviewed and noted the following in the patient's  chart:    . Medical and social history . Use of alcohol, tobacco or illicit drugs  . Current medications and supplements . Functional ability and status . Nutritional status . Physical activity . Advanced directives . List of other physicians . Hospitalizations, surgeries, and ER visits in previous 12 months . Vitals . Screenings to include cognitive, depression, and falls . Referrals and appointments  In addition, I have reviewed and discussed with patient certain preventive protocols, quality metrics, and best practice recommendations. A written personalized care plan for preventive services as well as general preventive health recommendations were provided to patient.     Clemetine Marker, LPN   04/14/6332   Nurse Notes: pt advised due for OV with Dr. Ancil Boozer; scheduled for 03/03/21

## 2021-02-02 NOTE — Patient Instructions (Signed)
Eric [name] , Thank you for taking time to come for your Medicare Wellness Visit. I appreciate your ongoing commitment to your health goals. Please review the following plan we discussed and let me know if I can assist you in the future.   Screening recommendations/referrals: Colonoscopy: no longer required Recommended yearly ophthalmology/optometry visit for glaucoma screening and checkup Recommended yearly dental visit for hygiene and checkup  Vaccinations: Influenza vaccine: due Pneumococcal vaccine: done 04/15/14 Tdap vaccine: due Shingles vaccine: Shingrix discussed. Please contact your pharmacy for coverage information.  Covid-19:  Done 12/02/19 & 01/01/20  Advanced directives: Advance directive discussed with you today. I have provided a copy for you to complete at home and have notarized. Once this is complete please bring a copy in to our office so we can scan it into your chart.  Conditions/risks identified: If you wish to quit smoking, help is available. For free tobacco cessation program offerings call the [facility] at [phone] or Live Well Line at (825)437-0396. You may also visit [email or web address] or email [email or web address] for more information on other programs.   Next appointment: Follow up in one year for your annual wellness visit.   Preventive Care 65 Years and Older, Male Preventive care refers to lifestyle choices and visits with your health care provider that can promote health and wellness. What does preventive care include?  A yearly physical exam. This is also called an annual well check.  Dental exams once or twice a year.  Routine eye exams. Ask your health care provider how often you should have your eyes checked.  Personal lifestyle choices, including:  Daily care of your teeth and gums.  Regular physical activity.  Eating a healthy diet.  Avoiding tobacco and drug use.  Limiting alcohol use.  Practicing safe  sex.  Taking low doses of aspirin every day.  Taking vitamin and mineral supplements as recommended by your health care provider. What happens during an annual well check? The services and screenings done by your health care provider during your annual well check will depend on your age, overall health, lifestyle risk factors, and family history of disease. Counseling  Your health care provider may ask you questions about your:  Alcohol use.  Tobacco use.  Drug use.  Emotional well-being.  Home and relationship well-being.  Sexual activity.  Eating habits.  History of falls.  Memory and ability to understand (cognition).  Work and work Statistician. Screening  You may have the following tests or measurements:  Height, weight, and BMI.  Blood pressure.  Lipid and cholesterol levels. These may be checked every 5 years, or more frequently if you are over 50 years old.  Skin check.  Lung cancer screening. You may have this screening every year starting at age 79 if you have a 30-pack-year history of smoking and currently smoke or have quit within the past 15 years.  Fecal occult blood test (FOBT) of the stool. You may have this test every year starting at age 79.  Flexible sigmoidoscopy or colonoscopy. You may have a sigmoidoscopy every 5 years or a colonoscopy every 10 years starting at age 79.  Prostate cancer screening. Recommendations will vary depending on your family history and other risks.  Hepatitis C blood test.  Hepatitis B blood test.  Sexually transmitted disease (STD) testing.  Diabetes screening. This is done by checking your blood sugar (glucose) after you have not eaten for a while (fasting). You may have this done every 1-3 years.  Abdominal aortic aneurysm (AAA) screening. You may need this if you are a current or former smoker.  Osteoporosis. You may be screened starting at age 79 if you are at high risk. are at high risk. Talk with your health care provider  about your test results, treatment options, and if necessary, the need for more tests. Vaccines  Your health care provider may recommend certain vaccines, such as:  Influenza vaccine. This is recommended every year.  Tetanus, diphtheria, and acellular pertussis (Tdap, Td) vaccine. You may need a Td booster every 10 years.  Zoster vaccine. You may need this after age 79.  Pneumococcal 13-valent conjugate (PCV13) vaccine. One dose is recommended after age 79.  Pneumococcal polysaccharide (PPSV23) vaccine. One dose is recommended after age 79. Talk to your health care provider about which screenings and vaccines you need and how often you need them. This information is not intended to replace advice given to you by your health care provider. Make sure you discuss any questions you have with your health care provider. Document Released: 11/06/2015 Document Revised: 06/29/2016 Document Reviewed: 08/11/2015 Elsevier Interactive Patient Education  2017 [employer] Prevention in the Home Falls can cause injuries. They can happen to people of all ages. There are many things you can do to make your home safe and to help prevent falls. What can I do on the outside of my home?  Regularly fix the edges of walkways and driveways and fix any cracks.  Remove anything that might make you trip as you walk through a door, such as a raised step or threshold.  Trim any bushes or trees on the path to your home.  Use bright outdoor lighting.  Clear any walking paths of anything that might make someone trip, such as rocks or tools.  Regularly check to see if handrails are loose or broken. Make sure that both sides of any steps have handrails.  Any raised decks and porches should have guardrails on the edges.  Have any leaves, snow, or ice cleared regularly.  Use sand or salt on walking paths during winter.  Clean up any spills in your garage right away. This includes oil or grease  spills. What can I do in the bathroom?  Use night lights.  Install grab bars by the toilet and in the tub and shower. Do not use towel bars as grab bars.  Use non-skid mats or decals in the tub or shower.  If you need to sit down in the shower, use a plastic, non-slip stool.  Keep the floor dry. Clean up any water that spills on the floor as soon as it happens.  Remove soap buildup in the tub or shower regularly.  Attach bath mats securely with double-sided non-slip rug tape.  Do not have throw rugs and other things on the floor that can make you trip. What can I do in the bedroom?  Use night lights.  Make sure that you have a light by your bed that is easy to reach.  Do not use any sheets or blankets that are too big for your bed. They should not hang down onto the floor.  Have a firm chair that has side arms. You can use this for support while you get dressed.  Do not have throw rugs and other things on the floor that can make you trip. What can I do in the kitchen?  Clean up any spills right away.  Avoid walking on wet floors.  Keep items that you use  a lot in easy-to-reach places.  If you need to reach something above you, use a strong step stool that has a grab bar.  Keep electrical cords out of the way.  Do not use floor polish or wax that makes floors slippery. If you must use wax, use non-skid floor wax.  Do not have throw rugs and other things on the floor that can make you trip. What can I do with my stairs?  Do not leave any items on the stairs.  Make sure that there are handrails on both sides of the stairs and use them. Fix handrails that are broken or loose. Make sure that handrails are as long as the stairways.  Check any carpeting to make sure that it is firmly attached to the stairs. Fix any carpet that is loose or worn.  Avoid having throw rugs at the top or bottom of the stairs. If you do have throw rugs, attach them to the floor with carpet  tape.  Make sure that you have a light switch at the top of the stairs and the bottom of the stairs. If you do not have them, ask someone to add them for you. What else can I do to help prevent falls?  Wear shoes that:  Do not have high heels.  Have rubber bottoms.  Are comfortable and fit you well.  Are closed at the toe. Do not wear sandals.  If you use a stepladder:  Make sure that it is fully opened. Do not climb a closed stepladder.  Make sure that both sides of the stepladder are locked into place.  Ask someone to hold it for you, if possible.  Clearly mark and make sure that you can see:  Any grab bars or handrails.  First and last steps.  Where the edge of each step is.  Use tools that help you move around (mobility aids) if they are needed. These include:  Canes.  Walkers.  Scooters.  Crutches.  Turn on the lights when you go into a dark area. Replace any light bulbs as soon as they burn out.  Set up your furniture so you have a clear path. Avoid moving your furniture around.  If any of your floors are uneven, fix them.  If there are any pets around you, be aware of where they are.  Review your medicines with your doctor. Some medicines can make you feel dizzy. This can increase your chance of falling. Ask your doctor what other things that you can do to help prevent falls. This information is not intended to replace advice given to you by your health care provider. Make sure you discuss any questions you have with your health care provider. Document Released: 08/06/2009 Document Revised: 03/17/2016 Document Reviewed: 11/14/2014 Elsevier Interactive Patient Education  2017 [employer].

## 2021-03-03 ENCOUNTER — Other Ambulatory Visit: Payer: Self-pay

## 2021-03-03 ENCOUNTER — Ambulatory Visit (INDEPENDENT_AMBULATORY_CARE_PROVIDER_SITE_OTHER): Payer: Medicare HMO | Admitting: Family Medicine

## 2021-03-03 ENCOUNTER — Encounter: Payer: Self-pay | Admitting: Family Medicine

## 2021-03-03 VITALS — BP 122/68 | HR 90 | Temp 98.3°F | Resp 16 | Ht 66.0 in | Wt 148.2 lb

## 2021-03-03 DIAGNOSIS — I700 Atherosclerosis of aorta: Secondary | ICD-10-CM

## 2021-03-03 DIAGNOSIS — N1831 Chronic kidney disease, stage 3a: Secondary | ICD-10-CM

## 2021-03-03 DIAGNOSIS — Z1159 Encounter for screening for other viral diseases: Secondary | ICD-10-CM | POA: Diagnosis not present

## 2021-03-03 DIAGNOSIS — R739 Hyperglycemia, unspecified: Secondary | ICD-10-CM | POA: Diagnosis not present

## 2021-03-03 DIAGNOSIS — E7800 Pure hypercholesterolemia, unspecified: Secondary | ICD-10-CM

## 2021-03-03 DIAGNOSIS — I2510 Atherosclerotic heart disease of native coronary artery without angina pectoris: Secondary | ICD-10-CM | POA: Diagnosis not present

## 2021-03-03 DIAGNOSIS — I10 Essential (primary) hypertension: Secondary | ICD-10-CM

## 2021-03-03 DIAGNOSIS — R42 Dizziness and giddiness: Secondary | ICD-10-CM | POA: Diagnosis not present

## 2021-03-03 DIAGNOSIS — J432 Centrilobular emphysema: Secondary | ICD-10-CM

## 2021-03-03 MED ORDER — MECLIZINE HCL 25 MG PO TABS: 25.0000 mg | ORAL_TABLET | Freq: Every day | ORAL | 0 refills | Status: DC | PRN

## 2021-03-03 MED ORDER — ROSUVASTATIN CALCIUM 40 MG PO TABS: 40.0000 mg | ORAL_TABLET | Freq: Every day | ORAL | 1 refills | Status: DC

## 2021-03-03 MED ORDER — METOPROLOL SUCCINATE ER 25 MG PO TB24: 25.0000 mg | ORAL_TABLET | Freq: Every day | ORAL | 1 refills | Status: DC

## 2021-03-03 MED ORDER — LISINOPRIL 5 MG PO TABS: 5.0000 mg | ORAL_TABLET | Freq: Every day | ORAL | 1 refills | Status: DC

## 2021-03-03 MED ORDER — EZETIMIBE 10 MG PO TABS: 10.0000 mg | ORAL_TABLET | Freq: Every day | ORAL | 1 refills | Status: DC

## 2021-03-03 NOTE — Progress Notes (Signed)
Name: Eric [name]   MRN: [id number]    DOB: [birth year]   Date:03/03/2021       Progress Note  Subjective  Chief Complaint  Follow Up  HPI  Emphysema: only has sob with moderate activity, he is smoking half pack daily, he has been smoking for the past 60 plus years, still smoked half pack daily. He has a smokers cough, usually dry.  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 Reviewed CT chest , he is due for lung cancer screen 06/2021  CKI stage III: discussed avoiding nsaid's, we will monitor for now. No symptoms. Last GFR was lower down from 57 to 45  . He denies pruritis, normal urine output . We will recheck labs today   CAD 3 vessels and aorta atherosclerosis. On CT chest done 06/2018 and repeat in 07/02/2019 He denies chest pain or decrease in exercise tolerance. He is on Crestor and Zetia, states he has been skipping medication to make it last. Not fasting today but states it will be difficulty to come back for labs, so we will do it today .   Hyperglycemia, hgbA1C went up from 5.0% to 6.1%  and today it was 6.3 % , he denies polyphagia, polyuria or polydipsia. We will recheck labs today   HTN:he does not seem to be taking medications daily, should have been out, but states he has it at home, lisinopril and metoprolol, no side effects. No chest pain or palpitation   Vertigo: he has a long history of vertigo, spinning sensation, but no episodes in about one year but he would like to have a refill of Meclizine, sending to pharmacy today, discussed risk of falls.    Patient Active Problem List   Diagnosis Date Noted  . Coronary artery calcification seen on CAT scan 06/28/2018  . Centrilobular emphysema ([place]) 06/28/2018  . Chronic kidney disease, stage III (moderate) ([place]) 06/28/2018  . Atherosclerosis of aorta ([place]) 06/19/2018  . Chronic low back pain with sciatica  02/07/2018  . Hyperglycemia 05/03/2017  . Simple chronic bronchitis ([place]) 05/07/2015  . Essential hypertension 05/07/2015  . Hyperlipidemia 05/07/2015  . Tobacco abuse 05/07/2015    History reviewed. No pertinent surgical history.  Family History  Problem Relation Age of Onset  . Heart disease Mother   . COPD Father     Social History   Tobacco Use  . Smoking status: Current Every Day Smoker    Packs/day: 0.50    Years: 60.00    Pack years: 30.00    Types: Cigarettes  . Smokeless tobacco: Never Used  . Tobacco comment: smoking cessation information provided  Substance Use Topics  . Alcohol use: Yes    Alcohol/week: 0.0 standard drinks     Current Outpatient Medications:  .  acetaminophen (TYLENOL) 500 MG tablet, Take 1 tablet (500 mg total) by mouth every 6 (six) hours as needed., Disp: 30 tablet, Rfl: 0 .  aspirin 81 MG chewable tablet, Chew 81 mg by mouth daily., Disp: , Rfl:  .  ezetimibe (ZETIA) 10 MG tablet, Take 1 tablet (10 mg total) by mouth daily., Disp: 90 tablet, Rfl: 1 .  Ipratropium-Albuterol (COMBIVENT) 20-100 MCG/ACT AERS respimat, Inhale 1 puff into the lungs every 6 (six) hours., Disp: 4 g, Rfl: 1 .  lisinopril (ZESTRIL) 5 MG tablet, TAKE 1 TABLET (5 MG TOTAL) BY MOUTH DAILY. FOR BP  AND KIDNEY PROTECTION, Disp: 90 tablet, Rfl: 0 .  meclizine (ANTIVERT) 25 MG tablet, Take 1 tablet (25 mg total) by mouth daily as needed for dizziness., Disp: 30 tablet, Rfl: 0 .  metoprolol succinate (TOPROL-XL) 25 MG 24 hr tablet, TAKE 1 TABLET (25 MG TOTAL) BY MOUTH DAILY. TAKE WITH OR IMMEDIATELY FOLLOWING A MEAL., Disp: 90 tablet, Rfl: 0 .  rosuvastatin (CRESTOR) 40 MG tablet, TAKE 1 TABLET BY MOUTH EVERY DAY, Disp: 90 tablet, Rfl: 0  No Known Allergies  I personally reviewed active problem list, medication list, allergies, family history, social history, health maintenance, notes from last encounter with the patient/caregiver today.   ROS  Constitutional: Negative  for fever or weight change.  Respiratory: Negative for cough and shortness of breath.   Cardiovascular: Negative for chest pain or palpitations.  Gastrointestinal: Negative for abdominal pain, no bowel changes.  Musculoskeletal: Negative for gait problem or joint swelling.  Skin: Negative for rash.  Neurological: positive  for dizziness but no headache.  No other specific complaints in a complete review of systems (except as listed in HPI above).  Objective  Vitals:   03/03/21 0953  BP: 122/68  Pulse: 90  Resp: 16  Temp: 98.3 F (36.8 C)  SpO2: 98%  Weight: 148 lb 3.2 oz (67.2 kg)  Height: 5\' 6"  (1.676 m)    Body mass index is 23.92 kg/m.  Physical Exam  Constitutional: Patient appears well-developed and well-nourished.  No distress.  HEENT: head atraumatic, normocephalic, pupils equal and reactive to light, neck supple Cardiovascular: Normal rate, regular rhythm and normal heart sounds.  No murmur heard. No BLE edema. Pulmonary/Chest: Effort normal and breath sounds normal. No respiratory distress. Abdominal: Soft.  There is no tenderness. Psychiatric: Patient has a normal mood and affect. behavior is normal. Judgment and thought content normal.  PHQ2/9: Depression screen [facility] 2/9 03/03/2021 02/02/2021 04/23/2020 08/20/2019 06/11/2019  Decreased Interest 1 0 0 0 0  Down, Depressed, Hopeless 0 0 0 0 0  PHQ - 2 Score 1 0 0 0 0  Altered sleeping 0 - 0 - 0  Tired, decreased energy 0 - 0 - 0  Change in appetite 0 - 0 - 0  Feeling bad or failure about yourself  0 - 0 - 0  Trouble concentrating 0 - 0 - 0  Moving slowly or fidgety/restless 0 - 0 - 0  Suicidal thoughts 0 - 0 - 0  PHQ-9 Score 1 - 0 - 0  Difficult doing work/chores Not difficult at all - - - -    phq 9 is positive   Fall Risk: Fall Risk  03/03/2021 02/02/2021 04/23/2020 08/20/2019 06/11/2019  Falls in the past year? 0 0 0 0 0  Number falls in past yr: 0 0 0 0 0  Injury with Fall? 0 0 0 0 0  Risk for fall due to :  - Impaired balance/gait - - -  Follow up - Falls prevention discussed - Falls prevention discussed -     Functional Status Survey: Is the patient deaf or have difficulty hearing?: Yes Does the patient have difficulty seeing, even when wearing glasses/contacts?: Yes Does the patient have difficulty concentrating, remembering, or making decisions?: No Does the patient have difficulty walking or climbing stairs?: No Does the patient have difficulty dressing or bathing?: No Does the patient have difficulty doing errands alone such as visiting a doctor's office or shopping?: No    Assessment & Plan  1. Essential hypertension  - metoprolol  succinate (TOPROL-XL) 25 MG 24 hr tablet; Take 1 tablet (25 mg total) by mouth daily. Take with or immediately following a meal.  Dispense: 90 tablet; Refill: 1 - lisinopril (ZESTRIL) 5 MG tablet; Take 1 tablet (5 mg total) by mouth daily. For bp and kidney protection  Dispense: 90 tablet; Refill: 1  2. Pure hypercholesterolemia  - Lipid panel - rosuvastatin (CRESTOR) 40 MG tablet; Take 1 tablet (40 mg total) by mouth daily.  Dispense: 90 tablet; Refill: 1 - ezetimibe (ZETIA) 10 MG tablet; Take 1 tablet (10 mg total) by mouth daily.  Dispense: 90 tablet; Refill: 1  3. Atherosclerosis of aorta (HCC)  - Lipid panel  4. Centrilobular emphysema ([place])  Cannot afford medication   5. Hyperglycemia  - Hemoglobin A1c  6. Coronary artery calcification seen on CAT scan   7. Stage 3a chronic kidney disease (HCC)  - Microalbumin / creatinine urine ratio - COMPLETE METABOLIC PANEL WITH GFR - CBC with Differential/Platelet - Parathyroid hormone, intact (no Ca) - lisinopril (ZESTRIL) 5 MG tablet; Take 1 tablet (5 mg total) by mouth daily. For bp and kidney protection  Dispense: 90 tablet; Refill: 1  8. Vertigo  - meclizine (ANTIVERT) 25 MG tablet; Take 1 tablet (25 mg total) by mouth daily as needed for dizziness.  Dispense: 30 tablet; Refill:  0  9. Need for hepatitis C screening test  - Hepatitis C Antibody

## 2021-03-04 LAB — COMPLETE METABOLIC PANEL WITH GFR
AG Ratio: 1.6 (calc) (ref 1.0–2.5)
ALT: 11 U/L (ref 9–46)
AST: 12 U/L (ref 10–35)
Albumin: 4.4 g/dL (ref 3.6–5.1)
Alkaline phosphatase (APISO): 76 U/L (ref 35–144)
BUN/Creatinine Ratio: 14 (calc) (ref 6–22)
BUN: 19 mg/dL (ref 7–25)
CO2: 28 mmol/L (ref 20–32)
Calcium: 10.1 mg/dL (ref 8.6–10.3)
Chloride: 105 mmol/L (ref 98–110)
Creat: 1.33 mg/dL — ABNORMAL HIGH (ref 0.70–1.18)
GFR, Est African American: 59 mL/min/{1.73_m2} — ABNORMAL LOW (ref >=60)
GFR, Est Non African American: 50 mL/min/{1.73_m2} — ABNORMAL LOW (ref >=60)
Globulin: 2.7 g/dL (calc) (ref 1.9–3.7)
Glucose, Bld: 81 mg/dL (ref 65–99)
Potassium: 5.2 mmol/L (ref 3.5–5.3)
Sodium: 141 mmol/L (ref 135–146)
Total Bilirubin: 0.3 mg/dL (ref 0.2–1.2)
Total Protein: 7.1 g/dL (ref 6.1–8.1)

## 2021-03-04 LAB — CBC WITH DIFFERENTIAL/PLATELET
Absolute Monocytes: 558 cells/uL (ref 200–950)
Basophils Absolute: 27 cells/uL (ref 0–200)
Basophils Relative: 0.3 %
Eosinophils Absolute: 162 cells/uL (ref 15–500)
Eosinophils Relative: 1.8 %
HCT: 44.3 % (ref 38.5–50.0)
Hemoglobin: 14.5 g/dL (ref 13.2–17.1)
Lymphs Abs: 2268 cells/uL (ref 850–3900)
MCH: 31.3 pg (ref 27.0–33.0)
MCHC: 32.7 g/dL (ref 32.0–36.0)
MCV: 95.7 fL (ref 80.0–100.0)
MPV: 9.9 fL (ref 7.5–12.5)
Monocytes Relative: 6.2 %
Neutro Abs: 5985 cells/uL (ref 1500–7800)
Neutrophils Relative %: 66.5 %
Platelets: 346 10*3/uL (ref 140–400)
RBC: 4.63 10*6/uL (ref 4.20–5.80)
RDW: 12.3 % (ref 11.0–15.0)
Total Lymphocyte: 25.2 %
WBC: 9 10*3/uL (ref 3.8–10.8)

## 2021-03-04 LAB — PARATHYROID HORMONE, INTACT (NO CA): PTH: 20 pg/mL (ref 16–77)

## 2021-03-04 LAB — HEMOGLOBIN A1C
Hgb A1c MFr Bld: 6.3 % of total Hgb — ABNORMAL HIGH (ref <5.7)
Mean Plasma Glucose: 134 mg/dL
eAG (mmol/L): 7.4 mmol/L

## 2021-03-04 LAB — MICROALBUMIN / CREATININE URINE RATIO
Creatinine, Urine: 131 mg/dL (ref 20–320)
Microalb Creat Ratio: 15 mcg/mg creat (ref <30)
Microalb, Ur: 1.9 mg/dL

## 2021-03-04 LAB — LIPID PANEL
Cholesterol: 165 mg/dL (ref <200)
HDL: 49 mg/dL (ref >=40)
LDL Cholesterol (Calc): 94 mg/dL (calc)
Non-HDL Cholesterol (Calc): 116 mg/dL (calc) (ref <130)
Total CHOL/HDL Ratio: 3.4 (calc) (ref <5.0)
Triglycerides: 120 mg/dL (ref <150)

## 2021-03-04 LAB — HEPATITIS C ANTIBODY
Hepatitis C Ab: NONREACTIVE
SIGNAL TO CUT-OFF: 0.01 (ref <1.00)

## 2021-07-11 ENCOUNTER — Other Ambulatory Visit: Payer: Self-pay | Admitting: Family Medicine

## 2021-07-11 DIAGNOSIS — R42 Dizziness and giddiness: Secondary | ICD-10-CM

## 2021-09-02 NOTE — Progress Notes (Signed)
Name: Eric [name]   MRN: [id number]    DOB: 1941-09-27   Date:09/03/2021       Progress Note  Subjective  Chief Complaint  Follow Up  HPI  Emphysema: only has sob with moderate activity, he is smoking half pack daily, he has been smoking for the past 60 plus years, still smoked half pack daily. He has a smokers cough, usually dry.  He denies wheezing.We gave him Combivent but only prn . Reviewed CT chest , he was due for lung cancer screen 06/2021 but he did not get a reminder call. I will place another referral    CKI stage III: discussed avoiding nsaid's, we will monitor for now. No symptoms. GFR is stable between mid 40's to 50's. He denies pruritis, normal urine output   CAD 3 vessels and aorta atherosclerosis. On CT chest done 06/2018 and repeat in 07/02/2019  He denies chest pain or decrease in exercise tolerance. He is on Crestor and Zetia last LDL was down from 172 down to 94 but he states at the time he was not taking medication consistently. He takes aspirin 81 mg daily   Hyperglycemia, hgbA1C went up from 5.0% to 6.1% 6.3 %  last visit , he denies polyphagia, polyuria or polydipsia.   HTN: he does not seem to be taking medications daily, should have been out, but states he has it at home, lisinopril and metoprolol, no side effects. No chest pain or palpitation   Vertigo: he has a long history of vertigo, spinning sensation, he still has Meclizine at home . He states sometimes feels off balanced and uses a cane, discussed vestibular rehab but he is not interested at this time  LUTS: symptoms started months ago and is getting worse, he has dribbling and has to strain to start voiding. No blood in urine or dysuria.   Patient Active Problem List   Diagnosis Date Noted   Coronary artery calcification seen on CAT scan 06/28/2018   Centrilobular emphysema ([place]) 06/28/2018   Chronic kidney disease, stage III (moderate) ([place]) 06/28/2018   Atherosclerosis of aorta ([place]) 06/19/2018    Chronic low back pain with sciatica 02/07/2018   Hyperglycemia 05/03/2017   Simple chronic bronchitis ([place]) 05/07/2015   Essential hypertension 05/07/2015   Hyperlipidemia 05/07/2015   Tobacco abuse 05/07/2015    No past surgical history on file.  Family History  Problem Relation Age of Onset   Heart disease Mother    COPD Father     Social History   Tobacco Use   Smoking status: Every Day    Packs/day: 0.50    Years: 60.00    Pack years: 30.00    Types: Cigarettes   Smokeless tobacco: Never   Tobacco comments:    smoking cessation information provided  Substance Use Topics   Alcohol use: Yes    Alcohol/week: 0.0 standard drinks     Current Outpatient Medications:    acetaminophen (TYLENOL) 500 MG tablet, Take 1 tablet (500 mg total) by mouth every 6 (six) hours as needed., Disp: 30 tablet, Rfl: 0   aspirin 81 MG chewable tablet, Chew 81 mg by mouth daily., Disp: , Rfl:    ezetimibe (ZETIA) 10 MG tablet, Take 1 tablet (10 mg total) by mouth daily., Disp: 90 tablet, Rfl: 1   Ipratropium-Albuterol (COMBIVENT) 20-100 MCG/ACT AERS respimat, Inhale 1 puff into the lungs every 6 (six) hours., Disp: 4 g, Rfl: 1   lisinopril (ZESTRIL) 5 MG tablet, Take 1 tablet (5  mg total) by mouth daily. For bp and kidney protection, Disp: 90 tablet, Rfl: 1   meclizine (ANTIVERT) 25 MG tablet, TAKE 1 TABLET (25 MG TOTAL) BY MOUTH DAILY AS NEEDED FOR DIZZINESS., Disp: 30 tablet, Rfl: 0   metoprolol succinate (TOPROL-XL) 25 MG 24 hr tablet, Take 1 tablet (25 mg total) by mouth daily. Take with or immediately following a meal., Disp: 90 tablet, Rfl: 1   rosuvastatin (CRESTOR) 40 MG tablet, Take 1 tablet (40 mg total) by mouth daily., Disp: 90 tablet, Rfl: 1  No Known Allergies  I personally reviewed active problem list, medication list, allergies, family history, social history, health maintenance with the patient/caregiver today.   ROS  Constitutional: Negative for fever or weight change.   Respiratory: Negative for cough and shortness of breath.   Cardiovascular: Negative for chest pain or palpitations.  Gastrointestinal: Negative for abdominal pain, no bowel changes.  Musculoskeletal: Negative for gait problem or joint swelling.  Skin: Negative for rash.  Neurological: Negative for dizziness or headache.  No other specific complaints in a complete review of systems (except as listed in HPI above).   Objective  Vitals:   09/03/21 1029  BP: 130/70  Pulse: 87  Resp: 16  Temp: 98.2 F (36.8 C)  SpO2: 99%  Weight: 150 lb (68 kg)  Height: 5\' 6"  (1.676 m)    Body mass index is 24.21 kg/m.  Physical Exam  Constitutional: Patient appears well-developed and well-nourished  No distress.  HEENT: head atraumatic, normocephalic, pupils equal and reactive to light, neck supple Cardiovascular: Normal rate, regular rhythm and normal heart sounds.  No murmur heard. No BLE edema. Pulmonary/Chest: Effort normal and breath sounds normal. No respiratory distress. Abdominal: Soft.  There is no tenderness. Psychiatric: Patient has a normal mood and affect. behavior is normal. Judgment and thought content normal.   PHQ2/9: Depression screen [facility] 2/9 09/03/2021 03/03/2021 02/02/2021 04/23/2020 08/20/2019  Decreased Interest 0 1 0 0 0  Down, Depressed, Hopeless 0 0 0 0 0  PHQ - 2 Score 0 1 0 0 0  Altered sleeping 0 0 - 0 -  Tired, decreased energy 0 0 - 0 -  Change in appetite 0 0 - 0 -  Feeling bad or failure about yourself  0 0 - 0 -  Trouble concentrating 0 0 - 0 -  Moving slowly or fidgety/restless 0 0 - 0 -  Suicidal thoughts 0 0 - 0 -  PHQ-9 Score 0 1 - 0 -  Difficult doing work/chores - Not difficult at all - - -  Some recent data might be hidden    phq 9 is negative   Fall Risk: Fall Risk  09/03/2021 03/03/2021 02/02/2021 04/23/2020 08/20/2019  Falls in the past year? 0 0 0 0 0  Number falls in past yr: 0 0 0 0 0  Injury with Fall? 0 0 0 0 0  Risk for fall due to : No  Fall Risks - Impaired balance/gait - -  Follow up Falls prevention discussed - Falls prevention discussed - Falls prevention discussed      Functional Status Survey: Is the patient deaf or have difficulty hearing?: No Does the patient have difficulty seeing, even when wearing glasses/contacts?: No Does the patient have difficulty concentrating, remembering, or making decisions?: No Does the patient have difficulty walking or climbing stairs?: No Does the patient have difficulty dressing or bathing?: No Does the patient have difficulty doing errands alone such as visiting a doctor's office or  shopping?: No    Assessment & Plan  1. Centrilobular emphysema ([place])  Not as compliant with inhaler due to cost   2. Atherosclerosis of aorta (HCC)  On statin and aspirin daily   3. Coronary artery calcification seen on CAT scan  - ezetimibe (ZETIA) 10 MG tablet; Take 1 tablet (10 mg total) by mouth daily.  Dispense: 90 tablet; Refill: 1 - rosuvastatin (CRESTOR) 40 MG tablet; Take 1 tablet (40 mg total) by mouth daily.  Dispense: 90 tablet; Refill: 1 - metoprolol succinate (TOPROL-XL) 25 MG 24 hr tablet; Take 1 tablet (25 mg total) by mouth daily. Take with or immediately following a meal.  Dispense: 90 tablet; Refill: 1  4. Stage 3a chronic kidney disease (HCC)  - lisinopril (ZESTRIL) 5 MG tablet; Take 1 tablet (5 mg total) by mouth daily. For bp and kidney protection  Dispense: 90 tablet; Refill: 1  5. Hyperglycemia   6. Need for immunization against influenza  - Flu Vaccine QUAD High Dose(Fluad)  7. Pure hypercholesterolemia  - ezetimibe (ZETIA) 10 MG tablet; Take 1 tablet (10 mg total) by mouth daily.  Dispense: 90 tablet; Refill: 1 - rosuvastatin (CRESTOR) 40 MG tablet; Take 1 tablet (40 mg total) by mouth daily.  Dispense: 90 tablet; Refill: 1  8. Essential hypertension  - metoprolol succinate (TOPROL-XL) 25 MG 24 hr tablet; Take 1 tablet (25 mg total) by mouth daily. Take  with or immediately following a meal.  Dispense: 90 tablet; Refill: 1 - lisinopril (ZESTRIL) 5 MG tablet; Take 1 tablet (5 mg total) by mouth daily. For bp and kidney protection  Dispense: 90 tablet; Refill: 1  9. Screening for lung cancer  - Ambulatory Referral Lung Cancer Screening [facility]

## 2021-09-03 ENCOUNTER — Encounter: Payer: Self-pay | Admitting: Family Medicine

## 2021-09-03 ENCOUNTER — Ambulatory Visit (INDEPENDENT_AMBULATORY_CARE_PROVIDER_SITE_OTHER): Payer: Medicare HMO | Admitting: Family Medicine

## 2021-09-03 ENCOUNTER — Other Ambulatory Visit: Payer: Self-pay

## 2021-09-03 VITALS — BP 130/70 | HR 87 | Temp 98.2°F | Resp 16 | Ht 66.0 in | Wt 150.0 lb

## 2021-09-03 DIAGNOSIS — I700 Atherosclerosis of aorta: Secondary | ICD-10-CM | POA: Diagnosis not present

## 2021-09-03 DIAGNOSIS — I10 Essential (primary) hypertension: Secondary | ICD-10-CM

## 2021-09-03 DIAGNOSIS — R739 Hyperglycemia, unspecified: Secondary | ICD-10-CM | POA: Diagnosis not present

## 2021-09-03 DIAGNOSIS — J432 Centrilobular emphysema: Secondary | ICD-10-CM

## 2021-09-03 DIAGNOSIS — Z122 Encounter for screening for malignant neoplasm of respiratory organs: Secondary | ICD-10-CM

## 2021-09-03 DIAGNOSIS — E7800 Pure hypercholesterolemia, unspecified: Secondary | ICD-10-CM | POA: Diagnosis not present

## 2021-09-03 DIAGNOSIS — I2510 Atherosclerotic heart disease of native coronary artery without angina pectoris: Secondary | ICD-10-CM

## 2021-09-03 DIAGNOSIS — N1831 Chronic kidney disease, stage 3a: Secondary | ICD-10-CM | POA: Diagnosis not present

## 2021-09-03 DIAGNOSIS — Z23 Encounter for immunization: Secondary | ICD-10-CM | POA: Diagnosis not present

## 2021-09-03 MED ORDER — METOPROLOL SUCCINATE ER 25 MG PO TB24: 25.0000 mg | ORAL_TABLET | Freq: Every day | ORAL | 1 refills | Status: DC

## 2021-09-03 MED ORDER — ROSUVASTATIN CALCIUM 40 MG PO TABS: 40.0000 mg | ORAL_TABLET | Freq: Every day | ORAL | 1 refills | Status: DC

## 2021-09-03 MED ORDER — EZETIMIBE 10 MG PO TABS: 10.0000 mg | ORAL_TABLET | Freq: Every day | ORAL | 1 refills | Status: DC

## 2021-09-03 MED ORDER — LISINOPRIL 5 MG PO TABS: 5.0000 mg | ORAL_TABLET | Freq: Every day | ORAL | 1 refills | Status: DC

## 2021-09-29 NOTE — Progress Notes (Signed)
Name: Eric [name]   MRN: [id number]    DOB: 01-24-1941   Date:09/30/2021       Progress Note  Subjective  Chief Complaint  Annual Exam  HPI  Patient presents for annual CPE .  IPSS Questionnaire (AUA-7): Over the past month.   1)  How often have you had a sensation of not emptying your bladder completely after you finish urinating?  0 - Not at all  2)  How often have you had to urinate again less than two hours after you finished urinating? 0 - Not at all  3)  How often have you found you stopped and started again several times when you urinated?  1 - Less than 1 time in 5  4) How difficult have you found it to postpone urination?  0 - Not at all  5) How often have you had a weak urinary stream?  1 - Less than 1 time in 5  6) How often have you had to push or strain to begin urination?  0 - Not at all  7) How many times did you most typically get up to urinate from the time you went to bed until the time you got up in the morning?  1 - 1 time  Total score:  0-7 mildly symptomatic   8-19 moderately symptomatic   20-35 severely symptomatic     Diet: eating at home or [employer] avoids fast food  Exercise: discussed importance of regular activity   Depression: phq 9 is negative Depression screen [facility] 2/9 09/30/2021 09/03/2021 03/03/2021 02/02/2021 04/23/2020  Decreased Interest 0 0 1 0 0  Down, Depressed, Hopeless 0 0 0 0 0  PHQ - 2 Score 0 0 1 0 0  Altered sleeping 0 0 0 - 0  Tired, decreased energy 0 0 0 - 0  Change in appetite 0 0 0 - 0  Feeling bad or failure about yourself  0 0 0 - 0  Trouble concentrating 0 0 0 - 0  Moving slowly or fidgety/restless 0 0 0 - 0  Suicidal thoughts 0 0 0 - 0  PHQ-9 Score 0 0 1 - 0  Difficult doing work/chores - - Not difficult at all - -  Some recent data might be hidden    Hypertension:  BP Readings from Last 3 Encounters:  09/30/21 132/82  09/03/21 130/70  03/03/21 122/68    Obesity: Wt Readings from Last 3 Encounters:  09/30/21 150 lb  (68 kg)  09/03/21 150 lb (68 kg)  03/03/21 148 lb 3.2 oz (67.2 kg)   BMI Readings from Last 3 Encounters:  09/30/21 24.21 kg/m  09/03/21 24.21 kg/m  03/03/21 23.92 kg/m     Lipids:  Lab Results  Component Value Date   CHOL 165 03/03/2021   CHOL 245 (H) 04/23/2020   CHOL 168 05/09/2018   Lab Results  Component Value Date   HDL 49 03/03/2021   HDL 34 (L) 04/23/2020   HDL 38 (L) 05/09/2018   Lab Results  Component Value Date   LDLCALC 94 03/03/2021   LDLCALC 172 (H) 04/23/2020   LDLCALC 104 (H) 05/09/2018   Lab Results  Component Value Date   TRIG 120 03/03/2021   TRIG 230 (H) 04/23/2020   TRIG 147 05/09/2018   Lab Results  Component Value Date   CHOLHDL 3.4 03/03/2021   CHOLHDL 7.2 (H) 04/23/2020   CHOLHDL 4.4 05/09/2018   No results found for: LDLDIRECT Glucose:  Glucose, Bld  Date  Value Ref Range Status  03/03/2021 81 65 - 99 mg/dL Final    Comment:    .            Fasting reference interval .   04/23/2020 94 65 - 99 mg/dL Final    Comment:    .            Fasting reference interval .   05/09/2018 84 65 - 139 mg/dL Final    Comment:    .        Non-fasting reference interval .     Flowsheet Row Clinical Support from 02/02/2021 [facility]  AUDIT-C Score 0       Single STD testing and prevention (HIV/chl/gon/syphilis): N/A Hep C: 03/03/21  Skin cancer: Discussed monitoring for atypical lesions Colorectal cancer: N/A Prostate cancer: he asked for PSA, discussed not usually indicated at his age , he refused rectal exam, he has a history of hemorrhoids    Lung cancer: Low Dose CT Chest recommended if Age 56-80 years, 30 pack-year currently smoking OR have quit w/in 15years.Order has been placed  AAA: The USPSTF recommends one-time screening with ultrasonography in men ages 25 to 27 years who have ever smoked ECG:  06/28/18  Vaccines:   Shingrix: refused  Pneumonia: educated and discussed with patient. Flu:up to  date Tdap: refused  COVID-19: he will go to local pharmacy for the booster   Advanced Care Planning: A voluntary discussion about advance care planning including the explanation and discussion of advance directives.  Discussed health care proxy and Living will, and the patient was able to identify a health care proxy as daughter   Patient Active Problem List   Diagnosis Date Noted  . Coronary artery calcification seen on CAT scan 06/28/2018  . Centrilobular emphysema ([place]) 06/28/2018  . Chronic kidney disease, stage III (moderate) ([place]) 06/28/2018  . Atherosclerosis of aorta ([place]) 06/19/2018  . Chronic low back pain with sciatica 02/07/2018  . Hyperglycemia 05/03/2017  . Simple chronic bronchitis ([place]) 05/07/2015  . Essential hypertension 05/07/2015  . Hyperlipidemia 05/07/2015  . Tobacco abuse 05/07/2015    No past surgical history on file.  Family History  Problem Relation Age of Onset  . Heart disease Mother   . COPD Father     Social History   Socioeconomic History  . Marital status: Single    Spouse name: Not on file  . Number of children: 1  . Years of education: Not on file  . Highest education level: High school graduate  Occupational History  . Occupation: retired   Tobacco Use  . Smoking status: Every Day    Packs/day: 0.50    Years: 60.00    Pack years: 30.00    Types: Cigarettes  . Smokeless tobacco: Never  . Tobacco comments:    smoking cessation information provided  Vaping Use  . Vaping Use: Never used  Substance and Sexual Activity  . Alcohol use: Yes    Alcohol/week: 0.0 standard drinks  . Drug use: No  . Sexual activity: Yes  Other Topics Concern  . Not on file  Social History Narrative   Single, lives with male friend for the past 42 plus years.    Social Determinants of Health   Financial Resource Strain: Low Risk   . Difficulty of Paying Living Expenses: Not hard at all  Food Insecurity: No Food Insecurity  . Worried About  Charity fundraiser in the Last Year: Never true  .  Ran Out of Food in the Last Year: Never true  Transportation Needs: No Transportation Needs  . Lack of Transportation (Medical): No  . Lack of Transportation (Non-Medical): No  Physical Activity: Inactive  . Days of Exercise per Week: 0 days  . Minutes of Exercise per Session: 0 min  Stress: No Stress Concern Present  . Feeling of Stress : Not at all  Social Connections: Moderately Integrated  . Frequency of Communication with Friends and Family: Three times a week  . Frequency of Social Gatherings with Friends and Family: Three times a week  . Attends Religious Services: 1 to 4 times per year  . Active Member of Clubs or Organizations: No  . Attends Archivist Meetings: Never  . Marital Status: Living with partner  Intimate Partner Violence: Not At Risk  . Fear of Current or Ex-Partner: No  . Emotionally Abused: No  . Physically Abused: No  . Sexually Abused: No     Current Outpatient Medications:  .  acetaminophen (TYLENOL) 500 MG tablet, Take 1 tablet (500 mg total) by mouth every 6 (six) hours as needed., Disp: 30 tablet, Rfl: 0 .  aspirin 81 MG chewable tablet, Chew 81 mg by mouth daily., Disp: , Rfl:  .  ezetimibe (ZETIA) 10 MG tablet, Take 1 tablet (10 mg total) by mouth daily., Disp: 90 tablet, Rfl: 1 .  Ipratropium-Albuterol (COMBIVENT) 20-100 MCG/ACT AERS respimat, Inhale 1 puff into the lungs every 6 (six) hours., Disp: 4 g, Rfl: 1 .  lisinopril (ZESTRIL) 5 MG tablet, Take 1 tablet (5 mg total) by mouth daily. For bp and kidney protection, Disp: 90 tablet, Rfl: 1 .  meclizine (ANTIVERT) 25 MG tablet, TAKE 1 TABLET (25 MG TOTAL) BY MOUTH DAILY AS NEEDED FOR DIZZINESS., Disp: 30 tablet, Rfl: 0 .  metoprolol succinate (TOPROL-XL) 25 MG 24 hr tablet, Take 1 tablet (25 mg total) by mouth daily. Take with or immediately following a meal., Disp: 90 tablet, Rfl: 1 .  rosuvastatin (CRESTOR) 40 MG tablet, Take 1 tablet  (40 mg total) by mouth daily., Disp: 90 tablet, Rfl: 1  No Known Allergies   ROS  Constitutional: Negative for fever or weight change.  Respiratory: Negative for cough and shortness of breath.   Cardiovascular: Negative for chest pain or palpitations.  Gastrointestinal: Negative for abdominal pain, no bowel changes.  Musculoskeletal: Negative for gait problem or joint swelling.  Skin: Negative for rash.  Neurological: Negative for dizziness or headache.  No other specific complaints in a complete review of systems (except as listed in HPI above).    Objective  Vitals:   09/30/21 1031  BP: 132/82  Pulse: 87  Resp: 16  Temp: 98.1 F (36.7 C)  SpO2: 99%  Weight: 150 lb (68 kg)  Height: 5\' 6"  (1.676 m)    Body mass index is 24.21 kg/m.  Physical Exam  Constitutional: Patient appears well-developed and well-nourished. No distress.  HENT: Head: Normocephalic and atraumatic. Ears: B TMs ok, no erythema or effusion; Nose: Nose normal. Mouth/Throat: Oropharynx is clear and moist. No oropharyngeal exudate.  Eyes: Conjunctivae and EOM are normal. Pupils are equal, round, and reactive to light. No scleral icterus.  Neck: Normal range of motion. Neck supple. No JVD present. No thyromegaly present.  Cardiovascular: Normal rate, regular rhythm and normal heart sounds.  No murmur heard. No BLE edema. Pulmonary/Chest: Effort normal and breath sounds normal. No respiratory distress. Abdominal: Soft. Bowel sounds are normal, no distension. There is no  tenderness. no masses MALE GENITALIA: Normal descended testes bilaterally, no masses palpated, no hernias, no lesions, no discharge RECTAL: refused  Musculoskeletal: Normal range of motion, no joint effusions. No gross deformities Neurological: he is alert and oriented to person, place, and time. No cranial nerve deficit. Coordination, balance, strength, speech and gait are normal.  Skin: Skin is warm and dry. No rash noted. No erythema.   Psychiatric: Patient has a normal mood and affect. behavior is normal. Judgment and thought content normal.    Fall Risk: Fall Risk  09/30/2021 09/03/2021 03/03/2021 02/02/2021 04/23/2020  Falls in the past year? 0 0 0 0 0  Number falls in past yr: 0 0 0 0 0  Injury with Fall? 0 0 0 0 0  Risk for fall due to : No Fall Risks No Fall Risks - Impaired balance/gait -  Follow up Falls prevention discussed Falls prevention discussed - Falls prevention discussed -      Functional Status Survey: Is the patient deaf or have difficulty hearing?: No Does the patient have difficulty seeing, even when wearing glasses/contacts?: No Does the patient have difficulty concentrating, remembering, or making decisions?: No Does the patient have difficulty walking or climbing stairs?: No Does the patient have difficulty dressing or bathing?: No Does the patient have difficulty doing errands alone such as visiting a doctor's office or shopping?: No    Assessment & Plan  1. Well adult exam  - PSA - COMPLETE METABOLIC PANEL WITH GFR - Lipid panel - Hemoglobin A1c  2. Stage 3a chronic kidney disease ([place])   3. Hyperglycemia  - Hemoglobin A1c  4. Prostate cancer screening  - PSA  5. Lower urinary tract symptoms (LUTS)  - PSA  6. Pure hypercholesterolemia  - Lipid panel    -Prostate cancer screening and PSA options (with potential risks and benefits of testing vs not testing) were discussed along with recent recs/guidelines. -USPSTF grade A and B recommendations reviewed with patient; age-appropriate recommendations, preventive care, screening tests, etc discussed and encouraged; healthy living encouraged; see AVS for patient education given to patient -Discussed importance of 150 minutes of physical activity weekly, eat two servings of fish weekly, eat one serving of tree nuts ( cashews, pistachios, pecans, almonds.Marland Kitchen) every other day, eat 6 servings of fruit/vegetables daily and drink plenty  of water and avoid sweet beverages.

## 2021-09-30 ENCOUNTER — Encounter: Payer: Self-pay | Admitting: Family Medicine

## 2021-09-30 ENCOUNTER — Ambulatory Visit (INDEPENDENT_AMBULATORY_CARE_PROVIDER_SITE_OTHER): Payer: Medicare HMO | Admitting: Family Medicine

## 2021-09-30 VITALS — BP 132/82 | HR 87 | Temp 98.1°F | Resp 16 | Ht 66.0 in | Wt 150.0 lb

## 2021-09-30 DIAGNOSIS — Z0000 Encounter for general adult medical examination without abnormal findings: Secondary | ICD-10-CM | POA: Diagnosis not present

## 2021-09-30 DIAGNOSIS — R399 Unspecified symptoms and signs involving the genitourinary system: Secondary | ICD-10-CM

## 2021-09-30 DIAGNOSIS — E7800 Pure hypercholesterolemia, unspecified: Secondary | ICD-10-CM

## 2021-09-30 DIAGNOSIS — N1831 Chronic kidney disease, stage 3a: Secondary | ICD-10-CM | POA: Diagnosis not present

## 2021-09-30 DIAGNOSIS — Z125 Encounter for screening for malignant neoplasm of prostate: Secondary | ICD-10-CM | POA: Diagnosis not present

## 2021-09-30 DIAGNOSIS — R739 Hyperglycemia, unspecified: Secondary | ICD-10-CM | POA: Diagnosis not present

## 2021-10-01 LAB — LIPID PANEL
Cholesterol: 134 mg/dL (ref <200)
HDL: 41 mg/dL (ref >=40)
LDL Cholesterol (Calc): 68 mg/dL (calc)
Non-HDL Cholesterol (Calc): 93 mg/dL (calc) (ref <130)
Total CHOL/HDL Ratio: 3.3 (calc) (ref <5.0)
Triglycerides: 174 mg/dL — ABNORMAL HIGH (ref <150)

## 2021-10-01 LAB — COMPLETE METABOLIC PANEL WITH GFR
AG Ratio: 1.3 (calc) (ref 1.0–2.5)
ALT: 9 U/L (ref 9–46)
AST: 10 U/L (ref 10–35)
Albumin: 3.9 g/dL (ref 3.6–5.1)
Alkaline phosphatase (APISO): 201 U/L — ABNORMAL HIGH (ref 35–144)
BUN: 19 mg/dL (ref 7–25)
CO2: 27 mmol/L (ref 20–32)
Calcium: 9.7 mg/dL (ref 8.6–10.3)
Chloride: 104 mmol/L (ref 98–110)
Creat: 1.06 mg/dL (ref 0.70–1.28)
Globulin: 2.9 g/dL (calc) (ref 1.9–3.7)
Glucose, Bld: 95 mg/dL (ref 65–99)
Potassium: 4.4 mmol/L (ref 3.5–5.3)
Sodium: 140 mmol/L (ref 135–146)
Total Bilirubin: 0.3 mg/dL (ref 0.2–1.2)
Total Protein: 6.8 g/dL (ref 6.1–8.1)
eGFR: 71 mL/min/{1.73_m2} (ref >=60)

## 2021-10-01 LAB — HEMOGLOBIN A1C
Hgb A1c MFr Bld: 6.6 % of total Hgb — ABNORMAL HIGH (ref <5.7)
Mean Plasma Glucose: 143 mg/dL
eAG (mmol/L): 7.9 mmol/L

## 2021-10-01 LAB — PSA: PSA: 374.56 ng/mL — ABNORMAL HIGH (ref <=4.00)

## 2021-10-04 ENCOUNTER — Other Ambulatory Visit: Payer: Self-pay | Admitting: Family Medicine

## 2021-10-04 DIAGNOSIS — R972 Elevated prostate specific antigen [PSA]: Secondary | ICD-10-CM

## 2021-10-14 ENCOUNTER — Other Ambulatory Visit: Payer: Self-pay

## 2021-10-14 ENCOUNTER — Encounter: Payer: Self-pay | Admitting: Urology

## 2021-10-14 ENCOUNTER — Ambulatory Visit: Payer: Medicare HMO | Admitting: Urology

## 2021-10-14 VITALS — BP 150/79 | HR 87 | Ht 66.0 in | Wt 151.0 lb

## 2021-10-14 DIAGNOSIS — R972 Elevated prostate specific antigen [PSA]: Secondary | ICD-10-CM | POA: Diagnosis not present

## 2021-10-14 NOTE — H&P (View-Only) (Signed)
10/14/2021 2:26 PM   Eric [name] [birth year] [id number]  Referring provider: Steele Sizer, MD [address] [place],  [place] [address]  Chief Complaint  Patient presents with   Elevated PSA    HPI: Eric [name] is a 80 y.o. male referred for evaluation of an elevated PSA.  PSA drawn 09/30/2021 374.56 No prior PSA results for comparison Patient not aware if he has had a prior PSA; denies prior urologic evaluation or biopsy Mild lower urinary tract symptoms of decreased force and caliber of urinary stream and urinary hesitancy Denies dysuria or gross hematuria Denies flank, abdominal or pelvic pain   PMH: Past Medical History:  Diagnosis Date   Hyperlipidemia    Hypertension     Surgical History: History reviewed. No pertinent surgical history.  Home Medications:  Allergies as of 10/14/2021   No Known Allergies      Medication List        Accurate as of October 14, 2021 11:59 PM. If you have any questions, ask your nurse or doctor.          acetaminophen 500 MG tablet Commonly known as: TYLENOL Take 1 tablet (500 mg total) by mouth every 6 (six) hours as needed.   aspirin 81 MG chewable tablet Chew 81 mg by mouth daily.   ezetimibe 10 MG tablet Commonly known as: ZETIA Take 1 tablet (10 mg total) by mouth daily.   Ipratropium-Albuterol 20-100 MCG/ACT Aers respimat Commonly known as: COMBIVENT Inhale 1 puff into the lungs every 6 (six) hours.   lisinopril 5 MG tablet Commonly known as: ZESTRIL Take 1 tablet (5 mg total) by mouth daily. For bp and kidney protection   meclizine 25 MG tablet Commonly known as: ANTIVERT TAKE 1 TABLET (25 MG TOTAL) BY MOUTH DAILY AS NEEDED FOR DIZZINESS.   metoprolol succinate 25 MG 24 hr tablet Commonly known as: TOPROL-XL Take 1 tablet (25 mg total) by mouth daily. Take with or immediately following a meal.   rosuvastatin 40 MG tablet Commonly known as: CRESTOR Take 1 tablet (40 mg  total) by mouth daily.        Allergies: No Known Allergies  Family History: Family History  Problem Relation Age of Onset   Heart disease Mother    COPD Father     Social History:  reports that he has been smoking cigarettes. He has a 30.00 pack-year smoking history. He has never used smokeless tobacco. He reports current alcohol use. He reports that he does not use drugs.   Physical Exam: BP (!) 150/79    Pulse 87    Ht 5\' 6"  (1.676 m)    Wt 151 lb (68.5 kg)    BMI 24.37 kg/m   Constitutional:  Alert and oriented, No acute distress. HEENT: [place] AT, moist mucus membranes.  Trachea midline, no masses. Cardiovascular: No clubbing, cyanosis, or edema. Respiratory: Normal respiratory effort, no increased work of breathing. GI: Abdomen is soft, nontender, nondistended, no abdominal masses GU: Prostate 50 g, asymmetric, firm and irregular Skin: No rashes, bruises or suspicious lesions. Neurologic: Grossly intact, no focal deficits, moving all 4 extremities. Psychiatric: Normal mood and affect.   Assessment & Plan:    1.  Elevated PSA We discussed significant PSA elevation and DRE are suspicious for prostate cancer Recommend scheduling TRUS/prostate biopsy.  He had significant discomfort in anal canal on DRE and do not think he would tolerate an office biopsy.  We will schedule in same-day surgery under  sedation We discussed potential complications including infection/sepsis and bleeding All questions were answered and he desires to proceed    Abbie Sons, MD  [facility] [address], [place], [place] [address] [phone]

## 2021-10-14 NOTE — Progress Notes (Signed)
10/14/2021 2:26 PM   Eric [name] June 29, 1941 [id number]  Referring provider: Steele Sizer, MD [address] [place],  [place] [address]  Chief Complaint  Patient presents with   Elevated PSA    HPI: Eric [name] is a 80 y.o. male referred for evaluation of an elevated PSA.  PSA drawn 09/30/2021 374.56 No prior PSA results for comparison Patient not aware if he has had a prior PSA; denies prior urologic evaluation or biopsy Mild lower urinary tract symptoms of decreased force and caliber of urinary stream and urinary hesitancy Denies dysuria or gross hematuria Denies flank, abdominal or pelvic pain   PMH: Past Medical History:  Diagnosis Date   Hyperlipidemia    Hypertension     Surgical History: History reviewed. No pertinent surgical history.  Home Medications:  Allergies as of 10/14/2021   No Known Allergies      Medication List        Accurate as of October 14, 2021 11:59 PM. If you have any questions, ask your nurse or doctor.          acetaminophen 500 MG tablet Commonly known as: TYLENOL Take 1 tablet (500 mg total) by mouth every 6 (six) hours as needed.   aspirin 81 MG chewable tablet Chew 81 mg by mouth daily.   ezetimibe 10 MG tablet Commonly known as: ZETIA Take 1 tablet (10 mg total) by mouth daily.   Ipratropium-Albuterol 20-100 MCG/ACT Aers respimat Commonly known as: COMBIVENT Inhale 1 puff into the lungs every 6 (six) hours.   lisinopril 5 MG tablet Commonly known as: ZESTRIL Take 1 tablet (5 mg total) by mouth daily. For bp and kidney protection   meclizine 25 MG tablet Commonly known as: ANTIVERT TAKE 1 TABLET (25 MG TOTAL) BY MOUTH DAILY AS NEEDED FOR DIZZINESS.   metoprolol succinate 25 MG 24 hr tablet Commonly known as: TOPROL-XL Take 1 tablet (25 mg total) by mouth daily. Take with or immediately following a meal.   rosuvastatin 40 MG tablet Commonly known as: CRESTOR Take 1 tablet (40 mg  total) by mouth daily.        Allergies: No Known Allergies  Family History: Family History  Problem Relation Age of Onset   Heart disease Mother    COPD Father     Social History:  reports that he has been smoking cigarettes. He has a 30.00 pack-year smoking history. He has never used smokeless tobacco. He reports current alcohol use. He reports that he does not use drugs.   Physical Exam: BP (!) 150/79    Pulse 87    Ht 5\' 6"  (1.676 m)    Wt 151 lb (68.5 kg)    BMI 24.37 kg/m   Constitutional:  Alert and oriented, No acute distress. HEENT: [place] AT, moist mucus membranes.  Trachea midline, no masses. Cardiovascular: No clubbing, cyanosis, or edema. Respiratory: Normal respiratory effort, no increased work of breathing. GI: Abdomen is soft, nontender, nondistended, no abdominal masses GU: Prostate 50 g, asymmetric, firm and irregular Skin: No rashes, bruises or suspicious lesions. Neurologic: Grossly intact, no focal deficits, moving all 4 extremities. Psychiatric: Normal mood and affect.   Assessment & Plan:    1.  Elevated PSA We discussed significant PSA elevation and DRE are suspicious for prostate cancer Recommend scheduling TRUS/prostate biopsy.  He had significant discomfort in anal canal on DRE and do not think he would tolerate an office biopsy.  We will schedule in same-day surgery under  sedation We discussed potential complications including infection/sepsis and bleeding All questions were answered and he desires to proceed    Abbie Sons, MD  [facility] [address], [place], [place] [address] [phone]

## 2021-10-18 ENCOUNTER — Other Ambulatory Visit: Payer: Self-pay | Admitting: Urology

## 2021-10-18 ENCOUNTER — Encounter: Payer: Self-pay | Admitting: Urology

## 2021-10-18 DIAGNOSIS — R972 Elevated prostate specific antigen [PSA]: Secondary | ICD-10-CM

## 2021-10-18 NOTE — Progress Notes (Signed)
Surgical Physician [facility]   * Scheduling expectation : ASAP  *Length of Case: 30 min  *Clearance needed: no  *Anticoagulation Instructions: N/A  *Aspirin Instructions: N/A  *Post-op visit Date/Instructions:  1-2 week with pathology review  *Diagnosis: Elevated PSA  *Procedure:  N/A  Prostate Biopsy (33545) TRUS (62563) [place] SLHTD(42876) Nerve block (81157)   Additional orders: N/A  -Admit type: OUTpatient  -Anesthesia: MAC  -VTE Prophylaxis Standing Order SCDs       Other:   -Standing Lab Orders Per Anesthesia    Lab other: UA&Urine Culture  -Standing Test orders EKG/Chest x-ray per Anesthesia       Test other:   - Medications:  Ceftriaxone(Rocephin) 1gm IV  -Other orders:  Fleets enema AM

## 2021-10-19 ENCOUNTER — Other Ambulatory Visit: Payer: Self-pay | Admitting: Urology

## 2021-10-19 DIAGNOSIS — R972 Elevated prostate specific antigen [PSA]: Secondary | ICD-10-CM

## 2021-10-19 NOTE — Progress Notes (Signed)
[facility] Posting Form   Surgery Date/Time: Date: 10/26/2021  Surgeon: Dr. John Giovanni, MD  Surgery Location: Day Surgery  Inpt ( No  )   Outpt (Yes)   Obs ( No  )   Diagnosis: Elevated PSA R97.20  -CPT: [id number]  Surgery: Transrectal Ultrasound guided Prostate Biopsy with nerve block  Stop Anticoagulations: N/A  Cardiac/Medical/Pulmonary Clearance needed: no   *Orders entered into EPIC  Date: 10/19/21   *Case booked in [place]  Date: 10/19/21  *Notified pt of Surgery: Date: 10/19/21  PRE-OP UA & CX: Yes, Will obtain on 10/20/2021  *Placed into Prior Authorization Work Fabio Bering Date: 10/19/21   Assistant/laser/rep:No  Needs Fleets Enema Prior to Procedure

## 2021-10-20 ENCOUNTER — Other Ambulatory Visit
Admission: RE | Admit: 2021-10-20 | Discharge: 2021-10-20 | Disposition: A | Discharge status: Home / Self Care | Payer: Medicare HMO | Attending: Urology | Admitting: Urology

## 2021-10-20 DIAGNOSIS — R972 Elevated prostate specific antigen [PSA]: Secondary | ICD-10-CM | POA: Insufficient documentation

## 2021-10-21 ENCOUNTER — Encounter
Admission: RE | Admit: 2021-10-21 | Discharge: 2021-10-21 | Disposition: A | Discharge status: Home / Self Care | Payer: Medicare HMO | Source: Ambulatory Visit | Attending: Urology | Admitting: Urology

## 2021-10-21 ENCOUNTER — Other Ambulatory Visit: Payer: Self-pay

## 2021-10-21 HISTORY — DX: Dyspnea, unspecified: R06.00

## 2021-10-21 HISTORY — DX: Chronic obstructive pulmonary disease, unspecified: J44.9

## 2021-10-21 HISTORY — DX: Gastro-esophageal reflux disease without esophagitis: K21.9

## 2021-10-21 NOTE — Patient Instructions (Signed)
Your procedure is scheduled on:10-26-21 Tuesday Report to the Registration Desk on the 1st floor of the [facility].Then proceed to the 2nd floor Surgery Desk in the [facility] To find out your arrival time, please call [phone] between 1PM - 3PM on:10-22-21 Friday  REMEMBER: Instructions that are not followed completely may result in serious medical risk, up to and including death; or upon the discretion of your surgeon and anesthesiologist your surgery may need to be rescheduled.  Do not eat food OR drink any liquids after midnight the night before surgery.  No gum chewing, lozengers or hard candies  TAKE THESE MEDICATIONS THE MORNING OF SURGERY WITH A SIP OF WATER: -metoprolol succinate (TOPROL-XL) -ezetimibe (ZETIA)  -rosuvastatin (CRESTOR)  Call Dr Dene Gentry office today (10-21-21) to find out when you need to stop your aspirin 81 MG   One week prior to surgery: Stop Anti-inflammatories (NSAIDS) such as Advil, Aleve, Ibuprofen, Motrin, Naproxen, Naprosyn and Aspirin based products such as Excedrin, Goodys Powder, BC Powder.You may however, continue to take Tylenol if needed for pain up until the day of surgery.  Stop ANY OVER THE COUNTER supplements/vitamins NOW (10-21-21) until after surgery   No Alcohol for 24 hours before or after surgery.  No Smoking including e-cigarettes for 24 hours prior to surgery.  No chewable tobacco products for at least 6 hours prior to surgery.  No nicotine patches on the day of surgery.  Do not use any "recreational" drugs for at least a week prior to your surgery.  Please be advised that the combination of cocaine and anesthesia may have negative outcomes, up to and including death. If you test positive for cocaine, your surgery will be cancelled.  On the morning of surgery brush your teeth with toothpaste and water, you may rinse your mouth with mouthwash if you wish. Do not swallow any toothpaste or mouthwash.  Do not wear jewelry,  make-up, hairpins, clips or nail polish.  Do not wear lotions, powders, or perfumes.   Do not shave body from the neck down 48 hours prior to surgery just in case you cut yourself which could leave a site for infection.  Also, freshly shaved skin may become irritated if using the CHG soap.  Contact lenses, hearing aids and dentures may not be worn into surgery.  Do not bring valuables to the hospital. [facility] is not responsible for any missing/lost belongings or valuables.  Fleets enema as directed. Do Fleet Enema at home the morning of surgery 1 hour prior to arrival time to hospital  Notify your doctor if there is any change in your medical condition (cold, fever, infection).  Wear comfortable clothing (specific to your surgery type) to the hospital.  After surgery, you can help prevent lung complications by doing breathing exercises.  Take deep breaths and cough every 1-2 hours. Your doctor may order a device called an Incentive Spirometer to help you take deep breaths. When coughing or sneezing, hold a pillow firmly against your incision with both hands. This is called splinting. Doing this helps protect your incision. It also decreases belly discomfort.  If you are being admitted to the hospital overnight, leave your suitcase in the car. After surgery it may be brought to your room.  If you are being discharged the day of surgery, you will not be allowed to drive home. You will need a responsible adult (18 years or older) to drive you home and stay with you that night.   If you are  taking public transportation, you will need to have a responsible adult (18 years or older) with you. Please confirm with your physician that it is acceptable to use public transportation.   Please call the [facility] Dept. at [phone] if you have any questions about these instructions.  Surgery Visitation Policy:  Patients undergoing a surgery or procedure may have one family  member or support person with them as long as that person is not COVID-19 positive or experiencing its symptoms.  That person may remain in the waiting area during the procedure and may rotate out with other people.  Inpatient Visitation:    Visiting hours are 7 a.m. to 8 p.m. Up to two visitors ages 16+ are allowed at one time in a patient room. The visitors may rotate out with other people during the day. Visitors must check out when they leave, or other visitors will not be allowed. One designated support person may remain overnight. The visitor must pass COVID-19 screenings, use hand sanitizer when entering and exiting the patients room and wear a mask at all times, including in the patients room. Patients must also wear a mask when staff or their visitor are in the room. Masking is required regardless of vaccination status.    Sodium Phosphate Monobasic; Sodium Phosphate Dibasic enema What is this medication? SODIUM PHOSPATE SALT (SOE dee um  FOS fate  sawlt) is a saline laxative. It is used to treat constipation or to clean the bowel before a colonoscopy. This medicine may be used for other purposes; ask your health care provider or pharmacist if you have questions.  COMMON BRAND NAME(S): Fleet, Ready To Use Saline  What should I tell my care team before I take this medication? They need to know if you have any of these conditions: abnormal blood levels of electrolytes like sodium, phosphate, potassium or calcium bowel problems like colitis, constipation, and obstruction change in bowel habits lasting more than 2 weeks chest pain dehydration heart failure kidney disease on low salt or sodium diet stomach pain, nausea, vomiting an unusual or allergic reaction to sodium phosphate, other medicines, foods, dyes, or preservatives pregnant or trying to get pregnant breast-feeding How should I use this medication? This medicine is for rectal use only. Do not take by mouth. Follow  the directions on the prescription label. Wash your hands before and after use. Remove tip from enema bottle. Gently insert enema tip into the rectum. Squeeze bottle until almost all of the medicine is inside the rectum. Remove enema tip from the rectum and stay in position until the urge to evacuate is strong. Do not take doses that are larger than those recommended on the product label or otherwise directed by your healthcare professional. Do not take more than one dose in 24 hours. Talk to your pediatrician regarding the use of this medicine in children. While this drug may be prescribed for children as young as 43 years old for selected conditions, precautions do apply. Overdosage: If you think you have taken too much of this medicine contact a poison control center or emergency room at once. NOTE: This medicine is only for you. Do not share this medicine with others. What if I miss a dose? This does not apply; this medicine is not for regular use. What may interact with this medication? aspirin certain medicines used to treat high blood pressure, like captopril, enalapril, lisinopril, or candesartan, losartan, valsartan diuretics NSAIDS, medicines for pain and inflammation, like ibuprofen or naproxen This list  may not describe all possible interactions. Give your health care provider a list of all the medicines, herbs, non-prescription drugs, or dietary supplements you use. Also tell them if you smoke, drink alcohol, or use illegal drugs. Some items may interact with your medicine. What should I watch for while using this medication? Do not use with any other laxatives unless your doctor tells you to. Drink fluids as directed to prevent dehydration. See your doctor right away if you do not have a bowel movement after using this medicine. What side effects may I notice from receiving this medication? Side effects that you should report to your doctor or health care professional as soon as  possible: allergic reactions like skin rash, itching or hives, swelling of the face, lips, or tongue irregular heart beat rectal bleeding seizures Side effects that usually do not require medical attention (report to your doctor or health care professional if they continue or are bothersome): bloating dizziness headache nausea and vomiting stomach pain This list may not describe all possible side effects. Call your doctor for medical advice about side effects. You may report side effects to FDA at 1-800-FDA-1088. Where should I keep my medication? Keep out of the reach of children. Store at room temperature between 15 and 30 degrees C (59 and 86 degrees F). Throw away any unused medicine after the expiration date. NOTE: This sheet is a summary. It may not cover all possible information. If you have questions about this medicine, talk to your doctor, pharmacist, or health care provider.  2022 Elsevier/Gold Standard (2012-11-01 00:00:00)

## 2021-10-22 ENCOUNTER — Other Ambulatory Visit
Admission: RE | Admit: 2021-10-22 | Discharge: 2021-10-22 | Disposition: A | Discharge status: Home / Self Care | Payer: Medicare HMO | Source: Ambulatory Visit | Attending: Urology | Admitting: Urology

## 2021-10-22 ENCOUNTER — Encounter
Admission: RE | Admit: 2021-10-22 | Discharge: 2021-10-22 | Disposition: A | Discharge status: Home / Self Care | Payer: Medicare HMO | Source: Ambulatory Visit | Attending: Urology | Admitting: Urology

## 2021-10-22 DIAGNOSIS — H25813 Combined forms of age-related cataract, bilateral: Secondary | ICD-10-CM | POA: Diagnosis not present

## 2021-10-22 DIAGNOSIS — I10 Essential (primary) hypertension: Secondary | ICD-10-CM | POA: Insufficient documentation

## 2021-10-22 DIAGNOSIS — Z01818 Encounter for other preprocedural examination: Secondary | ICD-10-CM | POA: Insufficient documentation

## 2021-10-22 DIAGNOSIS — R972 Elevated prostate specific antigen [PSA]: Secondary | ICD-10-CM | POA: Insufficient documentation

## 2021-10-22 DIAGNOSIS — H43812 Vitreous degeneration, left eye: Secondary | ICD-10-CM | POA: Diagnosis not present

## 2021-10-22 DIAGNOSIS — Z1389 Encounter for screening for other disorder: Secondary | ICD-10-CM | POA: Insufficient documentation

## 2021-10-22 DIAGNOSIS — H5461 Unqualified visual loss, right eye, normal vision left eye: Secondary | ICD-10-CM | POA: Diagnosis not present

## 2021-10-22 DIAGNOSIS — H524 Presbyopia: Secondary | ICD-10-CM | POA: Diagnosis not present

## 2021-10-23 LAB — URINE CULTURE

## 2021-10-26 ENCOUNTER — Ambulatory Visit
Admission: RE | Admit: 2021-10-26 | Discharge: 2021-10-26 | Disposition: A | Discharge status: Home / Self Care | Payer: Medicare HMO | Attending: Urology | Admitting: Urology

## 2021-10-26 ENCOUNTER — Ambulatory Visit: Payer: Medicare HMO | Admitting: Certified Registered"

## 2021-10-26 ENCOUNTER — Encounter: Payer: Self-pay | Admitting: Urology

## 2021-10-26 ENCOUNTER — Ambulatory Visit
Admission: RE | Admit: 2021-10-26 | Discharge: 2021-10-26 | Disposition: A | Discharge status: Home / Self Care | Payer: Medicare HMO | Source: Ambulatory Visit | Attending: Urology | Admitting: Urology

## 2021-10-26 ENCOUNTER — Other Ambulatory Visit: Payer: Self-pay

## 2021-10-26 ENCOUNTER — Encounter
Admission: RE | Disposition: A | Discharge status: Home / Self Care | Payer: Self-pay | Source: Home / Self Care | Attending: Urology

## 2021-10-26 DIAGNOSIS — E785 Hyperlipidemia, unspecified: Secondary | ICD-10-CM | POA: Insufficient documentation

## 2021-10-26 DIAGNOSIS — F17210 Nicotine dependence, cigarettes, uncomplicated: Secondary | ICD-10-CM | POA: Diagnosis not present

## 2021-10-26 DIAGNOSIS — F17200 Nicotine dependence, unspecified, uncomplicated: Secondary | ICD-10-CM | POA: Insufficient documentation

## 2021-10-26 DIAGNOSIS — R972 Elevated prostate specific antigen [PSA]: Secondary | ICD-10-CM | POA: Diagnosis not present

## 2021-10-26 DIAGNOSIS — J449 Chronic obstructive pulmonary disease, unspecified: Secondary | ICD-10-CM | POA: Diagnosis not present

## 2021-10-26 DIAGNOSIS — C61 Malignant neoplasm of prostate: Secondary | ICD-10-CM | POA: Insufficient documentation

## 2021-10-26 DIAGNOSIS — K219 Gastro-esophageal reflux disease without esophagitis: Secondary | ICD-10-CM | POA: Diagnosis not present

## 2021-10-26 DIAGNOSIS — I10 Essential (primary) hypertension: Secondary | ICD-10-CM | POA: Insufficient documentation

## 2021-10-26 HISTORY — PX: PROSTATE BIOPSY: SHX241

## 2021-10-26 HISTORY — PX: TRANSRECTAL ULTRASOUND: SHX5146

## 2021-10-26 SURGERY — ULTRASOUND, RECTAL APPROACH
Anesthesia: General

## 2021-10-26 MED ORDER — FENTANYL CITRATE (PF) 100 MCG/2ML IJ SOLN
INTRAMUSCULAR | Status: DC | PRN
  Administered 2021-10-26: 25 ug via INTRAVENOUS
  Administered 2021-10-26: 50 ug via INTRAVENOUS

## 2021-10-26 MED ORDER — ORAL CARE MOUTH RINSE
15.0000 mL | Freq: Once | OROMUCOSAL | Status: AC
  Administered 2021-10-26: 15 mL via OROMUCOSAL

## 2021-10-26 MED ORDER — PROPOFOL 500 MG/50ML IV EMUL
INTRAVENOUS | Status: DC | PRN
  Administered 2021-10-26: 150 ug/kg/min via INTRAVENOUS

## 2021-10-26 MED ORDER — ONDANSETRON HCL 4 MG/2ML IJ SOLN
INTRAMUSCULAR | Status: DC | PRN
  Administered 2021-10-26: 4 mg via INTRAVENOUS

## 2021-10-26 MED ORDER — SODIUM CHLORIDE 0.9 % IV SOLN
1.0000 g | INTRAVENOUS | Status: AC
  Administered 2021-10-26: 2 g via INTRAVENOUS
  Filled 2021-10-26: qty 1

## 2021-10-26 MED ORDER — EPHEDRINE SULFATE 50 MG/ML IJ SOLN
INTRAMUSCULAR | Status: DC | PRN
  Administered 2021-10-26: 10 mg via INTRAVENOUS

## 2021-10-26 MED ORDER — FLEET ENEMA 7-19 GM/118ML RE ENEM: 1.0000 | ENEMA | Freq: Once | RECTAL | Status: DC

## 2021-10-26 MED ORDER — DEXMEDETOMIDINE (PRECEDEX) IN NS 20 MCG/5ML (4 MCG/ML) IV SYRINGE
PREFILLED_SYRINGE | INTRAVENOUS | Status: DC | PRN
  Administered 2021-10-26: 8 ug via INTRAVENOUS

## 2021-10-26 MED ORDER — FENTANYL CITRATE (PF) 100 MCG/2ML IJ SOLN
INTRAMUSCULAR | Status: AC
  Filled 2021-10-26: qty 2

## 2021-10-26 MED ORDER — CHLORHEXIDINE GLUCONATE 0.12 % MT SOLN
OROMUCOSAL | Status: AC
  Filled 2021-10-26: qty 15

## 2021-10-26 MED ORDER — LACTATED RINGERS IV SOLN: INTRAVENOUS | Status: DC

## 2021-10-26 MED ORDER — PROPOFOL 10 MG/ML IV BOLUS
INTRAVENOUS | Status: AC
  Filled 2021-10-26: qty 20

## 2021-10-26 MED ORDER — SULFAMETHOXAZOLE-TRIMETHOPRIM 800-160 MG PO TABS
1.0000 | ORAL_TABLET | Freq: Two times a day (BID) | ORAL | 0 refills | Status: AC
Start: 2021-10-26 — End: 2021-11-02

## 2021-10-26 MED ORDER — PHENYLEPHRINE 40 MCG/ML (10ML) SYRINGE FOR IV PUSH (FOR BLOOD PRESSURE SUPPORT)
PREFILLED_SYRINGE | INTRAVENOUS | Status: DC | PRN
  Administered 2021-10-26: 80 ug via INTRAVENOUS

## 2021-10-26 MED ORDER — ACETAMINOPHEN 10 MG/ML IV SOLN
INTRAVENOUS | Status: AC
  Filled 2021-10-26: qty 100

## 2021-10-26 MED ORDER — FAMOTIDINE 20 MG PO TABS: 20.0000 mg | ORAL_TABLET | Freq: Once | ORAL | Status: AC

## 2021-10-26 MED ORDER — FAMOTIDINE 20 MG PO TABS
ORAL_TABLET | ORAL | Status: AC
  Administered 2021-10-26: 20 mg via ORAL
  Filled 2021-10-26: qty 1

## 2021-10-26 MED ORDER — FENTANYL CITRATE (PF) 100 MCG/2ML IJ SOLN: 25.0000 ug | INTRAMUSCULAR | Status: DC | PRN

## 2021-10-26 MED ORDER — CHLORHEXIDINE GLUCONATE 0.12 % MT SOLN: 15.0000 mL | Freq: Once | OROMUCOSAL | Status: AC

## 2021-10-26 SURGICAL SUPPLY — 8 items
COVER MAYO STAND REUSABLE (DRAPES) ×2 IMPLANT
GAUZE SPONGE 4X4 12PLY STRL (GAUZE/BANDAGES/DRESSINGS) ×2 IMPLANT
GLOVE SURG UNDER POLY LF SZ7.5 (GLOVE) ×2 IMPLANT
INST BIOPSY MAXCORE 18GX25 (NEEDLE) ×2 IMPLANT
KIT TURNOVER CYSTO (KITS) ×2 IMPLANT
MANIFOLD NEPTUNE II (INSTRUMENTS) ×2 IMPLANT
SURGILUBE 2OZ TUBE FLIPTOP (MISCELLANEOUS) ×2 IMPLANT
WATER STERILE IRR 500ML POUR (IV SOLUTION) ×2 IMPLANT

## 2021-10-26 NOTE — Anesthesia Procedure Notes (Signed)
Procedure Name: MAC Date/Time: 10/26/2021 7:52 AM Performed by: Biagio Borg, CRNA Pre-anesthesia Checklist: Patient identified, Emergency Drugs available, Suction available, Patient being monitored and Timeout performed Patient Re-evaluated:Patient Re-evaluated prior to induction Oxygen Delivery Method: Nasal cannula Induction Type: IV induction Placement Confirmation: positive ETCO2 and CO2 detector

## 2021-10-26 NOTE — Discharge Instructions (Addendum)
Prostate biopsy discharge instructions  Blood from the rectum will be common x24 hours You may see intermittent blood in the urine for 2-3 weeks Contact the office for fever >101 degrees or excessive bleeding from the rectum or urinary tract You will be contacted with the pathology results and follow-up will be scheduled at that [place]   The drugs that you were given will stay in your system until tomorrow so for the next 24 hours you should not:  Drive an automobile Make any legal decisions Drink any alcoholic beverage   You may resume regular meals tomorrow.  Today it is better to start with liquids and gradually work up to solid foods.  You may eat anything you prefer, but it is better to start with liquids, then soup and crackers, and gradually work up to solid foods.   Please notify your doctor immediately if you have any unusual bleeding, trouble breathing, redness and pain at the surgery site, drainage, fever, or pain not relieved by medication.    Additional Instructions:        Please contact your physician with any problems or Same Day Surgery at [phone], Monday through Friday 6 am to 4 pm, or [place] at [facility] number at [phone].

## 2021-10-26 NOTE — Transfer of Care (Signed)
Immediate Anesthesia Transfer of Care Note  Patient: Eric [name]  Procedure(s) Performed: TRANSRECTAL ULTRASOUND PROSTATE BIOPSY  Patient Location: PACU  Anesthesia Type:General  Level of Consciousness: awake and drowsy  Airway & Oxygen Therapy: Patient Spontanous Breathing  Post-op Assessment: Report given to RN and Post -op Vital signs reviewed and stable  Post vital signs: Reviewed and stable  Last Vitals:  Vitals Value Taken Time  BP 109/67 10/26/21 0811  Temp    Pulse 81 10/26/21 0812  Resp 19 10/26/21 0812  SpO2 99 % 10/26/21 0812  Vitals shown include unvalidated device data.  Last Pain:  Vitals:   10/26/21 0630  TempSrc: Oral  PainSc: 0-No pain         Complications: No notable events documented.

## 2021-10-26 NOTE — Op Note (Signed)
Preoperative diagnosis:  Elevated PSA Abnormal digital rectal exam  Postoperative diagnosis:  Same  Procedure: Transrectal ultrasound prostate Transrectal prostate biopsies  Surgeon: Abbie Sons, MD  Anesthesia: MAC  Complications: None  Intraoperative findings: Enlarged transition zone.  Prostate volume.  Similar vesicles normal in appearance  EBL: Minimal  Specimens: Prostate biopsies x6  Indication: Eric [name] is a 81 y.o. with a PSA of 374 and a diffusely firm and irregular prostate on digital rectal exam.  After reviewing the management options for treatment, he elected to proceed with the above surgical procedure(s). We have discussed the potential benefits and risks of the procedure, side effects of the proposed treatment, the likelihood of the patient achieving the goals of the procedure, and any potential problems that might occur during the procedure or recuperation. Informed consent has been obtained.  Description of procedure:  The patient was taken to the operating room and not transferred to the operating table.  Sedation was obtained by anesthesia and he was placed in the left lateral decubitus position on the stretcher.  Preoperative antibiotics were administered. A preoperative time-out was performed.   Digital rectal was performed which was remarkable for a firm and irregular prostate.  A transrectal ultrasound probe was lubricated and passed per rectum.  Imaging was performed with findings as described above.  Standard sextant biopsies were then performed.  No significant bleeding was noted.  He was transported to the PACU in stable condition.  Plan: He will be contacted with the pathology results and postop follow-up will be scheduled at that time   Abbie Sons, M.D.

## 2021-10-26 NOTE — Anesthesia Preprocedure Evaluation (Addendum)
Anesthesia Evaluation  Patient identified by MRN, date of birth, ID band Patient awake    Reviewed: Allergy & Precautions, H&P , NPO status , Patient's Chart, lab work & pertinent test results, reviewed documented beta blocker date and time   History of Anesthesia Complications Negative for: history of anesthetic complications  Airway Mallampati: III  TM Distance: >3 FB Neck ROM: full    Dental  (+) Dental Advidsory Given, Edentulous Upper, Edentulous Lower   Pulmonary shortness of breath and with exertion, COPD,  COPD inhaler, neg recent URI, Current Smoker and Patient abstained from smoking.,    Pulmonary exam normal breath sounds clear to auscultation       Cardiovascular Exercise Tolerance: Good hypertension, (-) angina+ CAD  (-) Past MI and (-) Cardiac Stents Normal cardiovascular exam(-) dysrhythmias (-) Valvular Problems/Murmurs Rhythm:regular Rate:Normal     Neuro/Psych negative neurological ROS  negative psych ROS   GI/Hepatic Neg liver ROS, GERD  ,  Endo/Other  negative endocrine ROS  Renal/GU CRFRenal disease  negative genitourinary   Musculoskeletal   Abdominal   Peds  Hematology negative hematology ROS (+)   Anesthesia Other Findings Past Medical History: No date: COPD (chronic obstructive pulmonary disease) (HCC) No date: Dyspnea     Comment:  with exertion No date: GERD (gastroesophageal reflux disease)     Comment:  occ No date: Hyperlipidemia No date: Hypertension   Reproductive/Obstetrics negative OB ROS                             Anesthesia Physical Anesthesia Plan  ASA: 3  Anesthesia Plan: General   Post-op Pain Management:    Induction: Intravenous  PONV Risk Score and Plan: 1 and Propofol infusion and TIVA  Airway Management Planned: Natural Airway and Simple Face Mask  Additional Equipment:   Intra-op Plan:   Post-operative Plan: Extubation in  OR  Informed Consent: I have reviewed the patients History and Physical, chart, labs and discussed the procedure including the risks, benefits and alternatives for the proposed anesthesia with the patient or authorized representative who has indicated his/her understanding and acceptance.     Dental Advisory Given  Plan Discussed with: Anesthesiologist, CRNA and Surgeon  Anesthesia Plan Comments:        Anesthesia Quick Evaluation

## 2021-10-26 NOTE — Interval H&P Note (Signed)
History and Physical Interval Note:  CV:RRR Lungs:clear  10/26/2021 7:18 AM  Eric [name]  has presented today for surgery, with the diagnosis of Elevated PSA.  The various methods of treatment have been discussed with the patient and family. After consideration of risks, benefits and other options for treatment, the patient has consented to  Procedure(s): TRANSRECTAL ULTRASOUND (N/A) PROSTATE BIOPSY (N/A) as a surgical intervention.  The patient's history has been reviewed, patient examined, no change in status, stable for surgery.  I have reviewed the patient's chart and labs.  Questions were answered to the patient's satisfaction.     [place]

## 2021-10-27 LAB — SURGICAL PATHOLOGY

## 2021-11-01 NOTE — Anesthesia Postprocedure Evaluation (Signed)
Anesthesia Post Note  Patient: Eric [name]  Procedure(s) Performed: TRANSRECTAL ULTRASOUND PROSTATE BIOPSY  Patient location during evaluation: PACU Anesthesia Type: General Level of consciousness: awake and alert Pain management: pain level controlled Vital Signs Assessment: post-procedure vital signs reviewed and stable Respiratory status: spontaneous breathing, nonlabored ventilation, respiratory function stable and patient connected to nasal cannula oxygen Cardiovascular status: blood pressure returned to baseline and stable Postop Assessment: no apparent nausea or vomiting Anesthetic complications: no   No notable events documented.   Last Vitals:  Vitals:   10/26/21 0830 10/26/21 0840  BP: 116/62 (!) 117/96  Pulse: 81 81  Resp: 18 14  Temp: (!) 36.3 C 36.6 C  SpO2: 94% 95%    Last Pain:  Vitals:   10/27/21 0832  TempSrc:   PainSc: 0-No pain                 Martha Clan

## 2021-11-10 ENCOUNTER — Ambulatory Visit: Payer: Medicare HMO | Admitting: Urology

## 2021-11-10 ENCOUNTER — Encounter: Payer: Self-pay | Admitting: Urology

## 2021-11-10 ENCOUNTER — Other Ambulatory Visit: Payer: Self-pay

## 2021-11-10 VITALS — BP 143/74 | HR 97 | Ht 66.0 in | Wt 151.0 lb

## 2021-11-10 DIAGNOSIS — C61 Malignant neoplasm of prostate: Secondary | ICD-10-CM

## 2021-11-10 NOTE — Progress Notes (Signed)
11/10/2021 9:16 AM   Eric [name] 09/12/1941 [id number]  Referring provider: Steele Sizer, MD [address] [name],  [name]  Chief Complaint  Patient presents with   Follow-up    HPI: 81 y.o. male presents for postbiopsy follow-up.  Prostate biopsy under sedation 10/26/2021 PSA 374 with diffusely firm and irregular prostate on DRE No postbiopsy complaints; sextant biopsy performed Pathology: 6/6 cores positive for high-grade prostate cancer  (Gleason 4+4, 5+4, 4+5, 5+3) with percent of tissue involvement ranging 75-100%    PMH: Past Medical History:  Diagnosis Date   COPD (chronic obstructive pulmonary disease) (HCC)    Dyspnea    with exertion   GERD (gastroesophageal reflux disease)    occ   Hyperlipidemia    Hypertension     Surgical History: Past Surgical History:  Procedure Laterality Date   COLONOSCOPY     HEMORROIDECTOMY     PROSTATE BIOPSY N/A 10/26/2021   Procedure: PROSTATE BIOPSY;  Surgeon: Abbie Sons, MD;  Location: [facility] ORS;  Service: Urology;  Laterality: N/A;   TRANSRECTAL ULTRASOUND N/A 10/26/2021   Procedure: TRANSRECTAL ULTRASOUND;  Surgeon: Abbie Sons, MD;  Location: [facility] ORS;  Service: Urology;  Laterality: N/A;    Home Medications:  Allergies as of 11/10/2021   No Known Allergies      Medication List        Accurate as of November 10, 2021  9:16 AM. If you have any questions, ask your nurse or doctor.          acetaminophen 500 MG tablet Commonly known as: TYLENOL Take 1 tablet (500 mg total) by mouth every 6 (six) hours as needed.   ezetimibe 10 MG tablet Commonly known as: ZETIA Take 1 tablet (10 mg total) by mouth daily. What changed: when to take this   Ipratropium-Albuterol 20-100 MCG/ACT Aers respimat Commonly known as: COMBIVENT Inhale 1 puff into the lungs every 6 (six) hours.   lisinopril 5 MG tablet Commonly known as: ZESTRIL Take 1 tablet (5 mg total) by mouth daily. For  bp and kidney protection What changed: when to take this   meclizine 25 MG tablet Commonly known as: ANTIVERT TAKE 1 TABLET (25 MG TOTAL) BY MOUTH DAILY AS NEEDED FOR DIZZINESS.   metoprolol succinate 25 MG 24 hr tablet Commonly known as: TOPROL-XL Take 1 tablet (25 mg total) by mouth daily. Take with or immediately following a meal. What changed: when to take this   ROLAIDS PO Take 1 tablet by mouth as needed.   rosuvastatin 40 MG tablet Commonly known as: CRESTOR Take 1 tablet (40 mg total) by mouth daily. What changed: when to take this        Allergies: No Known Allergies  Family History: Family History  Problem Relation Age of Onset   Heart disease Mother    COPD Father     Social History:  reports that he has been smoking cigarettes. He has a 30.00 pack-year smoking history. He has never used smokeless tobacco. He reports that he does not currently use alcohol. He reports that he does not use drugs.   Physical Exam: BP (!) 143/74    Pulse 97    Ht 5\' 6"  (1.676 m)    Wt 151 lb (68.5 kg)    BMI 24.37 kg/m   Constitutional:  Alert, No acute distress.   Assessment & Plan:    cT2 Nx Mx high risk prostate cancer The pathology report was discussed in  detail At minimum will need ADT which we discussed Schedule CT abdomen/pelvis and bone scan for [name], MD  [facility] [address], [place], [place] [address] [phone]

## 2021-11-11 ENCOUNTER — Encounter: Payer: Self-pay | Admitting: Urology

## 2021-11-30 ENCOUNTER — Encounter
Admission: RE | Admit: 2021-11-30 | Discharge: 2021-11-30 | Disposition: A | Discharge status: Home / Self Care | Payer: Medicare HMO | Source: Ambulatory Visit | Attending: Urology | Admitting: Urology

## 2021-11-30 ENCOUNTER — Ambulatory Visit
Admission: RE | Admit: 2021-11-30 | Discharge: 2021-11-30 | Disposition: A | Discharge status: Home / Self Care | Payer: Medicare HMO | Source: Ambulatory Visit | Attending: Urology | Admitting: Urology

## 2021-11-30 DIAGNOSIS — C61 Malignant neoplasm of prostate: Secondary | ICD-10-CM | POA: Insufficient documentation

## 2021-11-30 DIAGNOSIS — R109 Unspecified abdominal pain: Secondary | ICD-10-CM | POA: Diagnosis not present

## 2021-11-30 DIAGNOSIS — I700 Atherosclerosis of aorta: Secondary | ICD-10-CM | POA: Diagnosis not present

## 2021-11-30 LAB — POCT I-STAT CREATININE: Creatinine, Ser: 1.3 mg/dL — ABNORMAL HIGH (ref 0.61–1.24)

## 2021-11-30 MED ORDER — IOHEXOL 300 MG/ML  SOLN
80.0000 mL | Freq: Once | INTRAMUSCULAR | Status: AC | PRN
  Administered 2021-11-30: 80 mL via INTRAVENOUS

## 2021-11-30 MED ORDER — TECHNETIUM TC 99M MEDRONATE IV KIT
20.0000 | PACK | Freq: Once | INTRAVENOUS | Status: AC | PRN
  Administered 2021-11-30: 21.7 via INTRAVENOUS

## 2021-12-03 ENCOUNTER — Telehealth: Payer: Self-pay | Admitting: Urology

## 2021-12-03 DIAGNOSIS — C61 Malignant neoplasm of prostate: Secondary | ICD-10-CM

## 2021-12-03 NOTE — Telephone Encounter (Signed)
I spoke with Mr. Valeriano regarding his CT and bone scan findings.  CT showed extensive pelvic and retroperitoneal adenopathy along with multiple sclerotic bony lesions.  The possibility of a metastatic lesion in the liver was also noted.  Bone scan showed numerous foci of abnormal tracer uptake in the skull, mandible, cervical/thoracic/lumbar spine, bilateral ribs, bilateral shoulders, left humerus, pelvis and proximal femurs  The findings were discussed in detail with Mr. Petitjean.  I recommended medical oncology appointment for further evaluation and treatment.  All of his questions were answered.

## 2021-12-13 ENCOUNTER — Telehealth: Payer: Self-pay

## 2021-12-13 ENCOUNTER — Encounter: Payer: Self-pay | Admitting: Internal Medicine

## 2021-12-13 ENCOUNTER — Other Ambulatory Visit: Payer: Self-pay

## 2021-12-13 ENCOUNTER — Inpatient Hospital Stay: Payer: Medicare HMO | Attending: Internal Medicine | Admitting: Internal Medicine

## 2021-12-13 ENCOUNTER — Inpatient Hospital Stay: Payer: Medicare HMO

## 2021-12-13 VITALS — BP 122/72 | HR 91 | Temp 97.1°F | Ht 66.0 in | Wt 149.2 lb

## 2021-12-13 DIAGNOSIS — F17210 Nicotine dependence, cigarettes, uncomplicated: Secondary | ICD-10-CM | POA: Diagnosis not present

## 2021-12-13 DIAGNOSIS — Z5111 Encounter for antineoplastic chemotherapy: Secondary | ICD-10-CM | POA: Diagnosis not present

## 2021-12-13 DIAGNOSIS — C61 Malignant neoplasm of prostate: Secondary | ICD-10-CM | POA: Diagnosis not present

## 2021-12-13 DIAGNOSIS — I10 Essential (primary) hypertension: Secondary | ICD-10-CM | POA: Diagnosis not present

## 2021-12-13 DIAGNOSIS — K769 Liver disease, unspecified: Secondary | ICD-10-CM

## 2021-12-13 DIAGNOSIS — C7951 Secondary malignant neoplasm of bone: Secondary | ICD-10-CM | POA: Insufficient documentation

## 2021-12-13 LAB — CBC WITH DIFFERENTIAL/PLATELET
Abs Immature Granulocytes: 0.03 10*3/uL (ref 0.00–0.07)
Basophils Absolute: 0 10*3/uL (ref 0.0–0.1)
Basophils Relative: 0 %
Eosinophils Absolute: 0.1 10*3/uL (ref 0.0–0.5)
Eosinophils Relative: 1 %
HCT: 40.4 % (ref 39.0–52.0)
Hemoglobin: 13.3 g/dL (ref 13.0–17.0)
Immature Granulocytes: 0 %
Lymphocytes Relative: 23 %
Lymphs Abs: 2 10*3/uL (ref 0.7–4.0)
MCH: 32.2 pg (ref 26.0–34.0)
MCHC: 32.9 g/dL (ref 30.0–36.0)
MCV: 97.8 fL (ref 80.0–100.0)
Monocytes Absolute: 0.7 10*3/uL (ref 0.1–1.0)
Monocytes Relative: 8 %
Neutro Abs: 5.6 10*3/uL (ref 1.7–7.7)
Neutrophils Relative %: 68 %
Platelets: 254 10*3/uL (ref 150–400)
RBC: 4.13 MIL/uL — ABNORMAL LOW (ref 4.22–5.81)
RDW: 13.2 % (ref 11.5–15.5)
WBC: 8.4 10*3/uL (ref 4.0–10.5)
nRBC: 0 % (ref 0.0–0.2)

## 2021-12-13 LAB — COMPREHENSIVE METABOLIC PANEL
ALT: 11 U/L (ref 0–44)
AST: 14 U/L — ABNORMAL LOW (ref 15–41)
Albumin: 3.9 g/dL (ref 3.5–5.0)
Alkaline Phosphatase: 458 U/L — ABNORMAL HIGH (ref 38–126)
Anion gap: 8 (ref 5–15)
BUN: 16 mg/dL (ref 8–23)
CO2: 23 mmol/L (ref 22–32)
Calcium: 9 mg/dL (ref 8.9–10.3)
Chloride: 103 mmol/L (ref 98–111)
Creatinine, Ser: 1.25 mg/dL — ABNORMAL HIGH (ref 0.61–1.24)
GFR, Estimated: 59 mL/min — ABNORMAL LOW (ref >60)
Glucose, Bld: 94 mg/dL (ref 70–99)
Potassium: 4.2 mmol/L (ref 3.5–5.1)
Sodium: 134 mmol/L — ABNORMAL LOW (ref 135–145)
Total Bilirubin: 0.4 mg/dL (ref 0.3–1.2)
Total Protein: 7.4 g/dL (ref 6.5–8.1)

## 2021-12-13 LAB — PSA: Prostatic Specific Antigen: 1404 ng/mL — ABNORMAL HIGH (ref 0.00–4.00)

## 2021-12-13 NOTE — Progress Notes (Signed)
START ON PATHWAY REGIMEN - Prostate ° ° °  Abiraterone/prednisone: A cycle is every 28 days: °    Abiraterone acetate (conventional)  °    Prednisone  °  Docetaxel cycles 1 through 6: A cycle is every 21 days: °    Docetaxel  °    Pegfilgrastim-xxxx  ° °**Always confirm dose/schedule in your pharmacy ordering system** ° °Patient Characteristics: °Adenocarcinoma, Recurrent/New Systemic Disease (Including Biochemical Recurrence), Non-Castrate, M1 °Histology: Adenocarcinoma °Therapeutic Status: Recurrent/New Systemic Disease (Including Biochemical Recurrence) °Intent of Therapy: °Non-Curative / Palliative Intent, Discussed with Patient °

## 2021-12-13 NOTE — Telephone Encounter (Signed)
Order for port placement entered and schedule request faxed to specialty scheduling.

## 2021-12-13 NOTE — Progress Notes (Signed)
Concerns with dx and stage of cancer.

## 2021-12-13 NOTE — Assessment & Plan Note (Addendum)
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  Recommend MRI for further evaluation of the liver lesion.  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  # I reviewed the natural history/stage of patient's prostate cancer in detail.  Discussed that unfortunately patient's cancer cannot be cured.  All treatments are palliative and not curative.  #I had a long discussion regarding the concerning findings progressive disease.  Again discussed at length the importance of starting ADT-to treat his progressive prostate cancer.  I reviewed the mechanism of action of ADT/blocking testosterone.  Again reviewed the potential side effects including but not limited to-fatigue hot flashes loss of libido.  Also reviewed that bone health/cardiovascular as long-term complications.  I would recommend Firmagon.  Understands treatments are palliative not curative. Discussed treatments are in general indefinite.  I reviewed the new data from STAMPEDE/CHAARTED clinical trials-in patients with high-risk /castrate sensitive metastatic prostate cancer; that have shown the benefit of chemotherapy with approximately 1-2 years of PFS/also overall survival benefit. Taxotere every 3 weeks 6 cycles is recommended.   #I also reviewed the data of from LATITUDE study, given the use of abiraterone pills plus prednisone-showed to improve survival benefit [median survival-53 versus 36 months].   # I reviewed the recent data from ARASENS/PEACE-1 clinical trial in which patients with high risk/de novo castrate sensitive metastatic prostate cancer-treated with triple therapy [ADT + Taxotere+ Darolutamide or Abiraterone]-showed PFS/overall survival benefit compared to doublet ADT plus Taxotere.  Patient daughter want to be aggressive; however discussed that  I would recommend triplet therapy only if patient after evaluating the patient for the next few visits/labs etc.  #Prostatism symptoms: Consider Flomax.  Thank you Dr. Bernardo Heater for allowing me to participate in the care of your pleasant patient. Please do not hesitate to contact me with questions or concerns in the interim.   # DISPOSITION: afternoon appt [If possible 2/22; 2/23] # labs- cbc/cmp;PSA today # Mills Koller  ASAP # MRI liver ASAP # chemo education re: taxoetere # referral to IR re: port placement  # follow up in 2 weeks- MD; no labs-- Dr.B   # I reviewed the blood work- with the patient in detail; also reviewed the imaging independently [as summarized above]; and with the patient in detail.

## 2021-12-13 NOTE — Progress Notes (Signed)
[facility] OFFICE PROGRESS NOTE  Patient Care Team: Steele Sizer, MD as PCP - General (Family Medicine)   Cancer Staging  Prostate cancer [facility]) Staging form: Prostate, AJCC 8th Edition - Clinical: Stage IVB (cT2c, cN1, cM1, PSA: 374, Grade Group: 5) - Signed by Cammie Sickle, MD on 12/13/2021 Prostate specific antigen (PSA) range: 20 or greater Histologic grading system: 5 grade system    Oncology History Overview Note  compatible with widespread metastatic prostate cancer, including innumerable sclerotic osseous lesions and extensive lymphadenopathy in the pelvis and retroperitoneum, as detailed above. 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 2. There is also an indeterminate hypovascular lesion in segment 5 of the liver which may represent a metastatic lesion. This could be better characterized with follow-up abdominal MRI with and without IV gadolinium if clinically appropriate. 3. Aortic atherosclerosis with mild fusiform aneurysmal dilatation of the infrarenal abdominal aorta measuring up to 3.2 x 3.0 cm. Recommend follow-up ultrasound every 3 years. This recommendation follows ACR consensus guidelines: White Paper of the ACR Incidental Findings Committee II on Vascular Findings. J Am Coll Radiol 2013; 10:789-794. 4. Bladder wall appears mildly thickened, likely related to bladder outlet obstruction given the enlarged prostate gland. 5. Severe colonic diverticulosis without evidence of acute diverticulitis at this time.  IMPRESSION: There are numerous foci of abnormal tracer uptake in the axial and proximal appendicular skeleton consistent with extensive skeletal metastatic disease.     Electronically Signed   By: Elmer Picker M.D.   On: 12/01/2021 16:22  # DEC 2022- 101 [[place]; Urology]   Prostate cancer ([place])  12/13/2021  Initial Diagnosis   Prostate cancer ([place])   12/13/2021 Cancer Staging   Staging form: Prostate, AJCC 8th Edition - Clinical: Stage IVB (cT2c, cN1, cM1, PSA: 374, Grade Group: 5) - Signed by Cammie Sickle, MD on 12/13/2021 Prostate specific antigen (PSA) range: 20 or greater Histologic grading system: 5 grade system    12/13/2021 -  Chemotherapy   Patient is on Treatment Plan : PROSTATE Docetaxel + Prednisone q21d        HISTORY OF PRESENT ILLNESS: Ambulating independently [? cane]; with daughter.   Eric [name] 81 y.o.  male pleasant patient above history of prostate cancer has been referred to [place] by urology discuss further evaluation/treatment.  Patient in December 2022 had a PSA drawn that was 374.  Patient had a further work-up/biopsy with urology-that showed  Pathology: 6/6 cores positive for high-grade prostate cancer  (Gleason 4+4, 5+4, 4+5, 5+3) with percent of tissue involvement ranging 75-100%.  CT scan bone scan done-patient is here to discuss the treatment options.  Patient complains of urgency frequency of poor stream; dribbling.  No hematuria.  Left anterior chest wall- worse with movement. 4/10; on prn Tylenol. No fall; intermittently walks with a cane.  Review of Systems  Constitutional:  Positive for malaise/fatigue and weight loss. Negative for chills, diaphoresis and fever.  HENT:  Negative for nosebleeds and sore throat.   Eyes:  Negative for double vision.  Respiratory:  Negative for cough, hemoptysis, sputum production, shortness of breath and wheezing.   Cardiovascular:  Negative for chest pain, palpitations, orthopnea and leg swelling.  Gastrointestinal:  Negative for abdominal pain, blood in stool, constipation, diarrhea, heartburn, melena, nausea and vomiting.  Genitourinary:  Negative for dysuria, frequency and urgency.  Musculoskeletal:  Positive for back pain and joint pain.  Skin:  Negative.  Negative for itching and rash.  Neurological:   Negative for dizziness, tingling, focal weakness, weakness and headaches.  Endo/Heme/Allergies:  Does not bruise/bleed easily.  Psychiatric/Behavioral:  Negative for depression. The patient is not nervous/anxious and does not have insomnia.      PAST MEDICAL HISTORY :  Past Medical History:  Diagnosis Date   COPD (chronic obstructive pulmonary disease) ([place])    Dyspnea    with exertion   GERD (gastroesophageal reflux disease)    occ   Hyperlipidemia    Hypertension     PAST SURGICAL HISTORY :   Past Surgical History:  Procedure Laterality Date   COLONOSCOPY     HEMORROIDECTOMY     PROSTATE BIOPSY N/A 10/26/2021   Procedure: PROSTATE BIOPSY;  Surgeon: Abbie Sons, MD;  Location: [facility] ORS;  Service: Urology;  Laterality: N/A;   TRANSRECTAL ULTRASOUND N/A 10/26/2021   Procedure: TRANSRECTAL ULTRASOUND;  Surgeon: Abbie Sons, MD;  Location: [facility] ORS;  Service: Urology;  Laterality: N/A;    FAMILY HISTORY :   Family History  Problem Relation Age of Onset   Heart disease Mother    COPD Father     SOCIAL HISTORY:   Social History   Tobacco Use   Smoking status: Every Day    Packs/day: 0.50    Years: 60.00    Pack years: 30.00    Types: Cigarettes   Smokeless tobacco: Never   Tobacco comments:    smoking cessation information provided  Vaping Use   Vaping Use: Never used  Substance Use Topics   Alcohol use: Not Currently   Drug use: No    ALLERGIES:  has No Known Allergies.  MEDICATIONS:  Current Outpatient Medications  Medication Sig Dispense Refill   acetaminophen (TYLENOL) 500 MG tablet Take 1 tablet (500 mg total) by mouth every 6 (six) hours as needed. 30 tablet 0   Ca Carbonate-Mag Hydroxide (ROLAIDS PO) Take 1 tablet by mouth as needed.     ezetimibe (ZETIA) 10 MG tablet Take 1 tablet (10 mg total) by mouth daily. (Patient taking differently: Take 10 mg by mouth every morning.) 90 tablet 1   lisinopril (ZESTRIL) 5 MG tablet Take 1 tablet (5 mg total)  by mouth daily. For bp and kidney protection (Patient taking differently: Take 5 mg by mouth every morning. For bp and kidney protection) 90 tablet 1   metoprolol succinate (TOPROL-XL) 25 MG 24 hr tablet Take 1 tablet (25 mg total) by mouth daily. Take with or immediately following a meal. (Patient taking differently: Take 25 mg by mouth every morning. Take with or immediately following a meal.) 90 tablet 1   rosuvastatin (CRESTOR) 40 MG tablet Take 1 tablet (40 mg total) by mouth daily. (Patient taking differently: Take 40 mg by mouth every morning.) 90 tablet 1   Ipratropium-Albuterol (COMBIVENT) 20-100 MCG/ACT AERS respimat Inhale 1 puff into the lungs every 6 (six) hours. (Patient not taking: Reported on 12/13/2021) 4 g 1   meclizine (ANTIVERT) 25 MG tablet TAKE 1 TABLET (25 MG TOTAL) BY MOUTH DAILY AS NEEDED FOR DIZZINESS. (Patient not taking: Reported on 12/13/2021) 30 tablet 0   No current facility-administered medications for this visit.    PHYSICAL EXAMINATION: ECOG PERFORMANCE STATUS: 1 - Symptomatic but completely ambulatory  BP 122/72 (BP Location: Right Arm, Patient Position: Sitting, Cuff Size: Normal)    Pulse 91    Temp (!) 97.1 F (36.2 C) (Temporal)    Ht 5\' 6"  (1.676 m)  Wt 149 lb 3.2 oz (67.7 kg)    SpO2 100%    BMI 24.08 kg/m   Filed Weights   12/13/21 1341  Weight: 149 lb 3.2 oz (67.7 kg)    Physical Exam Vitals and nursing note reviewed.  HENT:     Head: Normocephalic and atraumatic.     Mouth/Throat:     Pharynx: Oropharynx is clear.  Eyes:     Extraocular Movements: Extraocular movements intact.     Pupils: Pupils are equal, round, and reactive to light.  Cardiovascular:     Rate and Rhythm: Normal rate and regular rhythm.  Pulmonary:     Comments: Decreased breath sounds bilaterally.  Abdominal:     Palpations: Abdomen is soft.  Musculoskeletal:        General: Normal range of motion.     Cervical back: Normal range of motion.  Skin:    General: Skin  is warm.  Neurological:     General: No focal deficit present.     Mental Status: He is alert and oriented to person, place, and time.  Psychiatric:        Behavior: Behavior normal.        Judgment: Judgment normal.    LABORATORY DATA:  I have reviewed the data as listed    Component Value Date/Time   NA 134 (L) 12/13/2021 1516   NA 142 08/17/2015 0928   K 4.2 12/13/2021 1516   CL 103 12/13/2021 1516   CO2 23 12/13/2021 1516   GLUCOSE 94 12/13/2021 1516   BUN 16 12/13/2021 1516   BUN 17 08/17/2015 0928   CREATININE 1.25 (H) 12/13/2021 1516   CREATININE 1.06 09/30/2021 1119   CALCIUM 9.0 12/13/2021 1516   PROT 7.4 12/13/2021 1516   PROT 6.8 08/17/2015 0928   ALBUMIN 3.9 12/13/2021 1516   ALBUMIN 4.1 08/17/2015 0928   AST 14 (L) 12/13/2021 1516   ALT 11 12/13/2021 1516   ALKPHOS 458 (H) 12/13/2021 1516   BILITOT 0.4 12/13/2021 1516   BILITOT 0.4 08/17/2015 0928   GFRNONAA 59 (L) 12/13/2021 1516   GFRNONAA 50 (L) 03/03/2021 1050   GFRAA 59 (L) 03/03/2021 1050    No results found for: SPEP, UPEP  Lab Results  Component Value Date   WBC 8.4 12/13/2021   NEUTROABS 5.6 12/13/2021   HGB 13.3 12/13/2021   HCT 40.4 12/13/2021   MCV 97.8 12/13/2021   PLT 254 12/13/2021      Chemistry      Component Value Date/Time   NA 134 (L) 12/13/2021 1516   NA 142 08/17/2015 0928   K 4.2 12/13/2021 1516   CL 103 12/13/2021 1516   CO2 23 12/13/2021 1516   BUN 16 12/13/2021 1516   BUN 17 08/17/2015 0928   CREATININE 1.25 (H) 12/13/2021 1516   CREATININE 1.06 09/30/2021 1119      Component Value Date/Time   CALCIUM 9.0 12/13/2021 1516   ALKPHOS 458 (H) 12/13/2021 1516   AST 14 (L) 12/13/2021 1516   ALT 11 12/13/2021 1516   BILITOT 0.4 12/13/2021 1516   BILITOT 0.4 08/17/2015 0928       RADIOGRAPHIC STUDIES: I have personally reviewed the radiological images as listed and agreed with the findings in the report. No results found.   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  Recommend MRI for further evaluation of the liver lesion.  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  # I reviewed the natural history/stage of patient's prostate cancer in detail.  Discussed that unfortunately patient's cancer cannot be cured.  All treatments are palliative and not curative.  #I had a long discussion regarding the concerning findings progressive disease.  Again discussed at length the importance of starting ADT-to treat his progressive prostate cancer.  I reviewed the mechanism of action of ADT/blocking testosterone.  Again reviewed the potential side effects including but not limited to-fatigue hot flashes loss of libido.  Also reviewed that bone health/cardiovascular as long-term complications.  I would recommend Firmagon.  Understands treatments are palliative not curative. Discussed treatments are in general indefinite.  I reviewed the new data from STAMPEDE/CHAARTED clinical trials-in patients with high-risk /castrate sensitive metastatic prostate cancer; that have shown the benefit of chemotherapy with approximately 1-2 years of PFS/also overall survival benefit. Taxotere every 3 weeks 6 cycles is recommended.   #I also reviewed the data of from LATITUDE study, given the use of abiraterone pills plus prednisone-showed to improve survival benefit [median survival-53 versus 36 months].   # I reviewed the recent data from ARASENS/PEACE-1 clinical trial in which patients with high risk/de novo castrate sensitive metastatic prostate cancer-treated with triple therapy [ADT + Taxotere+ Darolutamide or Abiraterone]-showed PFS/overall survival benefit compared to doublet ADT plus Taxotere.  Patient daughter want to be aggressive; however  discussed that I would recommend triplet therapy only if patient after evaluating the patient for the next few visits/labs etc.  #Prostatism symptoms: Consider Flomax.  Thank you Dr. Bernardo Heater for allowing me to participate in the care of your pleasant patient. Please do not hesitate to contact me with questions or concerns in the interim.   # DISPOSITION: afternoon appt [If possible 2/22; 2/23] # labs- cbc/cmp;PSA today # Mills Koller  ASAP # MRI liver ASAP # chemo education re: taxoetere # referral to IR re: port placement  # follow up in 2 weeks- MD; no labs-- Dr.B   # I reviewed the blood work- with the patient in detail; also reviewed the imaging independently [as summarized above]; and with the patient in detail.     Orders Placed This Encounter  Procedures   MR LIVER W WO CONTRAST    Standing Status:   Future    Standing Expiration Date:   12/13/2022    Order Specific Question:   If indicated for the ordered procedure, I authorize the administration of contrast media per Radiology protocol    Answer:   Yes    Order Specific Question:   What is the patient's sedation requirement?    Answer:   No Sedation    Order Specific Question:   Does the patient have a pacemaker or implanted devices?    Answer:   No    Order Specific Question:   Preferred imaging location?    Answer:   [facility] (table limit - 550lbs)   IR IMAGING GUIDED PORT INSERTION    Standing Status:   Future    Standing Expiration Date:   12/13/2022    Order Specific Question:   Reason for Exam (SYMPTOM  OR DIAGNOSIS REQUIRED)    Answer:   port a cath placement for chemotherapy    Order Specific Question:   Preferred Imaging Location?    Answer:   [facility]   CBC with Differential/Platelet    Please do manual diff- and call if  abnormal cells seen    Standing Status:   Future    Number of Occurrences:   1    Standing Expiration Date:   12/13/2022   Comprehensive metabolic panel    Standing Status:    Future    Number of Occurrences:   1    Standing Expiration Date:   12/13/2022   PSA    Standing Status:   Future    Number of Occurrences:   1    Standing Expiration Date:   12/13/2022   All questions were answered. The patient knows to call the clinic with any problems, questions or concerns.      Cammie Sickle, MD 12/13/2021 4:46 PM

## 2021-12-15 ENCOUNTER — Inpatient Hospital Stay: Payer: Medicare HMO

## 2021-12-15 ENCOUNTER — Other Ambulatory Visit: Payer: Self-pay

## 2021-12-15 ENCOUNTER — Encounter: Payer: Self-pay | Admitting: Internal Medicine

## 2021-12-15 DIAGNOSIS — C61 Malignant neoplasm of prostate: Secondary | ICD-10-CM

## 2021-12-15 DIAGNOSIS — I10 Essential (primary) hypertension: Secondary | ICD-10-CM | POA: Diagnosis not present

## 2021-12-15 DIAGNOSIS — C7951 Secondary malignant neoplasm of bone: Secondary | ICD-10-CM | POA: Diagnosis not present

## 2021-12-15 DIAGNOSIS — F17210 Nicotine dependence, cigarettes, uncomplicated: Secondary | ICD-10-CM | POA: Diagnosis not present

## 2021-12-15 DIAGNOSIS — Z5111 Encounter for antineoplastic chemotherapy: Secondary | ICD-10-CM | POA: Diagnosis not present

## 2021-12-15 MED ORDER — DEGARELIX ACETATE(240 MG DOSE) 120 MG/VIAL ~~LOC~~ SOLR
240.0000 mg | Freq: Once | SUBCUTANEOUS | Status: AC
  Administered 2021-12-15: 240 mg via SUBCUTANEOUS
  Filled 2021-12-15: qty 6

## 2021-12-15 NOTE — Telephone Encounter (Signed)
Notified patient port placement appt is scheduled Mon 12/20/21 arrive at 12:30 pm for 1:30 pm appointment.

## 2021-12-17 ENCOUNTER — Other Ambulatory Visit: Payer: Self-pay | Admitting: Student

## 2021-12-17 NOTE — Progress Notes (Signed)
Patient on schedule for Port placement 12/20/2021 called and spoke with patient on phone with pre procedure instructions given. Made aware to be here @ 1230, NPO after 0630 and driver post procedure/recovery/discharge. Stated understanding.

## 2021-12-20 ENCOUNTER — Ambulatory Visit
Admission: RE | Admit: 2021-12-20 | Discharge: 2021-12-20 | Disposition: A | Discharge status: Home / Self Care | Payer: Medicare HMO | Source: Ambulatory Visit | Attending: Internal Medicine | Admitting: Internal Medicine

## 2021-12-20 ENCOUNTER — Other Ambulatory Visit: Payer: Self-pay

## 2021-12-20 ENCOUNTER — Encounter: Payer: Self-pay | Admitting: Radiology

## 2021-12-20 DIAGNOSIS — I10 Essential (primary) hypertension: Secondary | ICD-10-CM | POA: Insufficient documentation

## 2021-12-20 DIAGNOSIS — E785 Hyperlipidemia, unspecified: Secondary | ICD-10-CM | POA: Insufficient documentation

## 2021-12-20 DIAGNOSIS — J449 Chronic obstructive pulmonary disease, unspecified: Secondary | ICD-10-CM | POA: Insufficient documentation

## 2021-12-20 DIAGNOSIS — F17210 Nicotine dependence, cigarettes, uncomplicated: Secondary | ICD-10-CM | POA: Insufficient documentation

## 2021-12-20 DIAGNOSIS — Z452 Encounter for adjustment and management of vascular access device: Secondary | ICD-10-CM | POA: Diagnosis not present

## 2021-12-20 DIAGNOSIS — C7951 Secondary malignant neoplasm of bone: Secondary | ICD-10-CM | POA: Diagnosis not present

## 2021-12-20 DIAGNOSIS — C61 Malignant neoplasm of prostate: Secondary | ICD-10-CM | POA: Diagnosis not present

## 2021-12-20 HISTORY — PX: IR IMAGING GUIDED PORT INSERTION: IMG5740

## 2021-12-20 MED ORDER — SODIUM CHLORIDE 0.9 % IV SOLN
INTRAVENOUS | Status: DC
  Filled 2021-12-20: qty 1000

## 2021-12-20 MED ORDER — LIDOCAINE HCL 1 % IJ SOLN
INTRAMUSCULAR | Status: AC
  Administered 2021-12-20: 19 mL
  Filled 2021-12-20: qty 20

## 2021-12-20 MED ORDER — FENTANYL CITRATE (PF) 100 MCG/2ML IJ SOLN
INTRAMUSCULAR | Status: AC
  Filled 2021-12-20: qty 2

## 2021-12-20 MED ORDER — MIDAZOLAM HCL 2 MG/2ML IJ SOLN
INTRAMUSCULAR | Status: AC
  Filled 2021-12-20: qty 2

## 2021-12-20 MED ORDER — LIDOCAINE-EPINEPHRINE 1 %-1:100000 IJ SOLN
INTRAMUSCULAR | Status: AC
  Administered 2021-12-20: 8 mL
  Filled 2021-12-20: qty 1

## 2021-12-20 MED ORDER — FENTANYL CITRATE (PF) 100 MCG/2ML IJ SOLN
INTRAMUSCULAR | Status: AC | PRN
Start: 2021-12-20 — End: 2021-12-20
  Administered 2021-12-20 (×2): 50 ug via INTRAVENOUS

## 2021-12-20 MED ORDER — MIDAZOLAM HCL 2 MG/2ML IJ SOLN
INTRAMUSCULAR | Status: AC | PRN
  Administered 2021-12-20 (×2): 1 mg via INTRAVENOUS

## 2021-12-20 MED ORDER — HEPARIN SOD (PORK) LOCK FLUSH 100 UNIT/ML IV SOLN
INTRAVENOUS | Status: AC
  Administered 2021-12-20: 500 [IU]
  Filled 2021-12-20: qty 5

## 2021-12-20 NOTE — Procedures (Signed)
Interventional Radiology Procedure:   Indications: Prostate cancer  Procedure: Port placement  Findings: Right jugular port, tip at SVC/RA junction  Complications: None     EBL: Minimal, less than 10 ml  Plan: Discharge in one hour.  Keep port site and incisions dry for at least 24 hours.     Cherity Blickenstaff R. Anselm Pancoast, MD  Pager: [phone]

## 2021-12-20 NOTE — H&P (Signed)
Chief Complaint: Patient was seen in consultation today for prostate cancer at the request of Brahmanday,Govinda R  Referring Physician(s): Cammie Sickle  Supervising Physician: Markus Daft  Patient Status: [facility] - Out-pt  History of Present Illness: Eric [name] is a 81 y.o. 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 The patient follows with Oncology and was just seen in office on 2/20 with referral to IR for image guided port a catheter placement.   The patient has had a H&P performed within the last 30 days, all history, medications, and exam have been reviewed. The patient denies any interval changes since the H&P.  The patient denies any current chest pain or shortness of breath. The patient denies any recent infections, fever or chills. The patient denies any history of sleep apnea or chronic oxygen use. He has no known complications to sedation.   Past Medical History:  Diagnosis Date   COPD (chronic obstructive pulmonary disease) ([place])    Dyspnea    with exertion   GERD (gastroesophageal reflux disease)    occ   Hyperlipidemia    Hypertension     Past Surgical History:  Procedure Laterality Date   COLONOSCOPY     HEMORROIDECTOMY     PROSTATE BIOPSY N/A 10/26/2021   Procedure: PROSTATE BIOPSY;  Surgeon: Abbie Sons, MD;  Location: [facility] ORS;  Service: Urology;  Laterality: N/A;   TRANSRECTAL ULTRASOUND N/A 10/26/2021   Procedure: TRANSRECTAL ULTRASOUND;  Surgeon: Abbie Sons, MD;  Location: [facility] ORS;  Service: Urology;  Laterality: N/A;    Allergies: Patient has no known allergies.  Medications: Prior to Admission medications   Medication Sig Start Date End Date Taking? Authorizing Provider  acetaminophen (TYLENOL) 500 MG tablet Take 1 tablet (500 mg total) by mouth every 6 (six) hours as needed. 05/09/18  Yes Sowles, Drue Stager,  MD  ezetimibe (ZETIA) 10 MG tablet Take 1 tablet (10 mg total) by mouth daily. Patient taking differently: Take 10 mg by mouth every morning. 09/03/21  Yes Sowles, Drue Stager, MD  lisinopril (ZESTRIL) 5 MG tablet Take 1 tablet (5 mg total) by mouth daily. For bp and kidney protection Patient taking differently: Take 5 mg by mouth every morning. For bp and kidney protection 09/03/21  Yes Sowles, Drue Stager, MD  metoprolol succinate (TOPROL-XL) 25 MG 24 hr tablet Take 1 tablet (25 mg total) by mouth daily. Take with or immediately following a meal. Patient taking differently: Take 25 mg by mouth every morning. Take with or immediately following a meal. 09/03/21  Yes Sowles, Drue Stager, MD  rosuvastatin (CRESTOR) 40 MG tablet Take 1 tablet (40 mg total) by mouth daily. Patient taking differently: Take 40 mg by mouth every morning. 09/03/21  Yes Sowles, Drue Stager, MD  Ca Carbonate-Mag Hydroxide (ROLAIDS PO) Take 1 tablet by mouth as needed.    [provider]  Ipratropium-Albuterol (COMBIVENT) 20-100 MCG/ACT AERS respimat Inhale 1 puff into the lungs every 6 (six) hours. Patient not taking: Reported on 12/13/2021 04/23/20   Steele Sizer, MD  meclizine (ANTIVERT) 25 MG tablet TAKE 1 TABLET (25 MG TOTAL) BY MOUTH DAILY AS NEEDED FOR DIZZINESS. Patient not taking: Reported on 12/13/2021 07/12/21   Bo Merino, FNP     Family History  Problem Relation Age of Onset   Heart disease Mother    COPD Father     Social History   Socioeconomic History   Marital status: Single  Spouse name: Not on file   Number of children: 1   Years of education: Not on file   Highest education level: High school graduate  Occupational History   Occupation: retired   Tobacco Use   Smoking status: Every Day    Packs/day: 0.50    Years: 60.00    Pack years: 30.00    Types: Cigarettes   Smokeless tobacco: Never   Tobacco comments:    smoking cessation information provided  Vaping Use   Vaping Use: Never used   Substance and Sexual Activity   Alcohol use: Not Currently   Drug use: No   Sexual activity: Yes  Other Topics Concern   Not on file  Social History Narrative   Single, lives with male friend [pt is care giver] for the past 20 plus years. 1/2 ppd smoke; no alcohol. 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 Daughter lives 10-15 mins; in [place].    Social Determinants of Health   Financial Resource Strain: Low Risk    Difficulty of Paying Living Expenses: Not hard at all  Food Insecurity: No Food Insecurity   Worried About Charity fundraiser in the Last Year: Never true   [place] in the Last Year: Never true  Transportation Needs: No Transportation Needs   Lack of Transportation (Medical): No   Lack of Transportation (Non-Medical): No  Physical Activity: Inactive   Days of Exercise per Week: 0 days   Minutes of Exercise per Session: 0 min  Stress: No Stress Concern Present   Feeling of Stress : Not at all  Social Connections: Moderately Integrated   Frequency of Communication with Friends and Family: Three times a week   Frequency of Social Gatherings with Friends and Family: Three times a week   Attends Religious Services: 1 to 4 times per year   Active Member of Clubs or Organizations: No   Attends Archivist Meetings: Never   Marital Status: Living with partner   Review of Systems: A 12 point ROS discussed and pertinent positives are indicated in the HPI above.  All other systems are negative.  Review of Systems  Vital Signs: BP (!) 146/72    Pulse 93    Temp 98.1 F (36.7 C) (Oral)    Resp 20    Ht 5\' 6"  (1.676 m)    Wt 149 lb (67.6 kg)    SpO2 98%    BMI 24.05 kg/m   Physical Exam HENT:     Head: Normocephalic and atraumatic.  Cardiovascular:     Rate and Rhythm: Normal rate and regular rhythm.  Pulmonary:     Effort: Pulmonary effort is normal.     Breath sounds: Normal breath sounds.  Abdominal:     General: There is no  distension.     Palpations: Abdomen is soft.  Skin:    General: Skin is warm and dry.  Neurological:     Mental Status: He is alert and oriented to person, place, and time.    Imaging: NM Bone Scan Whole Body  Result Date: 12/01/2021 CLINICAL DATA:  Prostate carcinoma EXAM: NUCLEAR MEDICINE WHOLE BODY BONE SCAN TECHNIQUE: Whole body anterior and posterior images were obtained approximately 3 hours after intravenous injection of radiopharmaceutical. RADIOPHARMACEUTICALS:  21.7 mCi Technetium-33m MDP IV COMPARISON:  None. FINDINGS: There are numerous foci of abnormal tracer uptake in the skull, left side of mandible, cervical spine, thoracic spine, bilateral ribs, lumbar spine, bilateral shoulders, left  humerus, pelvis and both proximal femurs. There is relatively decreased soft tissue uptake suggesting possible super scan due to extensive skeletal metastatic disease. There is activity in the urinary bladder. IMPRESSION: There are numerous foci of abnormal tracer uptake in the axial and proximal appendicular skeleton consistent with extensive skeletal metastatic disease. Electronically Signed   By: Elmer Picker M.D.   On: 12/01/2021 16:22   CT Abdomen Pelvis W Contrast  Result Date: 12/01/2021 CLINICAL DATA:  81 year old male history of prostate cancer. Staging examination. Left-sided abdominal pain. EXAM: CT ABDOMEN AND PELVIS WITH CONTRAST TECHNIQUE: Multidetector CT imaging of the abdomen and pelvis was performed using the standard protocol following bolus administration of intravenous contrast. RADIATION DOSE REDUCTION: This exam was performed according to the departmental dose-optimization program which includes automated exposure control, adjustment of the mA and/or kV according to patient size and/or use of iterative reconstruction technique. CONTRAST:  62mL OMNIPAQUE IOHEXOL 300 MG/ML  SOLN COMPARISON:  No priors. FINDINGS: Lower chest: Mild scarring in the right middle lobe.  Atherosclerotic calcifications in the descending thoracic aorta. Small hiatal hernia. Hepatobiliary: Ill-defined hypovascular lesion in segment 5 of the liver (axial image 26 of series 2). No intra or extrahepatic biliary ductal dilatation. Gallbladder is nearly decompressed, but otherwise unremarkable in appearance. Pancreas: No pancreatic mass. No pancreatic ductal dilatation. No pancreatic or peripancreatic fluid collections or inflammatory changes. Spleen: Unremarkable. Adrenals/Urinary Tract: Multiple renal lesions bilaterally, largest of which are compatible with simple cysts, measuring up to 2.8 cm in the upper pole of the left kidney. Subcentimeter low-attenuation lesions are too small to definitively characterize, but are favored to represent tiny cysts. Bilateral adrenal glands are normal in appearance. No hydroureteronephrosis. Urinary bladder wall appears mildly thickened, without focal bladder wall mass. Stomach/Bowel: The appearance of the stomach is normal. There is no pathologic dilatation of small bowel or colon. Numerous colonic diverticulae are noted, without surrounding inflammatory changes to indicate an acute diverticulitis at this time. Normal appendix. Vascular/Lymphatic: Aortic atherosclerosis with mild fusiform aneurysmal dilatation of the infrarenal abdominal aorta which measures up to 3.2 x 3.0 cm. Multiple enlarged and borderline enlarged pelvic lymph nodes are noted bilaterally (right greater than left). The largest of these is in the right external iliac nodal station (axial image 55 of series 2) measuring 1.6 cm in short axis. There is also bulky lymphadenopathy in the upper right pelvis intimately associated with the right common iliac vein which is either severely stenotic or occluded, with lymph nodes measuring 1.3 and 1.4 cm in short axis in this region (axial image 44 of series 2). Extensive retroperitoneal lymphadenopathy is also noted, with the largest retroperitoneal lymph  node measuring up to 1.3 cm in short axis in the retrocaval nodal station (axial image 29 of series 2). Reproductive: Prostate gland is enlarged and heterogeneous in appearance with median lobe hypertrophy. Other: No significant volume of ascites.  No pneumoperitoneum. Musculoskeletal: Numerous sclerotic lesions are noted throughout the visualized axial and appendicular skeleton, indicative of widespread metastatic disease to the bones. IMPRESSION: 1. Findings are compatible with widespread metastatic prostate cancer, including innumerable sclerotic osseous lesions and extensive lymphadenopathy in the pelvis and retroperitoneum, as detailed above. 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 2. There is also an indeterminate hypovascular lesion in segment 5 of the liver which may represent a metastatic lesion. This could be better characterized with follow-up abdominal MRI with and without IV gadolinium  if clinically appropriate. 3. Aortic atherosclerosis with mild fusiform aneurysmal dilatation of the infrarenal abdominal aorta measuring up to 3.2 x 3.0 cm. Recommend follow-up ultrasound every 3 years. This recommendation follows ACR consensus guidelines: White Paper of the ACR Incidental Findings Committee II on Vascular Findings. J Am Coll Radiol 2013; 10:789-794. 4. Bladder wall appears mildly thickened, likely related to bladder outlet obstruction given the enlarged prostate gland. 5. Severe colonic diverticulosis without evidence of acute diverticulitis at this time. Electronically Signed   By: Vinnie Langton M.D.   On: 12/01/2021 08:04    Labs:  CBC: Recent Labs    03/03/21 1050 12/13/21 1516  WBC 9.0 8.4  HGB 14.5 13.3  HCT 44.3 40.4  PLT 346 254    COAGS: No results for input(s): INR, APTT in the last 8760 hours.  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 The patient follows with Oncology and was just seen in office on 2/20 with referral to IR for image guided port a catheter placement.   The patient has been NPO, no change in medical history since last office visit, labs and vitals have been reviewed.  Risks and benefits of image guided port-a-catheter placement was discussed with the patient including, but not limited to bleeding, infection, pneumothorax, or fibrin sheath development and need for additional procedures.  All of the patient's questions were answered, patient is agreeable to proceed. Consent signed and in chart.   Thank you for this interesting consult.  I greatly enjoyed meeting Eric [name] and look forward to participating in their care.  A copy of this report was sent to the requesting provider on this date.  Electronically Signed: Hedy Jacob, PA-C 12/20/2021, 1:21 PM   I spent a total of 15 Minutes in face to face in clinical consultation, greater than 50% of which was counseling/coordinating care for prostate cancer.

## 2021-12-21 ENCOUNTER — Inpatient Hospital Stay: Payer: Medicare HMO

## 2021-12-22 ENCOUNTER — Other Ambulatory Visit: Payer: Self-pay

## 2021-12-22 ENCOUNTER — Ambulatory Visit
Admission: RE | Admit: 2021-12-22 | Discharge: 2021-12-22 | Disposition: A | Discharge status: Home / Self Care | Payer: Medicare HMO | Source: Ambulatory Visit | Attending: Internal Medicine | Admitting: Internal Medicine

## 2021-12-22 DIAGNOSIS — K769 Liver disease, unspecified: Secondary | ICD-10-CM | POA: Insufficient documentation

## 2021-12-22 DIAGNOSIS — N281 Cyst of kidney, acquired: Secondary | ICD-10-CM | POA: Diagnosis not present

## 2021-12-22 DIAGNOSIS — K5730 Diverticulosis of large intestine without perforation or abscess without bleeding: Secondary | ICD-10-CM | POA: Diagnosis not present

## 2021-12-22 DIAGNOSIS — Z8546 Personal history of malignant neoplasm of prostate: Secondary | ICD-10-CM | POA: Diagnosis not present

## 2021-12-22 MED ORDER — GADOBUTROL 1 MMOL/ML IV SOLN
6.0000 mL | Freq: Once | INTRAVENOUS | Status: AC | PRN
  Administered 2021-12-22: 6 mL via INTRAVENOUS

## 2021-12-27 ENCOUNTER — Encounter: Payer: Self-pay | Admitting: Internal Medicine

## 2021-12-27 ENCOUNTER — Other Ambulatory Visit: Payer: Self-pay

## 2021-12-27 ENCOUNTER — Inpatient Hospital Stay: Payer: Medicare HMO

## 2021-12-27 ENCOUNTER — Inpatient Hospital Stay: Payer: Medicare HMO | Attending: Internal Medicine | Admitting: Internal Medicine

## 2021-12-27 DIAGNOSIS — K649 Unspecified hemorrhoids: Secondary | ICD-10-CM | POA: Insufficient documentation

## 2021-12-27 DIAGNOSIS — K1230 Oral mucositis (ulcerative), unspecified: Secondary | ICD-10-CM | POA: Diagnosis not present

## 2021-12-27 DIAGNOSIS — F17210 Nicotine dependence, cigarettes, uncomplicated: Secondary | ICD-10-CM | POA: Insufficient documentation

## 2021-12-27 DIAGNOSIS — K5730 Diverticulosis of large intestine without perforation or abscess without bleeding: Secondary | ICD-10-CM | POA: Insufficient documentation

## 2021-12-27 DIAGNOSIS — R112 Nausea with vomiting, unspecified: Secondary | ICD-10-CM | POA: Diagnosis not present

## 2021-12-27 DIAGNOSIS — C7951 Secondary malignant neoplasm of bone: Secondary | ICD-10-CM | POA: Insufficient documentation

## 2021-12-27 DIAGNOSIS — T451X5A Adverse effect of antineoplastic and immunosuppressive drugs, initial encounter: Secondary | ICD-10-CM | POA: Insufficient documentation

## 2021-12-27 DIAGNOSIS — F419 Anxiety disorder, unspecified: Secondary | ICD-10-CM | POA: Insufficient documentation

## 2021-12-27 DIAGNOSIS — Z5189 Encounter for other specified aftercare: Secondary | ICD-10-CM | POA: Diagnosis not present

## 2021-12-27 DIAGNOSIS — I10 Essential (primary) hypertension: Secondary | ICD-10-CM | POA: Diagnosis not present

## 2021-12-27 DIAGNOSIS — K121 Other forms of stomatitis: Secondary | ICD-10-CM | POA: Diagnosis not present

## 2021-12-27 DIAGNOSIS — R110 Nausea: Secondary | ICD-10-CM | POA: Insufficient documentation

## 2021-12-27 DIAGNOSIS — C61 Malignant neoplasm of prostate: Secondary | ICD-10-CM | POA: Diagnosis not present

## 2021-12-27 DIAGNOSIS — Z5111 Encounter for antineoplastic chemotherapy: Secondary | ICD-10-CM | POA: Diagnosis not present

## 2021-12-27 MED ORDER — TAMSULOSIN HCL 0.4 MG PO CAPS: 0.4000 mg | ORAL_CAPSULE | Freq: Every day | ORAL | 3 refills | Status: DC

## 2021-12-27 MED ORDER — ONDANSETRON HCL 8 MG PO TABS: ORAL_TABLET | ORAL | 1 refills | Status: DC

## 2021-12-27 MED ORDER — DEXAMETHASONE 4 MG PO TABS: ORAL_TABLET | ORAL | 0 refills | Status: DC

## 2021-12-27 MED ORDER — PROCHLORPERAZINE MALEATE 10 MG PO TABS: 10.0000 mg | ORAL_TABLET | Freq: Four times a day (QID) | ORAL | 1 refills | Status: DC | PRN

## 2021-12-27 MED ORDER — LIDOCAINE-PRILOCAINE 2.5-2.5 % EX CREA: 1.0000 "application " | TOPICAL_CREAM | CUTANEOUS | 2 refills | Status: AC | PRN

## 2021-12-27 NOTE — Progress Notes (Signed)
[place] OFFICE PROGRESS NOTE  Patient Care Team: Steele Sizer, MD as PCP - General (Family Medicine)   Cancer Staging  Prostate cancer [facility]) Staging form: Prostate, AJCC 8th Edition - Clinical: Stage IVB (cT2c, cN1, cM1, PSA: 374, Grade Group: 5) - Signed by Cammie Sickle, MD on 12/13/2021 Prostate specific antigen (PSA) range: 20 or greater Histologic grading system: 5 grade system    Oncology History Overview Note  compatible with widespread metastatic prostate cancer, including innumerable sclerotic osseous lesions and extensive lymphadenopathy in the pelvis and retroperitoneum, as detailed above. 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 2. There is also an indeterminate hypovascular lesion in segment 5 of the liver which may represent a metastatic lesion. This could be better characterized with follow-up abdominal MRI with and without IV gadolinium if clinically appropriate. 3. Aortic atherosclerosis with mild fusiform aneurysmal dilatation of the infrarenal abdominal aorta measuring up to 3.2 x 3.0 cm. Recommend follow-up ultrasound every 3 years. This recommendation follows ACR consensus guidelines: White Paper of the ACR Incidental Findings Committee II on Vascular Findings. J Am Coll Radiol 2013; 10:789-794. 4. Bladder wall appears mildly thickened, likely related to bladder outlet obstruction given the enlarged prostate gland. 5. Severe colonic diverticulosis without evidence of acute diverticulitis at this time.  IMPRESSION: There are numerous foci of abnormal tracer uptake in the axial and proximal appendicular skeleton consistent with extensive skeletal metastatic disease.     #Stage IV castrate sensitive prostate cancer-December/20 2023-MRI negative for any liver lesions.; FEB 24th, 2022-Firmagon loading dose;  # march  24th-Taxoeter with udenyca  # DEC 2022- 374 [[place]; Urology]   Prostate cancer ([place])  12/13/2021 Initial Diagnosis   Prostate cancer ([place])   12/13/2021 Cancer Staging   Staging form: Prostate, AJCC 8th Edition - Clinical: Stage IVB (cT2c, cN1, cM1, PSA: 374, Grade Group: 5) - Signed by Cammie Sickle, MD on 12/13/2021 Prostate specific antigen (PSA) range: 20 or greater Histologic grading system: 5 grade system    12/13/2021 -  Chemotherapy   Patient is on Treatment Plan : PROSTATE Docetaxel + Prednisone q21d        HISTORY OF PRESENT ILLNESS: Ambulating independently [? cane]; with daughter.   Eric [name] 81 y.o.  male pleasant patient above history of castrate sensitive metastatic prostate cancer is here to review the results of his MRI liver /labs.  In the interim patient had a Mediport placed.  Patient also status post [place].  Denies any hot flashes.  Complains of mild soreness around the site of injection.  Patient complains of urgency frequency of poor stream; dribbling.  No hematuria. Left anterior chest wall- worse with movement. 4/10; on prn Tylenol. No fall; intermittently walks with a cane.  Review of Systems  Constitutional:  Positive for malaise/fatigue and weight loss. Negative for chills, diaphoresis and fever.  HENT:  Negative for nosebleeds and sore throat.   Eyes:  Negative for double vision.  Respiratory:  Negative for cough, hemoptysis, sputum production, shortness of breath and wheezing.   Cardiovascular:  Negative for chest pain, palpitations, orthopnea and leg swelling.  Gastrointestinal:  Negative for abdominal pain, blood in stool, constipation, diarrhea, heartburn, melena, nausea and vomiting.  Genitourinary:  Negative for dysuria, frequency and urgency.  Musculoskeletal:  Positive for back pain and joint pain.  Skin: Negative.  Negative for itching and rash.  Neurological:  Negative for dizziness, tingling,  focal weakness, weakness and  headaches.  Endo/Heme/Allergies:  Does not bruise/bleed easily.  Psychiatric/Behavioral:  Negative for depression. The patient is not nervous/anxious and does not have insomnia.      PAST MEDICAL HISTORY :  Past Medical History:  Diagnosis Date   COPD (chronic obstructive pulmonary disease) ([place])    Dyspnea    with exertion   GERD (gastroesophageal reflux disease)    occ   Hyperlipidemia    Hypertension     PAST SURGICAL HISTORY :   Past Surgical History:  Procedure Laterality Date   COLONOSCOPY     HEMORROIDECTOMY     IR IMAGING GUIDED PORT INSERTION  12/20/2021   PROSTATE BIOPSY N/A 10/26/2021   Procedure: PROSTATE BIOPSY;  Surgeon: Abbie Sons, MD;  Location: [facility] ORS;  Service: Urology;  Laterality: N/A;   TRANSRECTAL ULTRASOUND N/A 10/26/2021   Procedure: TRANSRECTAL ULTRASOUND;  Surgeon: Abbie Sons, MD;  Location: [facility] ORS;  Service: Urology;  Laterality: N/A;    FAMILY HISTORY :   Family History  Problem Relation Age of Onset   Heart disease Mother    COPD Father     SOCIAL HISTORY:   Social History   Tobacco Use   Smoking status: Every Day    Packs/day: 0.50    Years: 60.00    Pack years: 30.00    Types: Cigarettes   Smokeless tobacco: Never   Tobacco comments:    smoking cessation information provided  Vaping Use   Vaping Use: Never used  Substance Use Topics   Alcohol use: Not Currently   Drug use: No    ALLERGIES:  has No Known Allergies.  MEDICATIONS:  Current Outpatient Medications  Medication Sig Dispense Refill   acetaminophen (TYLENOL) 500 MG tablet Take 1 tablet (500 mg total) by mouth every 6 (six) hours as needed. 30 tablet 0   Ca Carbonate-Mag Hydroxide (ROLAIDS PO) Take 1 tablet by mouth as needed.     dexamethasone (DECADRON) 4 MG tablet Take one pill AM & PM x 2 days-TAKE the day prior to chemo; and the day after chemo; do not take on the day of chemo. 60 tablet 0   ezetimibe (ZETIA) 10 MG tablet Take 1 tablet (10 mg total)  by mouth daily. (Patient taking differently: Take 10 mg by mouth every morning.) 90 tablet 1   lidocaine-prilocaine (EMLA) cream Apply 1 application. topically as needed. 30 g 2   lisinopril (ZESTRIL) 5 MG tablet Take 1 tablet (5 mg total) by mouth daily. For bp and kidney protection (Patient taking differently: Take 5 mg by mouth every morning. For bp and kidney protection) 90 tablet 1   metoprolol succinate (TOPROL-XL) 25 MG 24 hr tablet Take 1 tablet (25 mg total) by mouth daily. Take with or immediately following a meal. (Patient taking differently: Take 25 mg by mouth every morning. Take with or immediately following a meal.) 90 tablet 1   ondansetron (ZOFRAN) 8 MG tablet One pill every 8 hours as needed for nausea/vomitting. 40 tablet 1   prochlorperazine (COMPAZINE) 10 MG tablet Take 1 tablet (10 mg total) by mouth every 6 (six) hours as needed for nausea or vomiting. 40 tablet 1   rosuvastatin (CRESTOR) 40 MG tablet Take 1 tablet (40 mg total) by mouth daily. (Patient taking differently: Take 40 mg by mouth every morning.) 90 tablet 1   tamsulosin (FLOMAX) 0.4 MG CAPS capsule Take 1 capsule (0.4 mg total) by mouth daily after supper. 30 capsule 3  Ipratropium-Albuterol (COMBIVENT) 20-100 MCG/ACT AERS respimat Inhale 1 puff into the lungs every 6 (six) hours. (Patient not taking: Reported on 12/13/2021) 4 g 1   meclizine (ANTIVERT) 25 MG tablet TAKE 1 TABLET (25 MG TOTAL) BY MOUTH DAILY AS NEEDED FOR DIZZINESS. (Patient not taking: Reported on 12/13/2021) 30 tablet 0   No current facility-administered medications for this visit.    PHYSICAL EXAMINATION: ECOG PERFORMANCE STATUS: 1 - Symptomatic but completely ambulatory  BP (!) 213/72 (BP Location: Right Arm, Patient Position: Sitting, Cuff Size: Normal)    Pulse 93    Temp 97.8 F (36.6 C) (Tympanic)    Ht '5\' 6"'$  (1.676 m)    Wt 148 lb 9.6 oz (67.4 kg)    SpO2 99%    BMI 23.98 kg/m   Filed Weights   12/27/21 1100  Weight: 148 lb 9.6 oz  (67.4 kg)    Physical Exam Vitals and nursing note reviewed.  HENT:     Head: Normocephalic and atraumatic.     Mouth/Throat:     Pharynx: Oropharynx is clear.  Eyes:     Extraocular Movements: Extraocular movements intact.     Pupils: Pupils are equal, round, and reactive to light.  Cardiovascular:     Rate and Rhythm: Normal rate and regular rhythm.  Pulmonary:     Comments: Decreased breath sounds bilaterally.  Abdominal:     Palpations: Abdomen is soft.  Musculoskeletal:        General: Normal range of motion.     Cervical back: Normal range of motion.  Skin:    General: Skin is warm.  Neurological:     General: No focal deficit present.     Mental Status: He is alert and oriented to person, place, and time.  Psychiatric:        Behavior: Behavior normal.        Judgment: Judgment normal.    LABORATORY DATA:  I have reviewed the data as listed    Component Value Date/Time   NA 134 (L) 12/13/2021 1516   NA 142 08/17/2015 0928   K 4.2 12/13/2021 1516   CL 103 12/13/2021 1516   CO2 23 12/13/2021 1516   GLUCOSE 94 12/13/2021 1516   BUN 16 12/13/2021 1516   BUN 17 08/17/2015 0928   CREATININE 1.25 (H) 12/13/2021 1516   CREATININE 1.06 09/30/2021 1119   CALCIUM 9.0 12/13/2021 1516   PROT 7.4 12/13/2021 1516   PROT 6.8 08/17/2015 0928   ALBUMIN 3.9 12/13/2021 1516   ALBUMIN 4.1 08/17/2015 0928   AST 14 (L) 12/13/2021 1516   ALT 11 12/13/2021 1516   ALKPHOS 458 (H) 12/13/2021 1516   BILITOT 0.4 12/13/2021 1516   BILITOT 0.4 08/17/2015 0928   GFRNONAA 59 (L) 12/13/2021 1516   GFRNONAA 50 (L) 03/03/2021 1050   GFRAA 59 (L) 03/03/2021 1050    No results found for: SPEP, UPEP  Lab Results  Component Value Date   WBC 8.4 12/13/2021   NEUTROABS 5.6 12/13/2021   HGB 13.3 12/13/2021   HCT 40.4 12/13/2021   MCV 97.8 12/13/2021   PLT 254 12/13/2021      Chemistry      Component Value Date/Time   NA 134 (L) 12/13/2021 1516   NA 142 08/17/2015 0928   K 4.2  12/13/2021 1516   CL 103 12/13/2021 1516   CO2 23 12/13/2021 1516   BUN 16 12/13/2021 1516   BUN 17 08/17/2015 0928   CREATININE 1.25 (H) 12/13/2021 1516  CREATININE 1.06 09/30/2021 1119      Component Value Date/Time   CALCIUM 9.0 12/13/2021 1516   ALKPHOS 458 (H) 12/13/2021 1516   AST 14 (L) 12/13/2021 1516   ALT 11 12/13/2021 1516   BILITOT 0.4 12/13/2021 1516   BILITOT 0.4 08/17/2015 0928       RADIOGRAPHIC STUDIES: I have personally reviewed the radiological images as listed and agreed with the findings in the report. No results found.   ASSESSMENT & PLAN:  Prostate cancer ([place]) #Stage IV-castrate sensitive prostate cancer with multiple bone metastases/extensive lymphadenopathy;  lymphadenopathy in the upper right hemipelvis which likely causes severe stenosis or complete occlusion of the right common iliac vein. MRI- liver-1st-March 2023 hemangioma.  #1 loading [place] 12/17/2021.   #Patient understands treatments are palliative not curative.  Given the bulky disease would recommend Taxotere [60 mg per metered square] every 3 weeks.  Consider adding Darolutamide or Abiraterone if patient tolerating chemotherapy well.   # Growth factor- would be given as prophylaxis for chemotherapy-induced neutropenia to prevent febrile neutropenias. Discussed potential side effect- myalgias/arthralgias- recommend Claritin for 4 days.   # Discussed the potential side effects including but not limited to-increasing fatigue, nausea vomiting, diarrhea, hair loss, sores in the mouth, increase risk of infection and also neuropathy.  Status postchemotherapy education.    #Prostatism symptoms: order  Flomax; discussed regarding orthostasis with Flomax.   # DISPOSITION:   # No firmagn today # follow up on march, 22nd 2023 MD; labs-cbc/cmp;PSA; taxoetre;Firmagon d-2 Ellen Henri-- Dr.B      Orders Placed This Encounter  Procedures   CBC with Differential/Platelet    Standing Status:   Future     Standing Expiration Date:   12/28/2022   Comprehensive metabolic panel    Standing Status:   Future    Standing Expiration Date:   12/28/2022   PSA    Standing Status:   Future    Standing Expiration Date:   12/28/2022   All questions were answered. The patient knows to call the clinic with any problems, questions or concerns.      Cammie Sickle, MD 12/27/2021 2:01 PM

## 2021-12-27 NOTE — Progress Notes (Signed)
Needs rx for emla. ? ?When can he take the tape off of his port? ?

## 2021-12-27 NOTE — Assessment & Plan Note (Addendum)
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 MRI- liver-1st-March 2023 hemangioma.  #1 loading [place] 12/17/2021.  ? ?#Patient understands treatments are palliative not curative.  Given the bulky disease would recommend Taxotere [60 mg per metered square] every 3 weeks.  Consider adding Darolutamide or Abiraterone if patient tolerating chemotherapy well.  ? ?# Growth factor- would be given as prophylaxis for chemotherapy-induced neutropenia to prevent febrile neutropenias. Discussed potential side effect- myalgias/arthralgias- recommend Claritin for 4 days.  ? ?# Discussed the potential side effects including but not limited to-increasing fatigue, nausea vomiting, diarrhea, hair loss, sores in the mouth, increase risk of infection and also neuropathy.  Status postchemotherapy education.  ?  ?#Prostatism symptoms: order  Flomax; discussed regarding orthostasis with Flomax.  ? ?# DISPOSITION:   ?# No firmagn today ?# follow up on march, 22nd 2023 MD; labs-cbc/cmp;PSA; taxoetre;Firmagon d-2 Ellen Henri-- Dr.B  ? ? ?

## 2022-01-05 ENCOUNTER — Inpatient Hospital Stay: Payer: Medicare HMO | Admitting: Hospice and Palliative Medicine

## 2022-01-05 DIAGNOSIS — C61 Malignant neoplasm of prostate: Secondary | ICD-10-CM

## 2022-01-05 NOTE — Progress Notes (Signed)
Pharmacist Chemotherapy Monitoring - Initial Assessment   ? ?Anticipated start date: 01/12/22  ? ?The following has been reviewed per standard work regarding the patient's treatment regimen: ?The patient's diagnosis, treatment plan and drug doses, and organ/hematologic function ?Lab orders and baseline tests specific to treatment regimen  ?The treatment plan start date, drug sequencing, and pre-medications ?Prior authorization status  ?Patient's documented medication list, including drug-drug interaction screen and prescriptions for anti-emetics and supportive care specific to the treatment regimen ?The drug concentrations, fluid compatibility, administration routes, and timing of the medications to be used ?The patient's access for treatment and lifetime cumulative dose history, if applicable  ?The patient's medication allergies and previous infusion related reactions, if applicable  ? ?Changes made to treatment plan:  ?treatment plan date ? ?Follow up needed:  ?signing treatment plan ? ? ?Adelina Mings, [facility], ?01/05/2022  1:47 PM  ?

## 2022-01-05 NOTE — Progress Notes (Signed)
Multidisciplinary Oncology Council Documentation ? ?Eric [name] was presented by our [facility] on 01/05/2022, which included representatives from:  ?Palliative Care ?Dietitian  ?Physical/Occupational Therapist ?Nurse Navigator ?Genetics ?Speech Therapist ?Social work ?Survivorship RN ?Financial Navigator ?Research RN ? ? ?Eric [name] currently presents with history of prostate cancer. ? ?We reviewed previous medical and familial history, history of present illness, and recent lab results along with all available histopathologic and imaging studies. The [place] considered available treatment options and made the following recommendations/referrals: ? ?Social work ?Nutrition ?Genetics ? ?The MOC is a meeting of clinicians from various specialty areas who evaluate and discuss patients for whom a multidisciplinary approach is being considered. Final determinations in the plan of care are those of the provider(s).  ? ?Today's extended care, comprehensive team conference, Eric [name] was not present for the discussion and was not examined.   ?

## 2022-01-12 ENCOUNTER — Encounter: Payer: Self-pay | Admitting: Internal Medicine

## 2022-01-12 ENCOUNTER — Telehealth: Payer: Self-pay | Admitting: Pharmacy Technician

## 2022-01-12 ENCOUNTER — Other Ambulatory Visit: Payer: Self-pay

## 2022-01-12 ENCOUNTER — Encounter: Payer: Self-pay | Admitting: Nurse Practitioner

## 2022-01-12 ENCOUNTER — Telehealth: Payer: Self-pay | Admitting: Pharmacist

## 2022-01-12 ENCOUNTER — Inpatient Hospital Stay: Payer: Medicare HMO | Admitting: Nurse Practitioner

## 2022-01-12 ENCOUNTER — Inpatient Hospital Stay: Payer: Medicare HMO

## 2022-01-12 ENCOUNTER — Other Ambulatory Visit (HOSPITAL_COMMUNITY): Payer: Self-pay

## 2022-01-12 VITALS — BP 124/61 | HR 73 | Temp 98.7°F | Resp 19 | Wt 149.1 lb

## 2022-01-12 VITALS — BP 107/80 | HR 90 | Resp 18

## 2022-01-12 DIAGNOSIS — T451X5A Adverse effect of antineoplastic and immunosuppressive drugs, initial encounter: Secondary | ICD-10-CM | POA: Diagnosis not present

## 2022-01-12 DIAGNOSIS — F411 Generalized anxiety disorder: Secondary | ICD-10-CM

## 2022-01-12 DIAGNOSIS — R112 Nausea with vomiting, unspecified: Secondary | ICD-10-CM | POA: Diagnosis not present

## 2022-01-12 DIAGNOSIS — C61 Malignant neoplasm of prostate: Secondary | ICD-10-CM | POA: Diagnosis not present

## 2022-01-12 DIAGNOSIS — C801 Malignant (primary) neoplasm, unspecified: Secondary | ICD-10-CM | POA: Diagnosis not present

## 2022-01-12 DIAGNOSIS — C7951 Secondary malignant neoplasm of bone: Secondary | ICD-10-CM | POA: Diagnosis not present

## 2022-01-12 DIAGNOSIS — K1230 Oral mucositis (ulcerative), unspecified: Secondary | ICD-10-CM | POA: Diagnosis not present

## 2022-01-12 DIAGNOSIS — K649 Unspecified hemorrhoids: Secondary | ICD-10-CM | POA: Diagnosis not present

## 2022-01-12 DIAGNOSIS — Z5111 Encounter for antineoplastic chemotherapy: Secondary | ICD-10-CM

## 2022-01-12 DIAGNOSIS — R110 Nausea: Secondary | ICD-10-CM | POA: Diagnosis not present

## 2022-01-12 DIAGNOSIS — K121 Other forms of stomatitis: Secondary | ICD-10-CM | POA: Diagnosis not present

## 2022-01-12 LAB — CBC WITH DIFFERENTIAL/PLATELET
Abs Immature Granulocytes: 0.02 10*3/uL (ref 0.00–0.07)
Basophils Absolute: 0 10*3/uL (ref 0.0–0.1)
Basophils Relative: 0 %
Eosinophils Absolute: 0.1 10*3/uL (ref 0.0–0.5)
Eosinophils Relative: 1 %
HCT: 36.7 % — ABNORMAL LOW (ref 39.0–52.0)
Hemoglobin: 12.1 g/dL — ABNORMAL LOW (ref 13.0–17.0)
Immature Granulocytes: 0 %
Lymphocytes Relative: 26 %
Lymphs Abs: 2 10*3/uL (ref 0.7–4.0)
MCH: 31.8 pg (ref 26.0–34.0)
MCHC: 33 g/dL (ref 30.0–36.0)
MCV: 96.6 fL (ref 80.0–100.0)
Monocytes Absolute: 0.6 10*3/uL (ref 0.1–1.0)
Monocytes Relative: 8 %
Neutro Abs: 5.1 10*3/uL (ref 1.7–7.7)
Neutrophils Relative %: 65 %
Platelets: 229 10*3/uL (ref 150–400)
RBC: 3.8 MIL/uL — ABNORMAL LOW (ref 4.22–5.81)
RDW: 13.2 % (ref 11.5–15.5)
WBC: 7.8 10*3/uL (ref 4.0–10.5)
nRBC: 0 % (ref 0.0–0.2)

## 2022-01-12 LAB — COMPREHENSIVE METABOLIC PANEL
ALT: 14 U/L (ref 0–44)
AST: 14 U/L — ABNORMAL LOW (ref 15–41)
Albumin: 3.7 g/dL (ref 3.5–5.0)
Alkaline Phosphatase: 1120 U/L — ABNORMAL HIGH (ref 38–126)
Anion gap: 6 (ref 5–15)
BUN: 26 mg/dL — ABNORMAL HIGH (ref 8–23)
CO2: 23 mmol/L (ref 22–32)
Calcium: 8.7 mg/dL — ABNORMAL LOW (ref 8.9–10.3)
Chloride: 104 mmol/L (ref 98–111)
Creatinine, Ser: 1.06 mg/dL (ref 0.61–1.24)
GFR, Estimated: >60 mL/min (ref >60)
Glucose, Bld: 110 mg/dL — ABNORMAL HIGH (ref 70–99)
Potassium: 4.1 mmol/L (ref 3.5–5.1)
Sodium: 133 mmol/L — ABNORMAL LOW (ref 135–145)
Total Bilirubin: 0.4 mg/dL (ref 0.3–1.2)
Total Protein: 6.9 g/dL (ref 6.5–8.1)

## 2022-01-12 LAB — PSA: Prostatic Specific Antigen: 105.8 ng/mL — ABNORMAL HIGH (ref 0.00–4.00)

## 2022-01-12 MED ORDER — SODIUM CHLORIDE 0.9 % IV SOLN
75.0000 mg/m2 | Freq: Once | INTRAVENOUS | Status: AC
  Administered 2022-01-12: 130 mg via INTRAVENOUS
  Filled 2022-01-12: qty 13

## 2022-01-12 MED ORDER — SODIUM CHLORIDE 0.9 % IV SOLN
Freq: Once | INTRAVENOUS | Status: AC
  Filled 2022-01-12: qty 250

## 2022-01-12 MED ORDER — DEGARELIX ACETATE 80 MG ~~LOC~~ SOLR
80.0000 mg | Freq: Once | SUBCUTANEOUS | Status: AC
  Administered 2022-01-12: 80 mg via SUBCUTANEOUS
  Filled 2022-01-12: qty 4

## 2022-01-12 MED ORDER — SODIUM CHLORIDE 0.9 % IV SOLN
10.0000 mg | Freq: Once | INTRAVENOUS | Status: AC
  Administered 2022-01-12: 10 mg via INTRAVENOUS
  Filled 2022-01-12: qty 10

## 2022-01-12 MED ORDER — HEPARIN SOD (PORK) LOCK FLUSH 100 UNIT/ML IV SOLN
500.0000 [IU] | Freq: Once | INTRAVENOUS | Status: AC | PRN
  Administered 2022-01-12: 500 [IU]
  Filled 2022-01-12: qty 5

## 2022-01-12 MED ORDER — LORAZEPAM 2 MG/ML IJ SOLN
1.0000 mg | Freq: Once | INTRAMUSCULAR | Status: AC | PRN
  Administered 2022-01-12: 1 mg via INTRAVENOUS
  Filled 2022-01-12: qty 1

## 2022-01-12 NOTE — Telephone Encounter (Signed)
Oral Oncology Patient Advocate Encounter ?  ?Received notification from [facility] that prior authorization for Nubeqa is required. ?  ?PA submitted on CoverMyMeds ?Key [id number] ?Status is pending ?  ?Oral Oncology Clinic will continue to follow. ? ?Dennison Nancy CPHT ?Specialty Pharmacy Patient Advocate ?[place] ?Phone [phone] ?Fax [phone] ?01/12/2022 4:13 PM ? ? ?

## 2022-01-12 NOTE — Patient Instructions (Signed)
[facility]  Discharge Instructions: ?Thank you for choosing [facility] to provide your oncology and hematology care.  ?If you have a lab appointment with the Cancer Center, please go directly to the Cancer Center and check in at the registration area. ? ?Wear comfortable clothing and clothing appropriate for easy access to any Portacath or PICC line.  ? ?We strive to give you quality time with your provider. You may need to reschedule your appointment if you arrive late (15 or more minutes).  Arriving late affects you and other patients whose appointments are after yours.  Also, if you miss three or more appointments without notifying the office, you may be dismissed from the clinic at the provider?s discretion.    ?  ?For prescription refill requests, have your pharmacy contact our office and allow 72 hours for refills to be completed.   ? ?  ?To help prevent nausea and vomiting after your treatment, we encourage you to take your nausea medication as directed. ? ?BELOW ARE SYMPTOMS THAT SHOULD BE REPORTED IMMEDIATELY: ?*FEVER GREATER THAN 100.4 F (38 ?C) OR HIGHER ?*CHILLS OR SWEATING ?*NAUSEA AND VOMITING THAT IS NOT CONTROLLED WITH YOUR NAUSEA MEDICATION ?*UNUSUAL SHORTNESS OF BREATH ?*UNUSUAL BRUISING OR BLEEDING ?*URINARY PROBLEMS (pain or burning when urinating, or frequent urination) ?*BOWEL PROBLEMS (unusual diarrhea, constipation, pain near the anus) ?TENDERNESS IN MOUTH AND THROAT WITH OR WITHOUT PRESENCE OF ULCERS (sore throat, sores in mouth, or a toothache) ?UNUSUAL RASH, SWELLING OR PAIN  ?UNUSUAL VAGINAL DISCHARGE OR ITCHING  ? ?Items with * indicate a potential emergency and should be followed up as soon as possible or go to the Emergency Department if any problems should occur. ? ?Please show the CHEMOTHERAPY ALERT CARD or IMMUNOTHERAPY ALERT CARD at check-in to the Emergency Department and triage nurse. ? ?Should you have questions after your visit  or need to cancel or reschedule your appointment, please contact [facility]  [phone] and follow the prompts.  Office hours are 8:00 a.m. to 4:30 p.m. Monday - Friday. Please note that voicemails left after 4:00 p.m. may not be returned until the following business day.  We are closed weekends and major holidays. You have access to a nurse at all times for urgent questions. Please call the main number to the clinic [phone] and follow the prompts. ? ?For any non-urgent questions, you may also contact your provider using MyChart. We now offer e-Visits for anyone 18 and older to request care online for non-urgent symptoms. For details visit [email or web address]. ?  ?Also download the MyChart app! Go to the app store, search "MyChart", open the app, select [facility], and log in with your MyChart username and password. ? ?Due to Covid, a mask is required upon entering the hospital/clinic. If you do not have a mask, one will be given to you upon arrival. For doctor visits, patients may have 1 support person aged 18 or older with them. For treatment visits, patients cannot have anyone with them due to current Covid guidelines and our immunocompromised population.  ?

## 2022-01-12 NOTE — Progress Notes (Signed)
0815 port accessed for labs, when putting dressing on port site pt became clammy, and diaphoretic, sats 100% on RA, bp 126/78, hr 77 rr 20, pt easily arouses and responds appropriately to commands, oriented to time and place, pt states he gets nervous and saw his blood (in the tubes). Dr Tasia Catchings over to see pt at 0823, orange juice given to drink since pt states he has not had anything to eat today, he was unsure if he could eat prior to tx, pt placed in wc when stable, pt to see NP this am prior to tx ?

## 2022-01-12 NOTE — Progress Notes (Signed)
All labs reviewed by MD. Madaline Brilliant to proceed with treatment today per Dr. Janese Banks. ? ? ?First time Taxotere. Titrated per policy. Patient tolerated well. Ambulatory at discharge. ?

## 2022-01-12 NOTE — Progress Notes (Signed)
[place] ?OFFICE PROGRESS NOTE ? ?Patient Care Team: ?Steele Sizer, MD as PCP - General (Family Medicine) ? ? Cancer Staging  ?Prostate cancer ([place]) ?Staging form: Prostate, AJCC 8th Edition ?- Clinical: Stage IVB (cT2c, cN1, cM1, PSA: 374, Grade Group: 5) - Signed by Cammie Sickle, MD on 12/13/2021 ?Prostate specific antigen (PSA) range: 20 or greater ?Histologic grading system: 5 grade system ? ?Oncology History Overview Note  ?compatible with widespread metastatic prostate ?cancer, including innumerable sclerotic osseous lesions and ?extensive lymphadenopathy in the pelvis and retroperitoneum, as ?detailed above. This includes lymphadenopathy in the upper right ?hemipelvis which likely causes severe stenosis or complete occlusion ?of the right common iliac vein. ?2. There is also an indeterminate hypovascular lesion in segment 5 ?of the liver which may represent a metastatic lesion. This could be ?better characterized with follow-up abdominal MRI with and without ?IV gadolinium if clinically appropriate. ?3. Aortic atherosclerosis with mild fusiform aneurysmal dilatation ?of the infrarenal abdominal aorta measuring up to 3.2 x 3.0 cm. ?Recommend follow-up ultrasound every 3 years. This recommendation ?follows ACR consensus guidelines: White Paper of the ACR Incidental ?Findings Committee II on Vascular Findings. J Am Coll Radiol 2013; ?54:650-354. ?4. Bladder wall appears mildly thickened, likely related to bladder ?outlet obstruction given the enlarged prostate gland. ?5. Severe colonic diverticulosis without evidence of acute ?diverticulitis at this time. ? ?IMPRESSION: ?There are numerous foci of abnormal tracer uptake in the axial and ?proximal appendicular skeleton consistent with extensive skeletal ?metastatic disease. ?  ? ? ?#Stage IV castrate sensitive prostate cancer-December/20 2023-MRI negative for any liver lesions.; FEB 24th, 2022-Firmagon loading dose; ? ?# march  24th-Taxoeter with udenyca ? ?# DEC 2022- 656 [[facility]; Urology] ?  ?Prostate cancer ([place])  ?12/13/2021 Initial Diagnosis  ? Prostate cancer [facility]) ?  ?12/13/2021 Cancer Staging  ? Staging form: Prostate, AJCC 8th Edition ?- Clinical: Stage IVB (cT2c, cN1, cM1, PSA: 374, Grade Group: 5) - Signed by Cammie Sickle, MD on 12/13/2021 ?Prostate specific antigen (PSA) range: 20 or greater ?Histologic grading system: 5 grade system ?  ?01/12/2022 -  Chemotherapy  ? Patient is on Treatment Plan : PROSTATE Docetaxel + Prednisone q21d  ?   ? ?  ?HISTORY OF PRESENT ILLNESS: Ambulating independently [? cane]; with daughter.  ? ?Eric [name] 81 y.o.  male pleasant patient above history of castrate sensitive metastatic prostate cancer is here for evaluation and consideration of initiation of docetaxel.  Patient has not taken Decadron as premed due to confusion about medications.  He has had Mediport placed.  Experienced syncopal episode with blood drawn port accessed this morning.  Denies hot flashes.  Denies pain.  Urinary symptoms have slightly improved.  No hematuria.  Chest wall pain is stable and controlled with Tylenol.  No intermittent falls.  His daughter accompanies him to visit today.  She herself is a cancer survivor. ? ?Review of Systems  ?Constitutional:  Positive for malaise/fatigue and weight loss. Negative for chills, diaphoresis and fever.  ?HENT:  Negative for nosebleeds and sore throat.   ?Eyes:  Negative for double vision.  ?Respiratory:  Negative for cough, hemoptysis, sputum production, shortness of breath and wheezing.   ?Cardiovascular:  Negative for chest pain, palpitations, orthopnea and leg swelling.  ?Gastrointestinal:  Negative for abdominal pain, blood in stool, constipation, diarrhea, heartburn, melena, nausea and vomiting.  ?Genitourinary:  Negative for dysuria, frequency and urgency.  ?Musculoskeletal:  Positive for back pain and joint pain.  ?Skin: Negative.  Negative for  itching and  rash.  ?Neurological:  Negative for dizziness, tingling, focal weakness, weakness and headaches.  ?Endo/Heme/Allergies:  Does not bruise/bleed easily.  ?Psychiatric/Behavioral:  Negative for depression. The patient is not nervous/anxious and does not have insomnia.   ?  ? ?PAST MEDICAL HISTORY :  ?Past Medical History:  ?Diagnosis Date  ? COPD (chronic obstructive pulmonary disease) ([place])   ? Dyspnea   ? with exertion  ? GERD (gastroesophageal reflux disease)   ? occ  ? Hyperlipidemia   ? Hypertension   ? ? ?PAST SURGICAL HISTORY :   ?Past Surgical History:  ?Procedure Laterality Date  ? COLONOSCOPY    ? HEMORROIDECTOMY    ? IR IMAGING GUIDED PORT INSERTION  12/20/2021  ? PROSTATE BIOPSY N/A 10/26/2021  ? Procedure: PROSTATE BIOPSY;  Surgeon: Abbie Sons, MD;  Location: [facility] ORS;  Service: Urology;  Laterality: N/A;  ? TRANSRECTAL ULTRASOUND N/A 10/26/2021  ? Procedure: TRANSRECTAL ULTRASOUND;  Surgeon: Abbie Sons, MD;  Location: [facility] ORS;  Service: Urology;  Laterality: N/A;  ? ? ?FAMILY HISTORY :   ?Family History  ?Problem Relation Age of Onset  ? Heart disease Mother   ? COPD Father   ? ? ?SOCIAL HISTORY:   ?Social History  ? ?Tobacco Use  ? Smoking status: Every Day  ?  Packs/day: 0.50  ?  Years: 60.00  ?  Pack years: 30.00  ?  Types: Cigarettes  ? Smokeless tobacco: Never  ? Tobacco comments:  ?  smoking cessation information provided  ?Vaping Use  ? Vaping Use: Never used  ?Substance Use Topics  ? Alcohol use: Not Currently  ? Drug use: No  ? ? ?ALLERGIES:  has No Known Allergies. ? ?MEDICATIONS:  ?Current Outpatient Medications  ?Medication Sig Dispense Refill  ? acetaminophen (TYLENOL) 500 MG tablet Take 1 tablet (500 mg total) by mouth every 6 (six) hours as needed. 30 tablet 0  ? Ca Carbonate-Mag Hydroxide (ROLAIDS PO) Take 1 tablet by mouth as needed.    ? dexamethasone (DECADRON) 4 MG tablet Take one pill AM & PM x 2 days-TAKE the day prior to chemo; and the day after chemo; do not take on the day  of chemo. 60 tablet 0  ? ezetimibe (ZETIA) 10 MG tablet Take 1 tablet (10 mg total) by mouth daily. (Patient taking differently: Take 10 mg by mouth every morning.) 90 tablet 1  ? Ipratropium-Albuterol (COMBIVENT) 20-100 MCG/ACT AERS respimat Inhale 1 puff into the lungs every 6 (six) hours. (Patient not taking: Reported on 12/13/2021) 4 g 1  ? lidocaine-prilocaine (EMLA) cream Apply 1 application. topically as needed. 30 g 2  ? lisinopril (ZESTRIL) 5 MG tablet Take 1 tablet (5 mg total) by mouth daily. For bp and kidney protection (Patient taking differently: Take 5 mg by mouth every morning. For bp and kidney protection) 90 tablet 1  ? meclizine (ANTIVERT) 25 MG tablet TAKE 1 TABLET (25 MG TOTAL) BY MOUTH DAILY AS NEEDED FOR DIZZINESS. (Patient not taking: Reported on 12/13/2021) 30 tablet 0  ? metoprolol succinate (TOPROL-XL) 25 MG 24 hr tablet Take 1 tablet (25 mg total) by mouth daily. Take with or immediately following a meal. (Patient taking differently: Take 25 mg by mouth every morning. Take with or immediately following a meal.) 90 tablet 1  ? ondansetron (ZOFRAN) 8 MG tablet One pill every 8 hours as needed for nausea/vomitting. 40 tablet 1  ? prochlorperazine (COMPAZINE) 10 MG tablet Take 1 tablet (10 mg total)  by mouth every 6 (six) hours as needed for nausea or vomiting. 40 tablet 1  ? rosuvastatin (CRESTOR) 40 MG tablet Take 1 tablet (40 mg total) by mouth daily. (Patient taking differently: Take 40 mg by mouth every morning.) 90 tablet 1  ? tamsulosin (FLOMAX) 0.4 MG CAPS capsule Take 1 capsule (0.4 mg total) by mouth daily after supper. 30 capsule 3  ? ?No current facility-administered medications for this visit.  ? ? ?PHYSICAL EXAMINATION: ?ECOG PERFORMANCE STATUS: 1 - Symptomatic but completely ambulatory ? ?BP 124/61   Pulse 73   Temp 98.7 ?F (37.1 ?C)   Resp 19   Wt 149 lb 1.6 oz (67.6 kg)   SpO2 97%   BMI 24.07 kg/m?  ? Danley Danker Weights  ? 01/12/22 0839  ?Weight: 149 lb 1.6 oz (67.6 kg)   ? ? ?Physical Exam ?Vitals and nursing note reviewed.  ?HENT:  ?   Head: Normocephalic and atraumatic.  ?   Mouth/Throat:  ?   Pharynx: Oropharynx is clear.  ?Eyes:  ?   Extraocular Movements: Extraocular movements intact

## 2022-01-12 NOTE — Telephone Encounter (Signed)
Oral Oncology Patient Advocate Encounter ? ?Prior Authorization for [place] has been approved.   ? ?PA# [id number] ?Effective dates: 01/12/22 through 07/11/22 ? ?Patients co-pay is [phone]. ? ?Oral Oncology Clinic will continue to follow.  ? ?Dennison Nancy CPHT ?Specialty Pharmacy Patient Advocate ?[place] ?Phone [phone] ?Fax [phone] ?01/12/2022 4:14 PM ? ?

## 2022-01-12 NOTE — Telephone Encounter (Signed)
Oral Oncology Pharmacist Encounter ? ?Received new prescription for Nubeqa (darolutamide) for the treatment of metastatic castration sensitive prostate cancer in conjunction with docetaxel and ADT, planned duration patient will continue darolutamide until disease progression or unacceptable drug toxicity. Planned start 02/02/22 along with cycle 2 of darolutamide. ? ?CMP from 01/12/22 assessed, no relevant lab abnormalities.  ? ?Current medication list in Epic reviewed, a few DDIs with darolutamide identified: ?Rosuvastatin: Darolutamide may increase the serum concentration of Rosuvastatin. Limit the dose of rosuvastatin to 5 mg daily when combined with darolutamide. Monitor closely for increased rosuvastatin effects/toxicities (eg, myalgias, rhabdomyolysis) when these agents are combined. ?Ezetimibe: Darolutamide may increase the serum concentration of ezetimibe. Monitor patients for increased toxicities and effects of ezetimibe.  ? ?Evaluated chart and no patient barriers to medication adherence identified.  ? ?Oral Oncology Clinic will continue to follow for insurance authorization, copayment issues, initial counseling and start date. ? ? ?Darl Pikes, PharmD, BCPS, BCOP, CPP ?Hematology/Oncology Clinical Pharmacist Practitioner ?[place]/[facility] Oral Chemotherapy Navigation Clinic ?[phone] ? ?01/12/2022 1:53 PM ? ?

## 2022-01-13 ENCOUNTER — Encounter: Payer: Self-pay | Admitting: Licensed Clinical Social Worker

## 2022-01-13 ENCOUNTER — Inpatient Hospital Stay: Payer: Medicare HMO

## 2022-01-13 DIAGNOSIS — C7951 Secondary malignant neoplasm of bone: Secondary | ICD-10-CM | POA: Diagnosis not present

## 2022-01-13 DIAGNOSIS — Z5111 Encounter for antineoplastic chemotherapy: Secondary | ICD-10-CM | POA: Diagnosis not present

## 2022-01-13 DIAGNOSIS — R110 Nausea: Secondary | ICD-10-CM | POA: Diagnosis not present

## 2022-01-13 DIAGNOSIS — T451X5A Adverse effect of antineoplastic and immunosuppressive drugs, initial encounter: Secondary | ICD-10-CM | POA: Diagnosis not present

## 2022-01-13 DIAGNOSIS — R112 Nausea with vomiting, unspecified: Secondary | ICD-10-CM | POA: Diagnosis not present

## 2022-01-13 DIAGNOSIS — C61 Malignant neoplasm of prostate: Secondary | ICD-10-CM

## 2022-01-13 DIAGNOSIS — K649 Unspecified hemorrhoids: Secondary | ICD-10-CM | POA: Diagnosis not present

## 2022-01-13 DIAGNOSIS — K1230 Oral mucositis (ulcerative), unspecified: Secondary | ICD-10-CM | POA: Diagnosis not present

## 2022-01-13 DIAGNOSIS — K121 Other forms of stomatitis: Secondary | ICD-10-CM | POA: Diagnosis not present

## 2022-01-13 MED ORDER — PEGFILGRASTIM-CBQV 6 MG/0.6ML ~~LOC~~ SOSY
6.0000 mg | PREFILLED_SYRINGE | Freq: Once | SUBCUTANEOUS | Status: AC
  Administered 2022-01-13: 6 mg via SUBCUTANEOUS
  Filled 2022-01-13: qty 0.6

## 2022-01-14 ENCOUNTER — Encounter: Payer: Self-pay | Admitting: Internal Medicine

## 2022-01-14 ENCOUNTER — Encounter: Payer: Self-pay | Admitting: Licensed Clinical Social Worker

## 2022-01-14 ENCOUNTER — Telehealth: Payer: Self-pay | Admitting: Pharmacy Technician

## 2022-01-14 NOTE — Progress Notes (Signed)
[place] Clinical Social Work  ?Initial Assessment ? ? ?Eric [name] is a 81 y.o. year old male contacted by phone. Clinical Social Work was referred by medical provider for assessment of psychosocial needs.  ? ?SDOH (Social Determinants of Health) assessments performed: Yes ?SDOH Interventions   ? ?Flowsheet Row Most Recent Value  ?SDOH Interventions   ?Food Insecurity Interventions Intervention Not Indicated  ?Financial Strain Interventions Intervention Not Indicated  ?Housing Interventions Intervention Not Indicated  ?Intimate Partner Violence Interventions Intervention Not Indicated  ?Physical Activity Interventions Intervention Not Indicated  ?Stress Interventions Intervention Not Indicated  ?Social Connections Interventions Intervention Not Indicated  ?Transportation Interventions Intervention Not Indicated  ? ?  ?  ?Distress Screen completed: No ? ?  12/13/2021  ?  1:48 PM  ?ONCBCN DISTRESS SCREENING  ?Screening Type Initial Screening  ?Distress experienced in past week (1-10) 0  ? ? ? ? ?Family/Social Information:  ?Housing Arrangement: patient lives alone, daughter [name] [phone] main support and caregiver. ?Family members/support persons in your life? Family, Social research officer, government, and Community  ?Transportation concerns: no  ?Employment: Retired. Income source: [place] ?Financial concerns: No ?Type of concern: None ?Food access concerns: no ?Religious or spiritual practice: no ?Services Currently in place:  [place] ? ?Coping/ Adjustment to diagnosis: ?Patient understands treatment plan and what happens next? yes ?Concerns about diagnosis and/or treatment: I'm not especially worried about anything ?Patient reported stressors: Adjusting to my illness ?Hopes and priorities: N/A ?Patient enjoys time with family/ friends ?Current coping skills/ strengths: Capable of independent living , Communication skills , Motivation for treatment/growth , and Supportive family/friends  ? ? ?  SUMMARY: ?Current SDOH Barriers:  ?Financial constraints related to fixed income and Limited social support ? ?Clinical Social Work Clinical Goal(s):  ?N/A ? ?Interventions: ?Discussed common feeling and emotions when being diagnosed with cancer, and the importance of support during treatment ?Informed patient of the support team roles and support services at [facility] ?Provided CSW contact information and encouraged patient to call with any questions or concerns ?Provided patient with information about CSW role in patient care and available resources.  ? ? ?Follow Up Plan: Patient will contact CSW with any support or resource needs ?Patient verbalizes understanding of plan: Yes ? ? ? ?Ainsley Sanguinetti, LCSW ?

## 2022-01-17 ENCOUNTER — Telehealth: Payer: Self-pay | Admitting: *Deleted

## 2022-01-17 NOTE — Telephone Encounter (Signed)
Daughter called reporting that patient girlfriend called her to report that patient is passing more blood than usual. She states she heard the conversation and he shushed his girlfriend. She then spoke with her father who admitted that he is in fact passing more blood than he normally does. Please advise. ?

## 2022-01-17 NOTE — Telephone Encounter (Signed)
Called pt to obtain further information, as requested by daughter. Per pt, he observes a small amount of blood when he wipes after a BM. He denies any bloody stools or clots. Pt was offered appointment to be seen sooner than 3/29; however, pt declined and stated that he would keep appointments as scheduled on 3/29. Reviewed ER precautions.  ?

## 2022-01-17 NOTE — Telephone Encounter (Signed)
Oral Oncology Patient Advocate Encounter ? ?Alyson met patient during visit and had him sign and complete application for [employer] [place] [employer] in an effort to reduce patient's out of pocket expense for Nubeqa to $0.   ? ?Application completed and faxed to [phone] on 01/19/22.  ? ?[employer] [place] patient assistance phone number for follow up is [phone].  ? ?This encounter will be updated until final determination.  ? ?Dennison Nancy CPHT ?Specialty Pharmacy Patient Advocate ?[place] ?Phone [phone] ?Fax [phone] ?01/21/2022 10:43 AM ? ? ?

## 2022-01-18 ENCOUNTER — Telehealth: Payer: Self-pay | Admitting: *Deleted

## 2022-01-18 NOTE — Telephone Encounter (Signed)
Daughter called stating that patient is still not doing well, he is not eating, his bottom lip is swollen and she wanted to know what he said when called yesterday. I read the note from B [place] yesterday and told her that he refused an appointment, She said she knew she should have called him yesterday to find out. She was asking if he needed to come in to be seen today, I told her that it is too late in the day to coem today, but if she felt he needed to be seen today, she should take him to the ER. She asked if she needed to do that. I asked if he is in any distress, shortness of breath, difficulty breathing, swallowing etc. She states that he is not in distress. I told her that he should be ok until tomorrow morning, but if worsens tonight to take him to ER. She agreed and said he will be there tomorrow ?

## 2022-01-19 ENCOUNTER — Other Ambulatory Visit: Payer: Self-pay

## 2022-01-19 ENCOUNTER — Inpatient Hospital Stay: Payer: Medicare HMO

## 2022-01-19 ENCOUNTER — Encounter: Payer: Self-pay | Admitting: Nurse Practitioner

## 2022-01-19 ENCOUNTER — Inpatient Hospital Stay (HOSPITAL_BASED_OUTPATIENT_CLINIC_OR_DEPARTMENT_OTHER): Payer: Medicare HMO | Admitting: Nurse Practitioner

## 2022-01-19 DIAGNOSIS — K521 Toxic gastroenteritis and colitis: Secondary | ICD-10-CM | POA: Diagnosis not present

## 2022-01-19 DIAGNOSIS — E860 Dehydration: Secondary | ICD-10-CM

## 2022-01-19 DIAGNOSIS — R112 Nausea with vomiting, unspecified: Secondary | ICD-10-CM | POA: Diagnosis not present

## 2022-01-19 DIAGNOSIS — Z5111 Encounter for antineoplastic chemotherapy: Secondary | ICD-10-CM | POA: Diagnosis not present

## 2022-01-19 DIAGNOSIS — I10 Essential (primary) hypertension: Secondary | ICD-10-CM

## 2022-01-19 DIAGNOSIS — T451X5A Adverse effect of antineoplastic and immunosuppressive drugs, initial encounter: Secondary | ICD-10-CM

## 2022-01-19 DIAGNOSIS — K649 Unspecified hemorrhoids: Secondary | ICD-10-CM | POA: Diagnosis not present

## 2022-01-19 DIAGNOSIS — F17210 Nicotine dependence, cigarettes, uncomplicated: Secondary | ICD-10-CM

## 2022-01-19 DIAGNOSIS — C61 Malignant neoplasm of prostate: Secondary | ICD-10-CM | POA: Diagnosis not present

## 2022-01-19 DIAGNOSIS — K121 Other forms of stomatitis: Secondary | ICD-10-CM

## 2022-01-19 DIAGNOSIS — C7951 Secondary malignant neoplasm of bone: Secondary | ICD-10-CM | POA: Diagnosis not present

## 2022-01-19 DIAGNOSIS — K1230 Oral mucositis (ulcerative), unspecified: Secondary | ICD-10-CM

## 2022-01-19 DIAGNOSIS — R110 Nausea: Secondary | ICD-10-CM | POA: Diagnosis not present

## 2022-01-19 DIAGNOSIS — F419 Anxiety disorder, unspecified: Secondary | ICD-10-CM

## 2022-01-19 LAB — CBC WITH DIFFERENTIAL/PLATELET
Abs Immature Granulocytes: 6.41 10*3/uL — ABNORMAL HIGH (ref 0.00–0.07)
Basophils Absolute: 0 10*3/uL (ref 0.0–0.1)
Basophils Relative: 0 %
Eosinophils Absolute: 0 10*3/uL (ref 0.0–0.5)
Eosinophils Relative: 0 %
HCT: 35.5 % — ABNORMAL LOW (ref 39.0–52.0)
Hemoglobin: 11.7 g/dL — ABNORMAL LOW (ref 13.0–17.0)
Immature Granulocytes: 18 %
Lymphocytes Relative: 7 %
Lymphs Abs: 2.7 10*3/uL (ref 0.7–4.0)
MCH: 31.5 pg (ref 26.0–34.0)
MCHC: 33 g/dL (ref 30.0–36.0)
MCV: 95.4 fL (ref 80.0–100.0)
Monocytes Absolute: 3.8 10*3/uL — ABNORMAL HIGH (ref 0.1–1.0)
Monocytes Relative: 11 %
Neutro Abs: 23 10*3/uL — ABNORMAL HIGH (ref 1.7–7.7)
Neutrophils Relative %: 64 %
Platelets: 250 10*3/uL (ref 150–400)
RBC: 3.72 MIL/uL — ABNORMAL LOW (ref 4.22–5.81)
RDW: 13.5 % (ref 11.5–15.5)
Smear Review: NORMAL
WBC: 35.9 10*3/uL — ABNORMAL HIGH (ref 4.0–10.5)
nRBC: 0.2 % (ref 0.0–0.2)

## 2022-01-19 LAB — COMPREHENSIVE METABOLIC PANEL
ALT: 17 U/L (ref 0–44)
AST: 23 U/L (ref 15–41)
Albumin: 3.3 g/dL — ABNORMAL LOW (ref 3.5–5.0)
Alkaline Phosphatase: 610 U/L — ABNORMAL HIGH (ref 38–126)
Anion gap: 8 (ref 5–15)
BUN: 25 mg/dL — ABNORMAL HIGH (ref 8–23)
CO2: 22 mmol/L (ref 22–32)
Calcium: 8.5 mg/dL — ABNORMAL LOW (ref 8.9–10.3)
Chloride: 101 mmol/L (ref 98–111)
Creatinine, Ser: 1.65 mg/dL — ABNORMAL HIGH (ref 0.61–1.24)
GFR, Estimated: 42 mL/min — ABNORMAL LOW (ref >60)
Glucose, Bld: 120 mg/dL — ABNORMAL HIGH (ref 70–99)
Potassium: 4.3 mmol/L (ref 3.5–5.1)
Sodium: 131 mmol/L — ABNORMAL LOW (ref 135–145)
Total Bilirubin: 0.5 mg/dL (ref 0.3–1.2)
Total Protein: 6.6 g/dL (ref 6.5–8.1)

## 2022-01-19 MED ORDER — SODIUM CHLORIDE 0.9 % IV SOLN
Freq: Once | INTRAVENOUS | Status: AC
  Filled 2022-01-19: qty 250

## 2022-01-19 MED ORDER — DEXAMETHASONE SODIUM PHOSPHATE 10 MG/ML IJ SOLN
10.0000 mg | Freq: Once | INTRAMUSCULAR | Status: AC
  Administered 2022-01-19: 10 mg via INTRAVENOUS
  Filled 2022-01-19: qty 1

## 2022-01-19 MED ORDER — HEPARIN SOD (PORK) LOCK FLUSH 100 UNIT/ML IV SOLN
500.0000 [IU] | Freq: Once | INTRAVENOUS | Status: AC
  Administered 2022-01-19: 500 [IU] via INTRAVENOUS
  Filled 2022-01-19: qty 5

## 2022-01-19 NOTE — Telephone Encounter (Signed)
Noted  

## 2022-01-19 NOTE — Progress Notes (Signed)
[place] ?OFFICE PROGRESS NOTE ? ?Patient Care Team: ?Steele Sizer, MD as PCP - General (Family Medicine) ? ? Cancer Staging  ?Prostate cancer ([place]) ?Staging form: Prostate, AJCC 8th Edition ?- Clinical: Stage IVB (cT2c, cN1, cM1, PSA: 374, Grade Group: 5) - Signed by Cammie Sickle, MD on 12/13/2021 ?Prostate specific antigen (PSA) range: 20 or greater ?Histologic grading system: 5 grade system ? ?Oncology History Overview Note  ?compatible with widespread metastatic prostate ?cancer, including innumerable sclerotic osseous lesions and ?extensive lymphadenopathy in the pelvis and retroperitoneum, as ?detailed above. This includes lymphadenopathy in the upper right ?hemipelvis which likely causes severe stenosis or complete occlusion ?of the right common iliac vein. ?2. There is also an indeterminate hypovascular lesion in segment 5 ?of the liver which may represent a metastatic lesion. This could be ?better characterized with follow-up abdominal MRI with and without ?IV gadolinium if clinically appropriate. ?3. Aortic atherosclerosis with mild fusiform aneurysmal dilatation ?of the infrarenal abdominal aorta measuring up to 3.2 x 3.0 cm. ?Recommend follow-up ultrasound every 3 years. This recommendation ?follows ACR consensus guidelines: White Paper of the ACR Incidental ?Findings Committee II on Vascular Findings. J Am Coll Radiol 2013; ?32:440-102. ?4. Bladder wall appears mildly thickened, likely related to bladder ?outlet obstruction given the enlarged prostate gland. ?5. Severe colonic diverticulosis without evidence of acute ?diverticulitis at this time. ? ?IMPRESSION: ?There are numerous foci of abnormal tracer uptake in the axial and ?proximal appendicular skeleton consistent with extensive skeletal ?metastatic disease. ?  ? ? ?#Stage IV castrate sensitive prostate cancer-December/20 2023-MRI negative for any liver lesions.; FEB 24th, 2022-Firmagon loading dose; ? ?# march  24th-Taxoeter with udenyca ? ?# DEC 2022- 725 [[facility]; Urology] ?  ?Prostate cancer ([place])  ?12/13/2021 Initial Diagnosis  ? Prostate cancer [facility]) ?  ?12/13/2021 Cancer Staging  ? Staging form: Prostate, AJCC 8th Edition ?- Clinical: Stage IVB (cT2c, cN1, cM1, PSA: 374, Grade Group: 5) - Signed by Cammie Sickle, MD on 12/13/2021 ?Prostate specific antigen (PSA) range: 20 or greater ?Histologic grading system: 5 grade system ?  ?01/12/2022 -  Chemotherapy  ? Patient is on Treatment Plan : PROSTATE Docetaxel + Prednisone q21d  ?   ? ?  ?HISTORY OF PRESENT ILLNESS: Ambulating independently [? cane]; with daughter, Rhondie. ? ?[name] 81 y.o.  male pleasant patient above history of castrate sensitive metastatic prostate cancer, s/p cycle 1 of docetaxel, with udenyca on Day 2, returns to clinic for follow up and evaluation after treatment. Complains of swollen lip, fatigue, malaise. Suffered nausea after chemotherapy. Did not initially take antiemetics but symptoms did once he started. Has not tried antidiarrheals but suffered diarrhea which exacerbated hemorrhoids.  ? ?Review of Systems  ?Constitutional:  Positive for malaise/fatigue and weight loss. Negative for chills, diaphoresis and fever.  ?HENT:  Negative for nosebleeds and sore throat.   ?     Mouth sores  ?Eyes:  Negative for double vision.  ?Respiratory:  Negative for cough, hemoptysis, sputum production, shortness of breath and wheezing.   ?Cardiovascular:  Negative for chest pain, palpitations, orthopnea and leg swelling.  ?Gastrointestinal:  Positive for diarrhea and nausea. Negative for abdominal pain, blood in stool, constipation, heartburn, melena and vomiting.  ?Genitourinary:  Negative for dysuria, frequency and urgency.  ?Musculoskeletal:  Positive for back pain and joint pain.  ?Skin: Negative.  Negative for itching and rash.  ?Neurological:  Positive for weakness. Negative for dizziness, tingling, focal weakness and headaches.   ?Endo/Heme/Allergies:  Does  not bruise/bleed easily.  ?Psychiatric/Behavioral:  Negative for depression. The patient is not nervous/anxious and does not have insomnia.   ?  ? ?PAST MEDICAL HISTORY :  ?Past Medical History:  ?Diagnosis Date  ? COPD (chronic obstructive pulmonary disease) ([place])   ? Dyspnea   ? with exertion  ? GERD (gastroesophageal reflux disease)   ? occ  ? Hyperlipidemia   ? Hypertension   ? ? ?PAST SURGICAL HISTORY :   ?Past Surgical History:  ?Procedure Laterality Date  ? COLONOSCOPY    ? HEMORROIDECTOMY    ? IR IMAGING GUIDED PORT INSERTION  12/20/2021  ? PROSTATE BIOPSY N/A 10/26/2021  ? Procedure: PROSTATE BIOPSY;  Surgeon: Abbie Sons, MD;  Location: [facility] ORS;  Service: Urology;  Laterality: N/A;  ? TRANSRECTAL ULTRASOUND N/A 10/26/2021  ? Procedure: TRANSRECTAL ULTRASOUND;  Surgeon: Abbie Sons, MD;  Location: [facility] ORS;  Service: Urology;  Laterality: N/A;  ? ? ?FAMILY HISTORY :   ?Family History  ?Problem Relation Age of Onset  ? Heart disease Mother   ? COPD Father   ? ? ?SOCIAL HISTORY:   ?Social History  ? ?Tobacco Use  ? Smoking status: Every Day  ?  Packs/day: 0.50  ?  Years: 60.00  ?  Pack years: 30.00  ?  Types: Cigarettes  ? Smokeless tobacco: Never  ? Tobacco comments:  ?  smoking cessation information provided  ?Vaping Use  ? Vaping Use: Never used  ?Substance Use Topics  ? Alcohol use: Not Currently  ? ? ?ALLERGIES:  has No Known Allergies. ? ?MEDICATIONS:  ?Current Outpatient Medications  ?Medication Sig Dispense Refill  ? acetaminophen (TYLENOL) 500 MG tablet Take 1 tablet (500 mg total) by mouth every 6 (six) hours as needed. 30 tablet 0  ? Ca Carbonate-Mag Hydroxide (ROLAIDS PO) Take 1 tablet by mouth as needed.    ? dexamethasone (DECADRON) 4 MG tablet Take one pill AM & PM x 2 days-TAKE the day prior to chemo; and the day after chemo; do not take on the day of chemo. 60 tablet 0  ? ezetimibe (ZETIA) 10 MG tablet Take 1 tablet (10 mg total) by mouth daily. (Patient  taking differently: Take 10 mg by mouth every morning.) 90 tablet 1  ? Ipratropium-Albuterol (COMBIVENT) 20-100 MCG/ACT AERS respimat Inhale 1 puff into the lungs every 6 (six) hours. 4 g 1  ? lidocaine-prilocaine (EMLA) cream Apply 1 application. topically as needed. 30 g 2  ? lisinopril (ZESTRIL) 5 MG tablet Take 1 tablet (5 mg total) by mouth daily. For bp and kidney protection 90 tablet 1  ? meclizine (ANTIVERT) 25 MG tablet TAKE 1 TABLET (25 MG TOTAL) BY MOUTH DAILY AS NEEDED FOR DIZZINESS. 30 tablet 0  ? metoprolol succinate (TOPROL-XL) 25 MG 24 hr tablet Take 1 tablet (25 mg total) by mouth daily. Take with or immediately following a meal. (Patient taking differently: Take 25 mg by mouth every morning. Take with or immediately following a meal.) 90 tablet 1  ? ondansetron (ZOFRAN) 8 MG tablet One pill every 8 hours as needed for nausea/vomitting. 40 tablet 1  ? prochlorperazine (COMPAZINE) 10 MG tablet Take 1 tablet (10 mg total) by mouth every 6 (six) hours as needed for nausea or vomiting. 40 tablet 1  ? rosuvastatin (CRESTOR) 40 MG tablet Take 1 tablet (40 mg total) by mouth daily. (Patient taking differently: Take 40 mg by mouth every morning.) 90 tablet 1  ? tamsulosin (FLOMAX) 0.4 MG CAPS capsule  Take 1 capsule (0.4 mg total) by mouth daily after supper. 30 capsule 3  ? ?No current facility-administered medications for this visit.  ? ? ?PHYSICAL EXAMINATION: ?ECOG PERFORMANCE STATUS: 1 - Symptomatic but completely ambulatory ? ?There were no vitals taken for this visit. ? ?There were no vitals filed for this visit. ? ? ?Physical Exam ?Constitutional:   ?   Appearance: He is not ill-appearing.  ?   Comments: Frail appearing. In recliner. Daughter at chairside  ?HENT:  ?   Mouth/Throat:  ?   Comments: Stomatitis on buccal surfaces. tongue reddened but not ulcerated. ?Eyes:  ?   General: No scleral icterus. ?   Conjunctiva/sclera: Conjunctivae normal.  ?Cardiovascular:  ?   Rate and Rhythm: Normal rate and  regular rhythm.  ?Pulmonary:  ?   Effort: No respiratory distress.  ?Abdominal:  ?   General: There is no distension.  ?   Palpations: Abdomen is soft.  ?   Tenderness: There is no abdominal tenderness. There is no guar

## 2022-01-19 NOTE — Progress Notes (Signed)
Lips/tongue swollen x2 days. Feeling weak and tired x1 day. ? ? ?

## 2022-01-19 NOTE — Progress Notes (Signed)
Received IV hydration + dexamethasone. Magic mouthwash prescription faxed into [facility]. Pt did eat peanut butter crackers while in clinic.Port needle deaccessed. Discharged to home. ?

## 2022-01-20 ENCOUNTER — Encounter: Payer: Self-pay | Admitting: Internal Medicine

## 2022-01-21 ENCOUNTER — Inpatient Hospital Stay: Payer: Medicare HMO

## 2022-01-21 ENCOUNTER — Telehealth: Payer: Self-pay | Admitting: *Deleted

## 2022-01-21 DIAGNOSIS — R112 Nausea with vomiting, unspecified: Secondary | ICD-10-CM | POA: Diagnosis not present

## 2022-01-21 DIAGNOSIS — C61 Malignant neoplasm of prostate: Secondary | ICD-10-CM | POA: Diagnosis not present

## 2022-01-21 DIAGNOSIS — E860 Dehydration: Secondary | ICD-10-CM

## 2022-01-21 DIAGNOSIS — C7951 Secondary malignant neoplasm of bone: Secondary | ICD-10-CM | POA: Diagnosis not present

## 2022-01-21 DIAGNOSIS — R110 Nausea: Secondary | ICD-10-CM | POA: Diagnosis not present

## 2022-01-21 DIAGNOSIS — K1230 Oral mucositis (ulcerative), unspecified: Secondary | ICD-10-CM | POA: Diagnosis not present

## 2022-01-21 DIAGNOSIS — K649 Unspecified hemorrhoids: Secondary | ICD-10-CM | POA: Diagnosis not present

## 2022-01-21 DIAGNOSIS — K121 Other forms of stomatitis: Secondary | ICD-10-CM | POA: Diagnosis not present

## 2022-01-21 DIAGNOSIS — Z5111 Encounter for antineoplastic chemotherapy: Secondary | ICD-10-CM | POA: Diagnosis not present

## 2022-01-21 DIAGNOSIS — T451X5A Adverse effect of antineoplastic and immunosuppressive drugs, initial encounter: Secondary | ICD-10-CM | POA: Diagnosis not present

## 2022-01-21 LAB — BASIC METABOLIC PANEL
Anion gap: 7 (ref 5–15)
BUN: 21 mg/dL (ref 8–23)
CO2: 23 mmol/L (ref 22–32)
Calcium: 8.6 mg/dL — ABNORMAL LOW (ref 8.9–10.3)
Chloride: 103 mmol/L (ref 98–111)
Creatinine, Ser: 1.03 mg/dL (ref 0.61–1.24)
GFR, Estimated: >60 mL/min (ref >60)
Glucose, Bld: 186 mg/dL — ABNORMAL HIGH (ref 70–99)
Potassium: 4 mmol/L (ref 3.5–5.1)
Sodium: 133 mmol/L — ABNORMAL LOW (ref 135–145)

## 2022-01-21 MED ORDER — SODIUM CHLORIDE 0.9 % IV SOLN
Freq: Once | INTRAVENOUS | Status: AC
  Filled 2022-01-21: qty 250

## 2022-01-21 MED ORDER — DEXAMETHASONE SODIUM PHOSPHATE 10 MG/ML IJ SOLN
10.0000 mg | Freq: Once | INTRAMUSCULAR | Status: AC
  Administered 2022-01-21: 10 mg via INTRAVENOUS

## 2022-01-21 MED ORDER — SODIUM CHLORIDE 0.9% FLUSH
10.0000 mL | Freq: Once | INTRAVENOUS | Status: AC
  Administered 2022-01-21: 10 mL via INTRAVENOUS
  Filled 2022-01-21: qty 10

## 2022-01-21 MED ORDER — HEPARIN SOD (PORK) LOCK FLUSH 100 UNIT/ML IV SOLN
500.0000 [IU] | Freq: Once | INTRAVENOUS | Status: AC
  Administered 2022-01-21: 500 [IU] via INTRAVENOUS
  Filled 2022-01-21: qty 5

## 2022-01-21 NOTE — Telephone Encounter (Signed)
Patient received free trial of Nubeqa and brought to fluid appt. Judeen Hammans, RN, informed patient to not start medication until cycle 2 of docetaxel. ?

## 2022-01-21 NOTE — Telephone Encounter (Signed)
Daughter and pt has questions about pt bought his nubeqa tabs that were just delivered to his house. Doesn't know if he needs to start them or how to take it. Also someone gave him Blackseed oil to try. Not sure if that is safe for him to take. He is here getting IVFs today. ? ? ?I checked with Janese Banks since Dr. B is out of country and he is to start the [place] on 4/12. That is the same day of his treatment. He can bring the pill with him and make sure that his counts are good to start chemo and the Nubequa pill. Also they asked about black seed oil- Lauren Zenia Resides looked the meds up and there was no interaction with other meds , however the black seed oil does cause b/p drops and so therefore he should not take the oil. Patient and daughter aware of this ?

## 2022-01-21 NOTE — Progress Notes (Signed)
Pt feeling better. Pt brought his new oral chemotherapy pills into clinic today, wondering when he is supposed to start taking them. Spoke w/ Teressa Lower. Pt was given directions to bring the bottle with him on 02/02/22 to see MD and will potentially start medication on that day.Received IVF + dexamethasone today. Feels hungry picking up a hamburger on way home. Discharged to home. ?

## 2022-01-25 NOTE — Telephone Encounter (Signed)
Called to check status of application.  Application is still in process. Rep stated that there is no turn around time, but I could call back on Friday to see if there has been any update. ? ?Dennison Nancy CPHT ?Specialty Pharmacy Patient Advocate ?[place] ?Phone [phone] ?Fax [phone] ?01/25/2022 10:42 AM ? ?

## 2022-02-01 ENCOUNTER — Other Ambulatory Visit: Payer: Self-pay

## 2022-02-01 DIAGNOSIS — C61 Malignant neoplasm of prostate: Secondary | ICD-10-CM

## 2022-02-01 MED FILL — Dexamethasone Sodium Phosphate Inj 100 MG/10ML: INTRAMUSCULAR | Qty: 1 | Status: AC

## 2022-02-01 NOTE — Telephone Encounter (Signed)
Oral Oncology Patient Advocate Encounter ? ?Called to check the status of patients application.  Rep stated patient was approved on 01/26/22 until 10/23/22.  Patients first shipment was delivered on 01/28/22. ? ?Dennison Nancy CPHT ?Specialty Pharmacy Patient Advocate ?[place] ?Phone [phone] ?Fax [phone] ?02/01/2022 9:37 AM ? ?

## 2022-02-02 ENCOUNTER — Encounter: Payer: Self-pay | Admitting: Licensed Clinical Social Worker

## 2022-02-02 ENCOUNTER — Inpatient Hospital Stay: Payer: Medicare HMO

## 2022-02-02 ENCOUNTER — Inpatient Hospital Stay (HOSPITAL_BASED_OUTPATIENT_CLINIC_OR_DEPARTMENT_OTHER): Payer: Medicare HMO | Admitting: Internal Medicine

## 2022-02-02 ENCOUNTER — Inpatient Hospital Stay: Payer: Medicare HMO | Attending: Internal Medicine | Admitting: Licensed Clinical Social Worker

## 2022-02-02 ENCOUNTER — Inpatient Hospital Stay: Payer: Medicare HMO | Admitting: Pharmacist

## 2022-02-02 ENCOUNTER — Encounter: Payer: Self-pay | Admitting: Internal Medicine

## 2022-02-02 VITALS — BP 146/67 | HR 82

## 2022-02-02 DIAGNOSIS — Z5111 Encounter for antineoplastic chemotherapy: Secondary | ICD-10-CM | POA: Diagnosis not present

## 2022-02-02 DIAGNOSIS — Z7952 Long term (current) use of systemic steroids: Secondary | ICD-10-CM | POA: Diagnosis not present

## 2022-02-02 DIAGNOSIS — Z809 Family history of malignant neoplasm, unspecified: Secondary | ICD-10-CM | POA: Diagnosis not present

## 2022-02-02 DIAGNOSIS — C7951 Secondary malignant neoplasm of bone: Secondary | ICD-10-CM | POA: Diagnosis not present

## 2022-02-02 DIAGNOSIS — C61 Malignant neoplasm of prostate: Secondary | ICD-10-CM

## 2022-02-02 DIAGNOSIS — K1230 Oral mucositis (ulcerative), unspecified: Secondary | ICD-10-CM | POA: Insufficient documentation

## 2022-02-02 DIAGNOSIS — R591 Generalized enlarged lymph nodes: Secondary | ICD-10-CM | POA: Insufficient documentation

## 2022-02-02 DIAGNOSIS — Z5189 Encounter for other specified aftercare: Secondary | ICD-10-CM | POA: Diagnosis not present

## 2022-02-02 DIAGNOSIS — N400 Enlarged prostate without lower urinary tract symptoms: Secondary | ICD-10-CM | POA: Diagnosis not present

## 2022-02-02 LAB — CBC WITH DIFFERENTIAL/PLATELET
Abs Immature Granulocytes: 0.06 10*3/uL (ref 0.00–0.07)
Basophils Absolute: 0 10*3/uL (ref 0.0–0.1)
Basophils Relative: 0 %
Eosinophils Absolute: 0 10*3/uL (ref 0.0–0.5)
Eosinophils Relative: 0 %
HCT: 32.6 % — ABNORMAL LOW (ref 39.0–52.0)
Hemoglobin: 10.7 g/dL — ABNORMAL LOW (ref 13.0–17.0)
Immature Granulocytes: 1 %
Lymphocytes Relative: 8 %
Lymphs Abs: 0.9 10*3/uL (ref 0.7–4.0)
MCH: 31.8 pg (ref 26.0–34.0)
MCHC: 32.8 g/dL (ref 30.0–36.0)
MCV: 97 fL (ref 80.0–100.0)
Monocytes Absolute: 0.3 10*3/uL (ref 0.1–1.0)
Monocytes Relative: 3 %
Neutro Abs: 11.1 10*3/uL — ABNORMAL HIGH (ref 1.7–7.7)
Neutrophils Relative %: 88 %
Platelets: 388 10*3/uL (ref 150–400)
RBC: 3.36 MIL/uL — ABNORMAL LOW (ref 4.22–5.81)
RDW: 14.5 % (ref 11.5–15.5)
WBC: 12.4 10*3/uL — ABNORMAL HIGH (ref 4.0–10.5)
nRBC: 0 % (ref 0.0–0.2)

## 2022-02-02 LAB — COMPREHENSIVE METABOLIC PANEL
ALT: 17 U/L (ref 0–44)
AST: 18 U/L (ref 15–41)
Albumin: 3.4 g/dL — ABNORMAL LOW (ref 3.5–5.0)
Alkaline Phosphatase: 420 U/L — ABNORMAL HIGH (ref 38–126)
Anion gap: 8 (ref 5–15)
BUN: 24 mg/dL — ABNORMAL HIGH (ref 8–23)
CO2: 21 mmol/L — ABNORMAL LOW (ref 22–32)
Calcium: 9 mg/dL (ref 8.9–10.3)
Chloride: 103 mmol/L (ref 98–111)
Creatinine, Ser: 0.94 mg/dL (ref 0.61–1.24)
GFR, Estimated: >60 mL/min (ref >60)
Glucose, Bld: 144 mg/dL — ABNORMAL HIGH (ref 70–99)
Potassium: 4.6 mmol/L (ref 3.5–5.1)
Sodium: 132 mmol/L — ABNORMAL LOW (ref 135–145)
Total Bilirubin: 0.6 mg/dL (ref 0.3–1.2)
Total Protein: 6.8 g/dL (ref 6.5–8.1)

## 2022-02-02 LAB — PSA: Prostatic Specific Antigen: 69.89 ng/mL — ABNORMAL HIGH (ref 0.00–4.00)

## 2022-02-02 MED ORDER — SODIUM CHLORIDE 0.9 % IV SOLN: Freq: Once | INTRAVENOUS | Status: DC

## 2022-02-02 MED ORDER — HEPARIN SOD (PORK) LOCK FLUSH 100 UNIT/ML IV SOLN
INTRAVENOUS | Status: AC
  Administered 2022-02-02: 500 [IU]
  Filled 2022-02-02: qty 5

## 2022-02-02 MED ORDER — SODIUM CHLORIDE 0.9 % IV SOLN
75.0000 mg/m2 | Freq: Once | INTRAVENOUS | Status: AC
  Administered 2022-02-02: 130 mg via INTRAVENOUS
  Filled 2022-02-02: qty 13

## 2022-02-02 MED ORDER — SODIUM CHLORIDE 0.9 % IV SOLN
Freq: Once | INTRAVENOUS | Status: AC
  Filled 2022-02-02: qty 250

## 2022-02-02 MED ORDER — HEPARIN SOD (PORK) LOCK FLUSH 100 UNIT/ML IV SOLN
500.0000 [IU] | Freq: Once | INTRAVENOUS | Status: AC | PRN
  Filled 2022-02-02: qty 5

## 2022-02-02 MED ORDER — DEXAMETHASONE SODIUM PHOSPHATE 10 MG/ML IJ SOLN
10.0000 mg | Freq: Once | INTRAMUSCULAR | Status: AC
  Administered 2022-02-02: 10 mg via INTRAVENOUS
  Filled 2022-02-02: qty 1

## 2022-02-02 MED ORDER — ONDANSETRON HCL 4 MG/2ML IJ SOLN
8.0000 mg | Freq: Once | INTRAMUSCULAR | Status: AC
  Administered 2022-02-02: 8 mg via INTRAVENOUS
  Filled 2022-02-02: qty 4

## 2022-02-02 NOTE — Progress Notes (Signed)
Patient denies new problems/concerns today.   °

## 2022-02-02 NOTE — Patient Instructions (Signed)
[facility]  Discharge Instructions: ?Thank you for choosing [facility] to provide your oncology and hematology care.  ?If you have a lab appointment with the Cancer Center, please go directly to the Cancer Center and check in at the registration area. ? ?Wear comfortable clothing and clothing appropriate for easy access to any Portacath or PICC line.  ? ?We strive to give you quality time with your provider. You may need to reschedule your appointment if you arrive late (15 or more minutes).  Arriving late affects you and other patients whose appointments are after yours.  Also, if you miss three or more appointments without notifying the office, you may be dismissed from the clinic at the provider?s discretion.    ?  ?For prescription refill requests, have your pharmacy contact our office and allow 72 hours for refills to be completed.   ? ?Today you received the following chemotherapy and/or immunotherapy agents: Taxotere   ?  ?To help prevent nausea and vomiting after your treatment, we encourage you to take your nausea medication as directed. ? ?BELOW ARE SYMPTOMS THAT SHOULD BE REPORTED IMMEDIATELY: ?*FEVER GREATER THAN 100.4 F (38 ?C) OR HIGHER ?*CHILLS OR SWEATING ?*NAUSEA AND VOMITING THAT IS NOT CONTROLLED WITH YOUR NAUSEA MEDICATION ?*UNUSUAL SHORTNESS OF BREATH ?*UNUSUAL BRUISING OR BLEEDING ?*URINARY PROBLEMS (pain or burning when urinating, or frequent urination) ?*BOWEL PROBLEMS (unusual diarrhea, constipation, pain near the anus) ?TENDERNESS IN MOUTH AND THROAT WITH OR WITHOUT PRESENCE OF ULCERS (sore throat, sores in mouth, or a toothache) ?UNUSUAL RASH, SWELLING OR PAIN  ?UNUSUAL VAGINAL DISCHARGE OR ITCHING  ? ?Items with * indicate a potential emergency and should be followed up as soon as possible or go to the Emergency Department if any problems should occur. ? ?Please show the CHEMOTHERAPY ALERT CARD or IMMUNOTHERAPY ALERT CARD at check-in to  the Emergency Department and triage nurse. ? ?Should you have questions after your visit or need to cancel or reschedule your appointment, please contact [facility]  [phone] and follow the prompts.  Office hours are 8:00 a.m. to 4:30 p.m. Monday - Friday. Please note that voicemails left after 4:00 p.m. may not be returned until the following business day.  We are closed weekends and major holidays. You have access to a nurse at all times for urgent questions. Please call the main number to the clinic [phone] and follow the prompts. ? ?For any non-urgent questions, you may also contact your provider using MyChart. We now offer e-Visits for anyone 18 and older to request care online for non-urgent symptoms. For details visit [email or web address]. ?  ?Also download the MyChart app! Go to the app store, search "MyChart", open the app, select [facility], and log in with your MyChart username and password. ? ?Due to Covid, a mask is required upon entering the hospital/clinic. If you do not have a mask, one will be given to you upon arrival. For doctor visits, patients may have 1 support person aged 18 or older with them. For treatment visits, patients cannot have anyone with them due to current Covid guidelines and our immunocompromised population.  ?

## 2022-02-02 NOTE — Progress Notes (Signed)
? ?Oral Chemotherapy Clinic ?[place]  ?[phone] [phone] ? ?Patient Care Team: ?Steele Sizer, MD as PCP - General (Family Medicine)  ? ?Name of the patient: Eric [name]  ?[id number]  ?04-Jun-1941  ? ?Date of visit: 02/02/22 ? ?HPI: Patient is a 81 y.o. male with metastatic castration sensitive prostate cancer. He is currently receiving docetaxel and ADT, the plan is to add on Nubeqa (darolutamide) today 02/02/22. ? ?Reason for Consult: Nubeqa (darolutamide) oral chemotherapy education. ? ? ?PAST MEDICAL HISTORY: ?Past Medical History:  ?Diagnosis Date  ? COPD (chronic obstructive pulmonary disease) ([place])   ? Dyspnea   ? with exertion  ? GERD (gastroesophageal reflux disease)   ? occ  ? Hyperlipidemia   ? Hypertension   ? ? ?HEMATOLOGY/ONCOLOGY HISTORY:  ?Oncology History Overview Note  ?compatible with widespread metastatic prostate ?cancer, including innumerable sclerotic osseous lesions and ?extensive lymphadenopathy in the pelvis and retroperitoneum, as ?detailed above. This includes lymphadenopathy in the upper right ?hemipelvis which likely causes severe stenosis or complete occlusion ?of the right common iliac vein. ?2. There is also an indeterminate hypovascular lesion in segment 5 ?of the liver which may represent a metastatic lesion. This could be ?better characterized with follow-up abdominal MRI with and without ?IV gadolinium if clinically appropriate. ?3. Aortic atherosclerosis with mild fusiform aneurysmal dilatation ?of the infrarenal abdominal aorta measuring up to 3.2 x 3.0 cm. ?Recommend follow-up ultrasound every 3 years. This recommendation ?follows ACR consensus guidelines: White Paper of the ACR Incidental ?Findings Committee II on Vascular Findings. J Am Coll Radiol 2013; ?34:287-681. ?4. Bladder wall appears mildly thickened, likely related to bladder ?outlet obstruction given the enlarged prostate gland. ?5. Severe colonic diverticulosis without  evidence of acute ?diverticulitis at this time. ? ?IMPRESSION: ?There are numerous foci of abnormal tracer uptake in the axial and ?proximal appendicular skeleton consistent with extensive skeletal ?metastatic disease. ?  ? ? ?#Stage IV castrate sensitive prostate cancer-December/20 2023-MRI negative for any liver lesions.; FEB 24th, 2022-Firmagon loading dose; ? ?# march 24th-Taxoeter with udenyca; ?4/12- add darolutamide ? ?# DEC 2022- 157 [[facility]; Urology] ?  ?Prostate cancer ([place])  ?12/13/2021 Initial Diagnosis  ? Prostate cancer [facility]) ?  ?12/13/2021 Cancer Staging  ? Staging form: Prostate, AJCC 8th Edition ?- Clinical: Stage IVB (cT2c, cN1, cM1, PSA: 374, Grade Group: 5) - Signed by Cammie Sickle, MD on 12/13/2021 ?Prostate specific antigen (PSA) range: 20 or greater ?Histologic grading system: 5 grade system ? ?  ?01/12/2022 -  Chemotherapy  ? Patient is on Treatment Plan : PROSTATE Docetaxel + Prednisone q21d  ?   ? ? ?ALLERGIES:  has No Known Allergies. ? ?MEDICATIONS:  ?Current Outpatient Medications  ?Medication Sig Dispense Refill  ? acetaminophen (TYLENOL) 500 MG tablet Take 1 tablet (500 mg total) by mouth every 6 (six) hours as needed. 30 tablet 0  ? Ca Carbonate-Mag Hydroxide (ROLAIDS PO) Take 1 tablet by mouth as needed.    ? dexamethasone (DECADRON) 4 MG tablet Take one pill AM & PM x 2 days-TAKE the day prior to chemo; and the day after chemo; do not take on the day of chemo. 60 tablet 0  ? ezetimibe (ZETIA) 10 MG tablet Take 1 tablet (10 mg total) by mouth daily. (Patient taking differently: Take 10 mg by mouth every morning.) 90 tablet 1  ? Ipratropium-Albuterol (COMBIVENT) 20-100 MCG/ACT AERS respimat Inhale 1 puff into the lungs every 6 (six) hours. 4 g 1  ? lisinopril (ZESTRIL) 5 MG tablet  Take 1 tablet (5 mg total) by mouth daily. For bp and kidney protection 90 tablet 1  ? meclizine (ANTIVERT) 25 MG tablet TAKE 1 TABLET (25 MG TOTAL) BY MOUTH DAILY AS NEEDED FOR DIZZINESS. 30 tablet 0   ? metoprolol succinate (TOPROL-XL) 25 MG 24 hr tablet Take 1 tablet (25 mg total) by mouth daily. Take with or immediately following a meal. (Patient taking differently: Take 25 mg by mouth every morning. Take with or immediately following a meal.) 90 tablet 1  ? ondansetron (ZOFRAN) 8 MG tablet One pill every 8 hours as needed for nausea/vomitting. 40 tablet 1  ? prochlorperazine (COMPAZINE) 10 MG tablet Take 1 tablet (10 mg total) by mouth every 6 (six) hours as needed for nausea or vomiting. 40 tablet 1  ? rosuvastatin (CRESTOR) 40 MG tablet Take 1 tablet (40 mg total) by mouth daily. (Patient taking differently: Take 40 mg by mouth every morning.) 90 tablet 1  ? tamsulosin (FLOMAX) 0.4 MG CAPS capsule Take 1 capsule (0.4 mg total) by mouth daily after supper. 30 capsule 3  ? ?No current facility-administered medications for this visit.  ? ?Facility-Administered Medications Ordered in Other Visits  ?Medication Dose Route Frequency Provider Last Rate Last Admin  ? DOCEtaxel (TAXOTERE) 130 mg in sodium chloride 0.9 % 250 mL chemo infusion  75 mg/m2 (Treatment Plan Recorded) Intravenous Once Charlaine Dalton R, MD      ? heparin lock flush 100 UNIT/ML injection           ? heparin lock flush 100 unit/mL  500 Units Intracatheter Once PRN Cammie Sickle, MD      ? ? ?VITAL SIGNS: ?There were no vitals taken for this visit. ?There were no vitals filed for this visit.  ?Estimated body mass index is 24.37 kg/m? as calculated from the following: ?  Height as of an earlier encounter on 02/02/22: '5\' 6"'$  (1.676 m). ?  Weight as of an earlier encounter on 02/02/22: 68.5 kg (151 lb). ? ?LABS: ?CBC: ?   ?Component Value Date/Time  ? WBC 12.4 (H) 02/02/2022 0754  ? HGB 10.7 (L) 02/02/2022 0754  ? HCT 32.6 (L) 02/02/2022 0754  ? PLT 388 02/02/2022 0754  ? MCV 97.0 02/02/2022 0754  ? NEUTROABS 11.1 (H) 02/02/2022 0754  ? LYMPHSABS 0.9 02/02/2022 0754  ? MONOABS 0.3 02/02/2022 0754  ? EOSABS 0.0 02/02/2022 0754  ? BASOSABS  0.0 02/02/2022 0754  ? ?Comprehensive Metabolic Panel: ?   ?Component Value Date/Time  ? NA 132 (L) 02/02/2022 0754  ? NA 142 08/17/2015 0928  ? K 4.6 02/02/2022 0754  ? CL 103 02/02/2022 0754  ? CO2 21 (L) 02/02/2022 0754  ? BUN 24 (H) 02/02/2022 0754  ? BUN 17 08/17/2015 0928  ? CREATININE 0.94 02/02/2022 0754  ? CREATININE 1.06 09/30/2021 1119  ? GLUCOSE 144 (H) 02/02/2022 0754  ? CALCIUM 9.0 02/02/2022 0754  ? AST 18 02/02/2022 0754  ? ALT 17 02/02/2022 0754  ? ALKPHOS 420 (H) 02/02/2022 0754  ? BILITOT 0.6 02/02/2022 0754  ? BILITOT 0.4 08/17/2015 0928  ? PROT 6.8 02/02/2022 0754  ? PROT 6.8 08/17/2015 0928  ? ALBUMIN 3.4 (L) 02/02/2022 0754  ? ALBUMIN 4.1 08/17/2015 0928  ? ? ? ?Present during today's visit: patient and his daughter ? ?Start plan: Mr. Rastetter will start taking his darolutamide today 02/02/22 ?  ?Patient Education ?I spoke with patient for overview of new oral chemotherapy medication: darolutamide  ? ?Administration: ?Counseled patient on  administration, dosing, side effects, monitoring, drug-food interactions, safe handling, storage, and disposal. ?Patient will take 2 tablets (600 mg) by mouth 2 (two) times daily with a meal.. ? ?Side Effects: ?Side effects include but not limited to: decreased wbc and changes on liver function.   ? ?Drug-drug Interactions (DDI): ?Staff messaged PCP Dr. Ancil Boozer about the DDI with rosuvastatin (See assessment note for additional detail). Instructed patient to stop his rosuvastatin '40mg'$  for now due to the DDI. ? ?Adherence: ?After discussion with patient no patient barriers to medication adherence identified.  ?Reviewed with patient importance of keeping a medication schedule and plan for any missed doses. ? ?Mr. McCollom voiced understanding and appreciation. All questions answered. Medication handout provided. ? ?Provided patient with [facility] phone number. Patient knows to call the office with questions or concerns. Oral Chem[facility] will continue to follow. ? ?Patient expressed understanding and was in agreement with this plan. He also understands that He can call clinic at any time with any questions, concern

## 2022-02-02 NOTE — Progress Notes (Signed)
Nutrition Assessment ? ? ?Reason for Assessment:  ?Referral received from Dr B ? ? ?ASSESSMENT:  ?81 year old male with metastatic prostate cancer with bone mets.  Past medical history of COPD, GERD, HLD, HTN.  Patient on firamgon (ADT therapy), docetaxel, adding darolutamide today ? ?Met with patient during infusion.  Patient eating cookies during visit.  Reports that his appetite is better after treatment for nausea and magic mouthwash for mouth sores.  Says that he generally drinks a boost/ensure 1-3 times per day.  Later on mid morning will eat cereal with almond milk (drinks the milk) or eggs with breakfast meat.  Sometimes for lunch will have soup or full course meal (meat and couple of sides). Dinner is meat and couple of sides.  Sometimes will make a smoothie with almond milk and fruit.  Mouth sores have improved ? ? ? ?Nutrition Focused Physical Exam:  ? ?Orbital Region: mild ?Buccal Region: mild ?Upper Arm Region: unable to assess ?Thoracic and Lumbar Region: unable to assess due to patient clothing and in infusion ?Temple Region: mild ?Clavicle Bone Region: WNL ?Shoulder and Acromion Bone Region: unable to assess ?Scapular Bone Region: unable to assess ?Dorsal Hand: mild ?Patellar Region: mild ?Anterior Thigh Region: moderate ?Posterior Calf Region: normal ?Edema (RD assessment): none ?Hair: observed ?Eyes: observed ?Mouth: observed ?Skin: observed ?Nails: observed ? ? ?Medications: decadron, zofran, compazine, MMW,  ? ? ?Labs: Na 132, glucose 144, BUN 24, creatinine 0.94, ca 9.0 ? ? ?Anthropometrics:  ? ?Height: 66 inches ?Weight: 151 lb today ?UBW: 148-151 lb ?BMI: 24 ? ?Stable weight ? ?Estimated Energy Needs ? ?Kcals: 1725-2000 ?Protein: 86-100 g ?Fluid: 1725-2000 ml ? ? ?NUTRITION DIAGNOSIS: Food and nutrition related knowledge deficit related to cancer treatment side effects as evidenced by mouth sores, nausea, ADT treatment and risk of bone loss ? ? ?MALNUTRITION DIAGNOSIS: not at this  time ? ? ?INTERVENTION:  ?Discussed importance of good nutrition during treatment.   ?Discussed importance of eating foods high in protein.  ?Also discussed importance of consuming foods high in calcium (1000-1235m daily) ?Recommend checking Vit D level and supplement if low.  Will message MD. ?Continue oral nutrition supplements for added nutrition ?Contact information provided ? ? ?MONITORING, EVALUATION, GOAL: weight trends, intake ? ? ?Next Visit: Friday, April 21 during infusion ? ?Crystallee Werden B. AZenia Resides RD, LDN ?Registered Dietitian ?[phone]? ? ? ? ? ?

## 2022-02-02 NOTE — Progress Notes (Signed)
[address] [place] ?OFFICE PROGRESS NOTE ? ?Patient Care Team: ?Steele Sizer, MD as PCP - General (Family Medicine) ? ? Cancer Staging  ?Prostate cancer ([place]) ?Staging form: Prostate, AJCC 8th Edition ?- Clinical: Stage IVB (cT2c, cN1, cM1, PSA: 374, Grade Group: 5) - Signed by Cammie Sickle, MD on 12/13/2021 ?Prostate specific antigen (PSA) range: 20 or greater ?Histologic grading system: 5 grade system ? ? ? ?Oncology History Overview Note  ?compatible with widespread metastatic prostate ?cancer, including innumerable sclerotic osseous lesions and ?extensive lymphadenopathy in the pelvis and retroperitoneum, as ?detailed above. This includes lymphadenopathy in the upper right ?hemipelvis which likely causes severe stenosis or complete occlusion ?of the right common iliac vein. ?2. There is also an indeterminate hypovascular lesion in segment 5 ?of the liver which may represent a metastatic lesion. This could be ?better characterized with follow-up abdominal MRI with and without ?IV gadolinium if clinically appropriate. ?3. Aortic atherosclerosis with mild fusiform aneurysmal dilatation ?of the infrarenal abdominal aorta measuring up to 3.2 x 3.0 cm. ?Recommend follow-up ultrasound every 3 years. This recommendation ?follows ACR consensus guidelines: White Paper of the ACR Incidental ?Findings Committee II on Vascular Findings. J Am Coll Radiol 2013; ?23:536-144. ?4. Bladder wall appears mildly thickened, likely related to bladder ?outlet obstruction given the enlarged prostate gland. ?5. Severe colonic diverticulosis without evidence of acute ?diverticulitis at this time. ? ?IMPRESSION: ?There are numerous foci of abnormal tracer uptake in the axial and ?proximal appendicular skeleton consistent with extensive skeletal ?metastatic disease. ?  ? ? ?#Stage IV castrate sensitive prostate cancer-December/20 2023-MRI negative for any liver lesions.; FEB 24th, 2022-Firmagon loading dose; ? ?# march  24th-Taxoeter with udenyca; ?4/12- add darolutamide ? ?# DEC 2022- 315 [[facility]; Urology] ?  ?Prostate cancer ([place])  ?12/13/2021 Initial Diagnosis  ? Prostate cancer [facility]) ?  ?12/13/2021 Cancer Staging  ? Staging form: Prostate, AJCC 8th Edition ?- Clinical: Stage IVB (cT2c, cN1, cM1, PSA: 374, Grade Group: 5) - Signed by Cammie Sickle, MD on 12/13/2021 ?Prostate specific antigen (PSA) range: 20 or greater ?Histologic grading system: 5 grade system ? ?  ?01/12/2022 -  Chemotherapy  ? Patient is on Treatment Plan : PROSTATE Docetaxel + Prednisone q21d  ?   ? ?  ?HISTORY OF PRESENT ILLNESS: Ambulating independently [? cane]; with daughter.  ?  ?Eric [name] 81 y.o.  male pleasant patient above history of castrate sensitive metastatic prostate cancer currently on Taxotere chemotherapy is here for follow-up. ? ?As per the patient's daughter patient had mild nausea/also mild sores in the mouth which improved with Magic mouthwash. He received IV fluids; and dexamethasone approximate 10 days posttreatment.  Improvement of appetite. ? ?No fever no chills.  No significant diarrhea.  No tingling or numbness.  No falls. ? ?Review of Systems  ?Constitutional:  Positive for malaise/fatigue and weight loss. Negative for chills, diaphoresis and fever.  ?HENT:  Negative for nosebleeds and sore throat.   ?Eyes:  Negative for double vision.  ?Respiratory:  Negative for cough, hemoptysis, sputum production, shortness of breath and wheezing.   ?Cardiovascular:  Negative for chest pain, palpitations, orthopnea and leg swelling.  ?Gastrointestinal:  Negative for abdominal pain, blood in stool, constipation, diarrhea, heartburn, melena, nausea and vomiting.  ?Genitourinary:  Negative for dysuria, frequency and urgency.  ?Musculoskeletal:  Positive for back pain and joint pain.  ?Skin: Negative.  Negative for itching and rash.  ?Neurological:  Negative for dizziness, tingling, focal weakness, weakness and headaches.   ?Endo/Heme/Allergies:  Does not bruise/bleed easily.  ?Psychiatric/Behavioral:  Negative for depression. The patient is not nervous/anxious and does not have insomnia.   ?  ? ?PAST MEDICAL HISTORY :  ?Past Medical History:  ?Diagnosis Date  ? COPD (chronic obstructive pulmonary disease) ([place])   ? Dyspnea   ? with exertion  ? GERD (gastroesophageal reflux disease)   ? occ  ? Hyperlipidemia   ? Hypertension   ? ? ?PAST SURGICAL HISTORY :   ?Past Surgical History:  ?Procedure Laterality Date  ? COLONOSCOPY    ? HEMORROIDECTOMY    ? IR IMAGING GUIDED PORT INSERTION  12/20/2021  ? PROSTATE BIOPSY N/A 10/26/2021  ? Procedure: PROSTATE BIOPSY;  Surgeon: Abbie Sons, MD;  Location: [facility] ORS;  Service: Urology;  Laterality: N/A;  ? TRANSRECTAL ULTRASOUND N/A 10/26/2021  ? Procedure: TRANSRECTAL ULTRASOUND;  Surgeon: Abbie Sons, MD;  Location: [facility] ORS;  Service: Urology;  Laterality: N/A;  ? ? ?FAMILY HISTORY :   ?Family History  ?Problem Relation Age of Onset  ? Heart disease Mother   ? COPD Father   ? ? ?SOCIAL HISTORY:   ?Social History  ? ?Tobacco Use  ? Smoking status: Every Day  ?  Packs/day: 0.50  ?  Years: 60.00  ?  Pack years: 30.00  ?  Types: Cigarettes  ? Smokeless tobacco: Never  ? Tobacco comments:  ?  smoking cessation information provided  ?Vaping Use  ? Vaping Use: Never used  ?Substance Use Topics  ? Alcohol use: Not Currently  ? ? ?ALLERGIES:  has No Known Allergies. ? ?MEDICATIONS:  ?Current Outpatient Medications  ?Medication Sig Dispense Refill  ? acetaminophen (TYLENOL) 500 MG tablet Take 1 tablet (500 mg total) by mouth every 6 (six) hours as needed. 30 tablet 0  ? Ca Carbonate-Mag Hydroxide (ROLAIDS PO) Take 1 tablet by mouth as needed.    ? dexamethasone (DECADRON) 4 MG tablet Take one pill AM & PM x 2 days-TAKE the day prior to chemo; and the day after chemo; do not take on the day of chemo. 60 tablet 0  ? ezetimibe (ZETIA) 10 MG tablet Take 1 tablet (10 mg total) by mouth daily. (Patient  taking differently: Take 10 mg by mouth every morning.) 90 tablet 1  ? Ipratropium-Albuterol (COMBIVENT) 20-100 MCG/ACT AERS respimat Inhale 1 puff into the lungs every 6 (six) hours. 4 g 1  ? lisinopril (ZESTRIL) 5 MG tablet Take 1 tablet (5 mg total) by mouth daily. For bp and kidney protection 90 tablet 1  ? meclizine (ANTIVERT) 25 MG tablet TAKE 1 TABLET (25 MG TOTAL) BY MOUTH DAILY AS NEEDED FOR DIZZINESS. 30 tablet 0  ? metoprolol succinate (TOPROL-XL) 25 MG 24 hr tablet Take 1 tablet (25 mg total) by mouth daily. Take with or immediately following a meal. (Patient taking differently: Take 25 mg by mouth every morning. Take with or immediately following a meal.) 90 tablet 1  ? ondansetron (ZOFRAN) 8 MG tablet One pill every 8 hours as needed for nausea/vomitting. 40 tablet 1  ? prochlorperazine (COMPAZINE) 10 MG tablet Take 1 tablet (10 mg total) by mouth every 6 (six) hours as needed for nausea or vomiting. 40 tablet 1  ? rosuvastatin (CRESTOR) 40 MG tablet Take 1 tablet (40 mg total) by mouth daily. (Patient taking differently: Take 40 mg by mouth every morning.) 90 tablet 1  ? tamsulosin (FLOMAX) 0.4 MG CAPS capsule Take 1 capsule (0.4 mg total) by mouth daily after supper. 30 capsule  3  ? ?No current facility-administered medications for this visit.  ? ? ?PHYSICAL EXAMINATION: ?ECOG PERFORMANCE STATUS: 1 - Symptomatic but completely ambulatory ? ?BP 139/74   Pulse 95   Temp 97.9 ?F (36.6 ?C)   Resp 16   Ht '5\' 6"'$  (1.676 m)   Wt 151 lb (68.5 kg)   BMI 24.37 kg/m?  ? Danley Danker Weights  ? 02/02/22 0826  ?Weight: 151 lb (68.5 kg)  ? ? ? ?Physical Exam ?Vitals and nursing note reviewed.  ?HENT:  ?   Head: Normocephalic and atraumatic.  ?   Mouth/Throat:  ?   Pharynx: Oropharynx is clear.  ?Eyes:  ?   Extraocular Movements: Extraocular movements intact.  ?   Pupils: Pupils are equal, round, and reactive to light.  ?Cardiovascular:  ?   Rate and Rhythm: Normal rate and regular rhythm.  ?Pulmonary:  ?    Comments: Decreased breath sounds bilaterally.  ?Abdominal:  ?   Palpations: Abdomen is soft.  ?Musculoskeletal:     ?   General: Normal range of motion.  ?   Cervical back: Normal range of motion.  ?Skin: ?   General: Skin is warm.

## 2022-02-02 NOTE — Assessment & Plan Note (Addendum)
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 MRI- liver-1st-March 2023 hemangioma.  #1 loading [place] 12/17/2021.  Taxotere plus ADT.  PSA improving ? ?#Proceed with cycle # 2 of Taxotere [60 mg per metered square]. Labs today reviewed;  acceptable for treatment today.  Recommend adding darolutamide today; 300 mg- 2 pills BID. Discussed the potential side effects- including but not limited of  diarrhea; faigue; skin rash.  ? ?# Mucositis: recommend salt/baking soda rinses.  ?  ?#Prostatism symptoms: order  Flomax; discussed regarding orthostasis with Flomax.  ? ?# Weight loss/malnutrition-evaluation with nutrition. ? ?# DISPOSITION:   ?#Taxotere today; D-2 Ellen Henri ?# Firmagon on 4/21; labs- cbc/bmp; possible IVFs- 1 lit; dex 8 mg.  ?# follow up in 3 weeks- MD; labs-cbc/cmp;PSA; taxoetre;d-2 Ellen Henri-- Dr.B  ? ? ?

## 2022-02-02 NOTE — Progress Notes (Signed)
REFERRING PROVIDER: ?Cammie Sickle, MD ?[place],  [place] [address] ? ?PRIMARY PROVIDER:  ?Steele Sizer, MD ? ?PRIMARY REASON FOR VISIT:  ?1. Prostate cancer ([place])   ?2. Family history of cancer   ? ? ? ?HISTORY OF PRESENT ILLNESS:   ?[name], a 81 y.o. male, was seen for a [place] cancer genetics consultation at the request of Dr. Rogue Bussing due to a personal and family history of cancer.  [name] presents to clinic today to discuss the possibility of a hereditary predisposition to cancer, genetic testing, and to further clarify his future cancer risks, as well as potential cancer risks for family members.  ? ?In 2023, at the age of 22, [name] was diagnosed with metastatic prostate cancer. This is currently being treated with chemotherapy. ? ?CANCER HISTORY:  ?Oncology History Overview Note  ?compatible with widespread metastatic prostate ?cancer, including innumerable sclerotic osseous lesions and ?extensive lymphadenopathy in the pelvis and retroperitoneum, as ?detailed above. This includes lymphadenopathy in the upper right ?hemipelvis which likely causes severe stenosis or complete occlusion ?of the right common iliac vein. ?2. There is also an indeterminate hypovascular lesion in segment 5 ?of the liver which may represent a metastatic lesion. This could be ?better characterized with follow-up abdominal MRI with and without ?IV gadolinium if clinically appropriate. ?3. Aortic atherosclerosis with mild fusiform aneurysmal dilatation ?of the infrarenal abdominal aorta measuring up to 3.2 x 3.0 cm. ?Recommend follow-up ultrasound every 3 years. This recommendation ?follows ACR consensus guidelines: White Paper of the ACR Incidental ?Findings Committee II on Vascular Findings. J Am Coll Radiol 2013; ?49:179-150. ?4. Bladder wall appears mildly thickened, likely related to bladder ?outlet obstruction given the enlarged prostate gland. ?5. Severe colonic diverticulosis  without evidence of acute ?diverticulitis at this time. ? ?IMPRESSION: ?There are numerous foci of abnormal tracer uptake in the axial and ?proximal appendicular skeleton consistent with extensive skeletal ?metastatic disease. ?  ? ? ?#Stage IV castrate sensitive prostate cancer-December/20 2023-MRI negative for any liver lesions.; FEB 24th, 2022-Firmagon loading dose; ? ?# march 24th-Taxoeter with udenyca; ?4/12- add darolutamide ? ?# DEC 2022- 569 [[facility]; Urology] ?  ?Prostate cancer ([name])  ?12/13/2021 Initial Diagnosis  ? Prostate cancer [facility]) ?  ?12/13/2021 Cancer Staging  ? Staging form: Prostate, AJCC 8th Edition ?- Clinical: Stage IVB (cT2c, cN1, cM1, PSA: 374, Grade Group: 5) - Signed by Cammie Sickle, MD on 12/13/2021 ?Prostate specific antigen (PSA) range: 20 or greater ?Histologic grading system: 5 grade system ? ?  ?01/12/2022 -  Chemotherapy  ? Patient is on Treatment Plan : PROSTATE Docetaxel + Prednisone q21d  ?   ? ? ?Past Medical History:  ?Diagnosis Date  ? COPD (chronic obstructive pulmonary disease) ([place])   ? Dyspnea   ? with exertion  ? Family history of cancer   ? GERD (gastroesophageal reflux disease)   ? occ  ? Hyperlipidemia   ? Hypertension   ? ? ?Past Surgical History:  ?Procedure Laterality Date  ? COLONOSCOPY    ? HEMORROIDECTOMY    ? IR IMAGING GUIDED PORT INSERTION  12/20/2021  ? PROSTATE BIOPSY N/A 10/26/2021  ? Procedure: PROSTATE BIOPSY;  Surgeon: Abbie Sons, MD;  Location: [facility] ORS;  Service: Urology;  Laterality: N/A;  ? TRANSRECTAL ULTRASOUND N/A 10/26/2021  ? Procedure: TRANSRECTAL ULTRASOUND;  Surgeon: Abbie Sons, MD;  Location: [facility] ORS;  Service: Urology;  Laterality: N/A;  ? ? ?Social History  ? ?Socioeconomic History  ? Marital  status: Single  ?  Spouse name: Not on file  ? Number of children: 1  ? Years of education: Not on file  ? Highest education level: High school graduate  ?Occupational History  ? Occupation: retired   ?Tobacco Use  ? Smoking status:  Every Day  ?  Packs/day: 0.50  ?  Years: 60.00  ?  Pack years: 30.00  ?  Types: Cigarettes  ? Smokeless tobacco: Never  ? Tobacco comments:  ?  smoking cessation information provided  ?Vaping Use  ? Vaping Use: Never used  ?Substance and Sexual Activity  ? Alcohol use: Not Currently  ? Drug use: Not on file  ? Sexual activity: Not Currently  ?Other Topics Concern  ? Not on file  ?Social History Narrative  ? Single, lives with male friend Eric [name] is care giver] for the past 20 plus years. 1/2 ppd smoke; no alcohol. 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 Daughter lives 10-15 mins; in [place].   ? ?Social Determinants of Health  ? ?Financial Resource Strain: Low Risk   ? Difficulty of Paying Living Expenses: Not hard at all  ?Food Insecurity: No Food Insecurity  ? Worried About Charity fundraiser in the Last Year: Never true  ? Ran Out of Food in the Last Year: Never true  ?Transportation Needs: No Transportation Needs  ? Lack of Transportation (Medical): No  ? Lack of Transportation (Non-Medical): No  ?Physical Activity: Inactive  ? Days of Exercise per Week: 0 days  ? Minutes of Exercise per Session: 0 min  ?Stress: No Stress Concern Present  ? Feeling of Stress : Only a little  ?Social Connections: Socially Isolated  ? Frequency of Communication with Friends and Family: Three times a week  ? Frequency of Social Gatherings with Friends and Family: Three times a week  ? Attends Religious Services: Never  ? Active Member of Clubs or Organizations: No  ? Attends Archivist Meetings: Never  ? Marital Status: Widowed  ?  ? ?FAMILY HISTORY:  ?We obtained a detailed, 4-generation family history.  Significant diagnoses are listed below: ?Family History  ?Problem Relation Age of Onset  ? Heart disease Mother   ? COPD Father   ? Cancer Brother 66  ?     blood cancer; unk exact type  ? Cancer Maternal Aunt   ?     unk type  ? Cancer Paternal Aunt   ?     unk type  ? Cancer Paternal Uncle   ?     one  unk type; one had throat  ? Cancer Cousin   ?     unk types  ? ?[name] has 1 daughter, Eric [name], age 48. She had cervical cancer at 54. Mr. Lenzen also has 2 brothers. One was diagnosed with a blood cancer at 9 and is still getting treatments for it.  ? ?Mr. Abrahamsen's mother died at 79. Patient had many aunts and uncles, one aunt had cancer, unknown type and two maternal cousins had cancers, unknown types. Maternal grandfather died at 20, unk age of death for grandmother. ? ?Mr. Feenstra's father died at 29 of emphysema. Patient had 11 maternal aunts/uncles. Two uncles had cancer, one was an unknown type and the other was throat cancer. An aunt had an unknown type of cancer as well. Paternal grandmother died over age 54, grandfather died at a younger age.  ? ?Mr. Hodapp is unaware of previous family history of genetic  testing for hereditary cancer risks. There is no reported Ashkenazi Jewish ancestry. There is no known consanguinity. ? ? ?GENETIC COUNSELING ASSESSMENT: Mr. Berthelot is a 81 y.o. male with a personal history of metastatic prostate cancer which is somewhat suggestive of a hereditary cancer syndrome and predisposition to cancer. We, therefore, discussed and recommended the following at today's visit.  ? ?DISCUSSION: We discussed that approximately 10% of prostate cancer is hereditary. Most cases of hereditary prostate cancer are associated with BRCA1/BRCA2 genes, although there are other genes associated with hereditary cancer as well. Cancers and risks are gene specific.  We discussed that testing is beneficial for several reasons including  knowing about other cancer risks, identifying potential screening and risk-reduction options that may be appropriate, and to understand if other family members could be at risk for cancer and allow them to undergo genetic testing.  ? ?We reviewed the characteristics, features and inheritance patterns of hereditary cancer syndromes. We also discussed  genetic testing, including the appropriate family members to test, the process of testing, insurance coverage and turn-around-time for results. We discussed the implications of a negative, positive and/or va

## 2022-02-03 ENCOUNTER — Ambulatory Visit: Payer: Medicare HMO

## 2022-02-03 ENCOUNTER — Inpatient Hospital Stay: Payer: Medicare HMO

## 2022-02-03 DIAGNOSIS — Z7952 Long term (current) use of systemic steroids: Secondary | ICD-10-CM | POA: Diagnosis not present

## 2022-02-03 DIAGNOSIS — R591 Generalized enlarged lymph nodes: Secondary | ICD-10-CM | POA: Diagnosis not present

## 2022-02-03 DIAGNOSIS — N400 Enlarged prostate without lower urinary tract symptoms: Secondary | ICD-10-CM | POA: Diagnosis not present

## 2022-02-03 DIAGNOSIS — C61 Malignant neoplasm of prostate: Secondary | ICD-10-CM

## 2022-02-03 DIAGNOSIS — Z5111 Encounter for antineoplastic chemotherapy: Secondary | ICD-10-CM | POA: Diagnosis not present

## 2022-02-03 DIAGNOSIS — K1230 Oral mucositis (ulcerative), unspecified: Secondary | ICD-10-CM | POA: Diagnosis not present

## 2022-02-03 DIAGNOSIS — Z5189 Encounter for other specified aftercare: Secondary | ICD-10-CM | POA: Diagnosis not present

## 2022-02-03 DIAGNOSIS — C7951 Secondary malignant neoplasm of bone: Secondary | ICD-10-CM | POA: Diagnosis not present

## 2022-02-03 MED ORDER — PEGFILGRASTIM-CBQV 6 MG/0.6ML ~~LOC~~ SOSY
6.0000 mg | PREFILLED_SYRINGE | Freq: Once | SUBCUTANEOUS | Status: AC
  Administered 2022-02-03: 6 mg via SUBCUTANEOUS
  Filled 2022-02-03: qty 0.6

## 2022-02-07 ENCOUNTER — Other Ambulatory Visit: Payer: Self-pay

## 2022-02-07 DIAGNOSIS — C61 Malignant neoplasm of prostate: Secondary | ICD-10-CM

## 2022-02-10 ENCOUNTER — Ambulatory Visit (INDEPENDENT_AMBULATORY_CARE_PROVIDER_SITE_OTHER): Payer: Medicare HMO

## 2022-02-10 DIAGNOSIS — Z0000 Encounter for general adult medical examination without abnormal findings: Secondary | ICD-10-CM

## 2022-02-10 NOTE — Patient Instructions (Signed)
Eric [name] , ?Thank you for taking time to come for your Medicare Wellness Visit. I appreciate your ongoing commitment to your health goals. Please review the following plan we discussed and let me know if I can assist you in the future.  ? ?Screening recommendations/referrals: ?Colonoscopy: no longer required ?Recommended yearly ophthalmology/optometry visit for glaucoma screening and checkup ?Recommended yearly dental visit for hygiene and checkup ? ?Vaccinations: ?Influenza vaccine: done 09/03/21 ?Pneumococcal vaccine: done 04/15/14 ?Tdap vaccine: due ?Shingles vaccine: Shingrix discussed. Please contact your pharmacy for coverage information.  ?Covid-19: done 12/02/19 & 01/01/20 ? ?Advanced directives: Advance directive discussed with you today. Even though you declined this today please call our office should you change your mind and we can give you the proper paperwork for you to fill out.  ? ?Conditions/risks identified: Recommend drinking 6-8 glasses of water per day  ? ?Next appointment: Follow up in one year for your annual wellness visit.  ? ?Preventive Care 80 Years and Older, Male ?Preventive care refers to lifestyle choices and visits with your health care provider that can promote health and wellness. ?What does preventive care include? ?A yearly physical exam. This is also called an annual well check. ?Dental exams once or twice a year. ?Routine eye exams. Ask your health care provider how often you should have your eyes checked. ?Personal lifestyle choices, including: ?Daily care of your teeth and gums. ?Regular physical activity. ?Eating a healthy diet. ?Avoiding tobacco and drug use. ?Limiting alcohol use. ?Practicing safe sex. ?Taking low doses of aspirin every day. ?Taking vitamin and mineral supplements as recommended by your health care provider. ?What happens during an annual well check? ?The services and screenings done by your health care provider during your annual well check will depend on  your age, overall health, lifestyle risk factors, and family history of disease. ?Counseling  ?Your health care provider may ask you questions about your: ?Alcohol use. ?Tobacco use. ?Drug use. ?Emotional well-being. ?Home and relationship well-being. ?Sexual activity. ?Eating habits. ?History of falls. ?Memory and ability to understand (cognition). ?Work and work Statistician. ?Screening  ?You may have the following tests or measurements: ?Height, weight, and BMI. ?Blood pressure. ?Lipid and cholesterol levels. These may be checked every 5 years, or more frequently if you are over 80 years old. ?Skin check. ?Lung cancer screening. You may have this screening every year starting at age 80 if you have a 30-pack-year history of smoking and currently smoke or have quit within the past 15 years. ?Fecal occult blood test (FOBT) of the stool. You may have this test every year starting at age 80. ?Flexible sigmoidoscopy or colonoscopy. You may have a sigmoidoscopy every 5 years or a colonoscopy every 10 years starting at age 80. ?Prostate cancer screening. Recommendations will vary depending on your family history and other risks. ?Hepatitis C blood test. ?Hepatitis B blood test. ?Sexually transmitted disease (STD) testing. ?Diabetes screening. This is done by checking your blood sugar (glucose) after you have not eaten for a while (fasting). You may have this done every 1-3 years. ?Abdominal aortic aneurysm (AAA) screening. You may need this if you are a current or former smoker. ?Osteoporosis. You may be screened starting at age 80 if you are at high risk. ?Talk with your health care provider about your test results, treatment options, and if necessary, the need for more tests. ?Vaccines  ?Your health care provider may recommend certain vaccines, such as: ?Influenza vaccine. This is recommended every year. ?Tetanus, diphtheria, and acellular pertussis (  Tdap, Td) vaccine. You may need a Td booster every 10 years. ?Zoster  vaccine. You may need this after age 20. ?Pneumococcal 13-valent conjugate (PCV13) vaccine. One dose is recommended after age 80. ?Pneumococcal polysaccharide (PPSV23) vaccine. One dose is recommended after age 80. ?Talk to your health care provider about which screenings and vaccines you need and how often you need them. ?This information is not intended to replace advice given to you by your health care provider. Make sure you discuss any questions you have with your health care provider. ?Document Released: 11/06/2015 Document Revised: 06/29/2016 Document Reviewed: 08/11/2015 ?Elsevier Interactive Patient Education ? 2017 [employer]. ? ?Fall Prevention in the Home ?Falls can cause injuries. They can happen to people of all ages. There are many things you can do to make your home safe and to help prevent falls. ?What can I do on the outside of my home? ?Regularly fix the edges of walkways and driveways and fix any cracks. ?Remove anything that might make you trip as you walk through a door, such as a raised step or threshold. ?Trim any bushes or trees on the path to your home. ?Use bright outdoor lighting. ?Clear any walking paths of anything that might make someone trip, such as rocks or tools. ?Regularly check to see if handrails are loose or broken. Make sure that both sides of any steps have handrails. ?Any raised decks and porches should have guardrails on the edges. ?Have any leaves, snow, or ice cleared regularly. ?Use sand or salt on walking paths during winter. ?Clean up any spills in your garage right away. This includes oil or grease spills. ?What can I do in the bathroom? ?Use night lights. ?Install grab bars by the toilet and in the tub and shower. Do not use towel bars as grab bars. ?Use non-skid mats or decals in the tub or shower. ?If you need to sit down in the shower, use a plastic, non-slip stool. ?Keep the floor dry. Clean up any water that spills on the floor as soon as it happens. ?Remove  soap buildup in the tub or shower regularly. ?Attach bath mats securely with double-sided non-slip rug tape. ?Do not have throw rugs and other things on the floor that can make you trip. ?What can I do in the bedroom? ?Use night lights. ?Make sure that you have a light by your bed that is easy to reach. ?Do not use any sheets or blankets that are too big for your bed. They should not hang down onto the floor. ?Have a firm chair that has side arms. You can use this for support while you get dressed. ?Do not have throw rugs and other things on the floor that can make you trip. ?What can I do in the kitchen? ?Clean up any spills right away. ?Avoid walking on wet floors. ?Keep items that you use a lot in easy-to-reach places. ?If you need to reach something above you, use a strong step stool that has a grab bar. ?Keep electrical cords out of the way. ?Do not use floor polish or wax that makes floors slippery. If you must use wax, use non-skid floor wax. ?Do not have throw rugs and other things on the floor that can make you trip. ?What can I do with my stairs? ?Do not leave any items on the stairs. ?Make sure that there are handrails on both sides of the stairs and use them. Fix handrails that are broken or loose. Make sure that handrails are  as long as the stairways. ?Check any carpeting to make sure that it is firmly attached to the stairs. Fix any carpet that is loose or worn. ?Avoid having throw rugs at the top or bottom of the stairs. If you do have throw rugs, attach them to the floor with carpet tape. ?Make sure that you have a light switch at the top of the stairs and the bottom of the stairs. If you do not have them, ask someone to add them for you. ?What else can I do to help prevent falls? ?Wear shoes that: ?Do not have high heels. ?Have rubber bottoms. ?Are comfortable and fit you well. ?Are closed at the toe. Do not wear sandals. ?If you use a stepladder: ?Make sure that it is fully opened. Do not climb a  closed stepladder. ?Make sure that both sides of the stepladder are locked into place. ?Ask someone to hold it for you, if possible. ?Clearly mark and make sure that you can see: ?Any grab bars or handra

## 2022-02-10 NOTE — Progress Notes (Signed)
? ?Subjective:  ? Eric [name] is a 81 y.o. male who presents for Medicare Annual/Subsequent preventive examination. ? ?Virtual Visit via Telephone Note ? ?I connected with  Eric [name] on 02/10/22 at  8:15 AM EDT by telephone and verified that I am speaking with the correct person using two identifiers. ? ?Location: ?Patient: home ?Provider: [facility] ?Persons participating in the virtual visit: patient/Nurse Health Advisor ?  ?I discussed the limitations, risks, security and privacy concerns of performing an evaluation and management service by telephone and the availability of in person appointments. The patient expressed understanding and agreed to proceed. ? ?Interactive audio and video telecommunications were attempted between this nurse and patient, however failed, due to patient having technical difficulties OR patient did not have access to video capability.  We continued and completed visit with audio only. ? ?Some vital signs may be absent or patient reported.  ? ?Clemetine Marker, LPN ? ? ?Review of Systems    ? ?Cardiac Risk Factors include: advanced age (>28mn, >>69women);dyslipidemia;male gender;hypertension;smoking/ tobacco exposure;sedentary lifestyle ? ?   ?Objective:  ?  ?There were no vitals filed for this visit. ?There is no height or weight on file to calculate BMI. ? ? ?  02/10/2022  ?  8:25 AM 02/02/2022  ?  8:25 AM 01/19/2022  ? 11:26 AM 01/12/2022  ?  8:45 AM 12/27/2021  ? 10:57 AM 12/20/2021  ?  1:16 PM 12/13/2021  ?  1:38 PM  ?Advanced Directives  ?Does Patient Have a Medical Advance Directive? No No No No No No No  ?Would patient like information on creating a medical advance directive? No - Patient declined No - Patient declined No - Patient declined No - Patient declined No - Patient declined No - Patient declined Yes (MAU/Ambulatory/Procedural Areas - Information given)  ? ? ?Current Medications (verified) ?Outpatient Encounter Medications as of 02/10/2022  ?Medication Sig  ? acetaminophen  (TYLENOL) 500 MG tablet Take 1 tablet (500 mg total) by mouth every 6 (six) hours as needed.  ? Ca Carbonate-Mag Hydroxide (ROLAIDS PO) Take 1 tablet by mouth as needed.  ? darolutamide (NUBEQA) 300 MG tablet Take 600 mg by mouth 2 (two) times daily with a meal.  ? dexamethasone (DECADRON) 4 MG tablet Take one pill AM & PM x 2 days-TAKE the day prior to chemo; and the day after chemo; do not take on the day of chemo.  ? ezetimibe (ZETIA) 10 MG tablet Take 1 tablet (10 mg total) by mouth daily. (Patient taking differently: Take 10 mg by mouth every morning.)  ? Ipratropium-Albuterol (COMBIVENT) 20-100 MCG/ACT AERS respimat Inhale 1 puff into the lungs every 6 (six) hours.  ? lisinopril (ZESTRIL) 5 MG tablet Take 1 tablet (5 mg total) by mouth daily. For bp and kidney protection  ? meclizine (ANTIVERT) 25 MG tablet TAKE 1 TABLET (25 MG TOTAL) BY MOUTH DAILY AS NEEDED FOR DIZZINESS.  ? metoprolol succinate (TOPROL-XL) 25 MG 24 hr tablet Take 1 tablet (25 mg total) by mouth daily. Take with or immediately following a meal. (Patient taking differently: Take 25 mg by mouth every morning. Take with or immediately following a meal.)  ? ondansetron (ZOFRAN) 8 MG tablet One pill every 8 hours as needed for nausea/vomitting.  ? prochlorperazine (COMPAZINE) 10 MG tablet Take 1 tablet (10 mg total) by mouth every 6 (six) hours as needed for nausea or vomiting.  ? tamsulosin (FLOMAX) 0.4 MG CAPS capsule Take 1 capsule (0.4 mg total) by mouth  daily after supper.  ? rosuvastatin (CRESTOR) 40 MG tablet Take 1 tablet (40 mg total) by mouth daily. (Patient not taking: Reported on 02/10/2022)  ? ?No facility-administered encounter medications on file as of 02/10/2022.  ? ? ?Allergies (verified) ?Patient has no known allergies.  ? ?History: ?Past Medical History:  ?Diagnosis Date  ? COPD (chronic obstructive pulmonary disease) ([place])   ? Dyspnea   ? with exertion  ? Family history of cancer   ? GERD (gastroesophageal reflux disease)   ?  occ  ? Hyperlipidemia   ? Hypertension   ? ?Past Surgical History:  ?Procedure Laterality Date  ? COLONOSCOPY    ? HEMORROIDECTOMY    ? IR IMAGING GUIDED PORT INSERTION  12/20/2021  ? PROSTATE BIOPSY N/A 10/26/2021  ? Procedure: PROSTATE BIOPSY;  Surgeon: Abbie Sons, MD;  Location: [facility] ORS;  Service: Urology;  Laterality: N/A;  ? TRANSRECTAL ULTRASOUND N/A 10/26/2021  ? Procedure: TRANSRECTAL ULTRASOUND;  Surgeon: Abbie Sons, MD;  Location: [facility] ORS;  Service: Urology;  Laterality: N/A;  ? ?Family History  ?Problem Relation Age of Onset  ? Heart disease Mother   ? COPD Father   ? Cancer Brother 20  ?     blood cancer; unk exact type  ? Cancer Maternal Aunt   ?     unk type  ? Cancer Paternal Aunt   ?     unk type  ? Cancer Paternal Uncle   ?     one unk type; one had throat  ? Cancer Cousin   ?     unk types  ? ?Social History  ? ?Socioeconomic History  ? Marital status: Single  ?  Spouse name: Not on file  ? Number of children: 1  ? Years of education: Not on file  ? Highest education level: High school graduate  ?Occupational History  ? Occupation: retired   ?Tobacco Use  ? Smoking status: Every Day  ?  Packs/day: 0.50  ?  Years: 60.00  ?  Pack years: 30.00  ?  Types: Cigarettes  ? Smokeless tobacco: Never  ? Tobacco comments:  ?  smoking cessation information provided  ?Vaping Use  ? Vaping Use: Never used  ?Substance and Sexual Activity  ? Alcohol use: Not Currently  ? Drug use: Not Currently  ? Sexual activity: Not Currently  ?Other Topics Concern  ? Not on file  ?Social History Narrative  ? Single, lives with male friend Abbott Pao is care giver] for the past 20 plus years. 1/2 ppd smoke; no alcohol. 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 Daughter lives 10-15 mins; in [place].   ? ?Social Determinants of Health  ? ?Financial Resource Strain: Low Risk   ? Difficulty of Paying Living Expenses: Not hard at all  ?Food Insecurity: No Food Insecurity  ? Worried About Charity fundraiser in the  Last Year: Never true  ? Ran Out of Food in the Last Year: Never true  ?Transportation Needs: No Transportation Needs  ? Lack of Transportation (Medical): No  ? Lack of Transportation (Non-Medical): No  ?Physical Activity: Inactive  ? Days of Exercise per Week: 0 days  ? Minutes of Exercise per Session: 0 min  ?Stress: No Stress Concern Present  ? Feeling of Stress : Only a little  ?Social Connections: Socially Isolated  ? Frequency of Communication with Friends and Family: More than three times a week  ? Frequency of Social Gatherings with Friends and  Family: Three times a week  ? Attends Religious Services: Never  ? Active Member of Clubs or Organizations: No  ? Attends Archivist Meetings: Never  ? Marital Status: Widowed  ? ? ?Tobacco Counseling ?Ready to quit: Not Answered ?Counseling given: Not Answered ?Tobacco comments: smoking cessation information provided ? ? ?Clinical Intake: ? ?Pre-visit preparation completed: Yes ? ?Pain : No/denies pain ? ?  ? ?Nutritional Risks: Nausea/ vomitting/ diarrhea (with chemo treatment) ?Diabetes: No ? ?How often do you need to have someone help you when you read instructions, pamphlets, or other written materials from your doctor or pharmacy?: 1 - Never ? ? ? ?Interpreter Needed?: No ? ?Information entered by :: Clemetine Marker LPN ? ? ?Activities of Daily Living ? ?  02/10/2022  ?  8:27 AM 12/20/2021  ?  1:15 PM  ?In your present state of health, do you have any difficulty performing the following activities:  ?Hearing? 1 0  ?Vision? 0 0  ?Difficulty concentrating or making decisions? 0 0  ?Walking or climbing stairs? 0 1  ?Dressing or bathing? 0 0  ?Doing errands, shopping? 0   ?Preparing Food and eating ? N   ?Using the Toilet? N   ?In the past six months, have you accidently leaked urine? N   ?Do you have problems with loss of bowel control? N   ?Managing your Medications? N   ?Managing your Finances? N   ?Housekeeping or managing your Housekeeping? N   ? ? ?Patient  Care Team: ?Steele Sizer, MD as PCP - General (Family Medicine) ?Cammie Sickle, MD as Consulting Physician (Oncology) ?Stoioff, Ronda Fairly, MD (Urology) ? ?Indicate any recent Medical Services

## 2022-02-11 ENCOUNTER — Inpatient Hospital Stay: Payer: Medicare HMO

## 2022-02-11 VITALS — BP 114/64 | HR 102 | Temp 97.2°F | Resp 19

## 2022-02-11 DIAGNOSIS — K1230 Oral mucositis (ulcerative), unspecified: Secondary | ICD-10-CM | POA: Diagnosis not present

## 2022-02-11 DIAGNOSIS — Z7952 Long term (current) use of systemic steroids: Secondary | ICD-10-CM | POA: Diagnosis not present

## 2022-02-11 DIAGNOSIS — Z5111 Encounter for antineoplastic chemotherapy: Secondary | ICD-10-CM | POA: Diagnosis not present

## 2022-02-11 DIAGNOSIS — Z5189 Encounter for other specified aftercare: Secondary | ICD-10-CM | POA: Diagnosis not present

## 2022-02-11 DIAGNOSIS — C61 Malignant neoplasm of prostate: Secondary | ICD-10-CM | POA: Diagnosis not present

## 2022-02-11 DIAGNOSIS — N400 Enlarged prostate without lower urinary tract symptoms: Secondary | ICD-10-CM | POA: Diagnosis not present

## 2022-02-11 DIAGNOSIS — C7951 Secondary malignant neoplasm of bone: Secondary | ICD-10-CM | POA: Diagnosis not present

## 2022-02-11 DIAGNOSIS — R591 Generalized enlarged lymph nodes: Secondary | ICD-10-CM | POA: Diagnosis not present

## 2022-02-11 LAB — CBC WITH DIFFERENTIAL/PLATELET
Abs Immature Granulocytes: 3.01 10*3/uL — ABNORMAL HIGH (ref 0.00–0.07)
Basophils Absolute: 0 10*3/uL (ref 0.0–0.1)
Basophils Relative: 0 %
Eosinophils Absolute: 0 10*3/uL (ref 0.0–0.5)
Eosinophils Relative: 0 %
HCT: 31.1 % — ABNORMAL LOW (ref 39.0–52.0)
Hemoglobin: 10 g/dL — ABNORMAL LOW (ref 13.0–17.0)
Immature Granulocytes: 8 %
Lymphocytes Relative: 7 %
Lymphs Abs: 2.7 10*3/uL (ref 0.7–4.0)
MCH: 31.8 pg (ref 26.0–34.0)
MCHC: 32.2 g/dL (ref 30.0–36.0)
MCV: 99 fL (ref 80.0–100.0)
Monocytes Absolute: 2.1 10*3/uL — ABNORMAL HIGH (ref 0.1–1.0)
Monocytes Relative: 6 %
Neutro Abs: 28.4 10*3/uL — ABNORMAL HIGH (ref 1.7–7.7)
Neutrophils Relative %: 79 %
Platelets: 234 10*3/uL (ref 150–400)
RBC: 3.14 MIL/uL — ABNORMAL LOW (ref 4.22–5.81)
RDW: 15.9 % — ABNORMAL HIGH (ref 11.5–15.5)
Smear Review: NORMAL
WBC: 36.2 10*3/uL — ABNORMAL HIGH (ref 4.0–10.5)
nRBC: 0.5 % — ABNORMAL HIGH (ref 0.0–0.2)

## 2022-02-11 LAB — BASIC METABOLIC PANEL
Anion gap: 8 (ref 5–15)
BUN: 16 mg/dL (ref 8–23)
CO2: 21 mmol/L — ABNORMAL LOW (ref 22–32)
Calcium: 8.3 mg/dL — ABNORMAL LOW (ref 8.9–10.3)
Chloride: 104 mmol/L (ref 98–111)
Creatinine, Ser: 1.35 mg/dL — ABNORMAL HIGH (ref 0.61–1.24)
GFR, Estimated: 53 mL/min — ABNORMAL LOW (ref >60)
Glucose, Bld: 115 mg/dL — ABNORMAL HIGH (ref 70–99)
Potassium: 4.1 mmol/L (ref 3.5–5.1)
Sodium: 133 mmol/L — ABNORMAL LOW (ref 135–145)

## 2022-02-11 LAB — VITAMIN D 25 HYDROXY (VIT D DEFICIENCY, FRACTURES): Vit D, 25-Hydroxy: 48.61 ng/mL (ref 30–100)

## 2022-02-11 MED ORDER — SODIUM CHLORIDE 0.9 % IV SOLN
Freq: Once | INTRAVENOUS | Status: AC
  Filled 2022-02-11: qty 250

## 2022-02-11 MED ORDER — HEPARIN SOD (PORK) LOCK FLUSH 100 UNIT/ML IV SOLN
500.0000 [IU] | Freq: Once | INTRAVENOUS | Status: AC
  Filled 2022-02-11: qty 5

## 2022-02-11 MED ORDER — HEPARIN SOD (PORK) LOCK FLUSH 100 UNIT/ML IV SOLN
INTRAVENOUS | Status: AC
  Administered 2022-02-11: 500 [IU] via INTRAVENOUS
  Filled 2022-02-11: qty 5

## 2022-02-11 MED ORDER — DEGARELIX ACETATE 80 MG ~~LOC~~ SOLR
80.0000 mg | Freq: Once | SUBCUTANEOUS | Status: AC
  Administered 2022-02-11: 80 mg via SUBCUTANEOUS
  Filled 2022-02-11: qty 4

## 2022-02-11 MED ORDER — SODIUM CHLORIDE 0.9% FLUSH
10.0000 mL | Freq: Once | INTRAVENOUS | Status: AC
  Administered 2022-02-11: 10 mL via INTRAVENOUS
  Filled 2022-02-11: qty 10

## 2022-02-11 NOTE — Progress Notes (Signed)
Nutrition Follow-up: ? ? ?Patient with metastatic prostate cancer with bone mets.  Patient on firmagon, docetaxel, darolutamide.   ? ?Met with patient during infusion.  Patient reports that yesterday appetite was good but prior to that for several days appetite was poor due to swollen tongue, no taste.  Has been using mouthwash.  Ate fish, french fries and hushpuppies yesterday. Drinks ensure shakes 1-3 per day.  Has not had a smoothie recently.   ? ? ? ?Medications: reviewed ? ?Labs: Na 133, glucose 115, creatinine 1.35, Cal 8.3 ? ?Anthropometrics:  ? ?No new weight today ?151 lb 02/02/22 ?149 lb 3/22 ?151 lb on 1/18 ? ?NUTRITION DIAGNOSIS: Food and nutrition related knowledge deficit improved.  ?Inadequate food intake related to cancer treatment as evidenced by decreased oral intake ? ? ?INTERVENTION:  ?Encouraged oral nutrition supplements and smooth, creamy foods with mouth sores.  ?Vitamin D level drawn today.  ? ?  ? ?MONITORING, EVALUATION, GOAL: weight trends, intake ? ? ?NEXT VISIT: to be determined with treatment ? ?Joslyne Marshburn B. Zenia Resides, RD, LDN ?Registered Dietitian ?[phone] ? ? ?

## 2022-02-23 ENCOUNTER — Inpatient Hospital Stay: Payer: Medicare HMO | Attending: Internal Medicine

## 2022-02-23 ENCOUNTER — Encounter: Payer: Self-pay | Admitting: Internal Medicine

## 2022-02-23 ENCOUNTER — Inpatient Hospital Stay: Payer: Medicare HMO

## 2022-02-23 ENCOUNTER — Inpatient Hospital Stay (HOSPITAL_BASED_OUTPATIENT_CLINIC_OR_DEPARTMENT_OTHER): Payer: Medicare HMO | Admitting: Internal Medicine

## 2022-02-23 VITALS — BP 136/64 | HR 76

## 2022-02-23 DIAGNOSIS — Z5111 Encounter for antineoplastic chemotherapy: Secondary | ICD-10-CM | POA: Insufficient documentation

## 2022-02-23 DIAGNOSIS — C61 Malignant neoplasm of prostate: Secondary | ICD-10-CM | POA: Diagnosis not present

## 2022-02-23 DIAGNOSIS — Z5189 Encounter for other specified aftercare: Secondary | ICD-10-CM | POA: Diagnosis not present

## 2022-02-23 DIAGNOSIS — C7951 Secondary malignant neoplasm of bone: Secondary | ICD-10-CM | POA: Insufficient documentation

## 2022-02-23 LAB — COMPREHENSIVE METABOLIC PANEL
ALT: 12 U/L (ref 0–44)
AST: 15 U/L (ref 15–41)
Albumin: 3.3 g/dL — ABNORMAL LOW (ref 3.5–5.0)
Alkaline Phosphatase: 237 U/L — ABNORMAL HIGH (ref 38–126)
Anion gap: 7 (ref 5–15)
BUN: 22 mg/dL (ref 8–23)
CO2: 22 mmol/L (ref 22–32)
Calcium: 9.2 mg/dL (ref 8.9–10.3)
Chloride: 105 mmol/L (ref 98–111)
Creatinine, Ser: 0.99 mg/dL (ref 0.61–1.24)
GFR, Estimated: >60 mL/min (ref >60)
Glucose, Bld: 133 mg/dL — ABNORMAL HIGH (ref 70–99)
Potassium: 4.6 mmol/L (ref 3.5–5.1)
Sodium: 134 mmol/L — ABNORMAL LOW (ref 135–145)
Total Bilirubin: 0.6 mg/dL (ref 0.3–1.2)
Total Protein: 6.7 g/dL (ref 6.5–8.1)

## 2022-02-23 LAB — CBC WITH DIFFERENTIAL/PLATELET
Abs Immature Granulocytes: 0.05 10*3/uL (ref 0.00–0.07)
Basophils Absolute: 0 10*3/uL (ref 0.0–0.1)
Basophils Relative: 0 %
Eosinophils Absolute: 0 10*3/uL (ref 0.0–0.5)
Eosinophils Relative: 0 %
HCT: 30.5 % — ABNORMAL LOW (ref 39.0–52.0)
Hemoglobin: 10 g/dL — ABNORMAL LOW (ref 13.0–17.0)
Immature Granulocytes: 0 %
Lymphocytes Relative: 11 %
Lymphs Abs: 1.2 10*3/uL (ref 0.7–4.0)
MCH: 32.4 pg (ref 26.0–34.0)
MCHC: 32.8 g/dL (ref 30.0–36.0)
MCV: 98.7 fL (ref 80.0–100.0)
Monocytes Absolute: 0.5 10*3/uL (ref 0.1–1.0)
Monocytes Relative: 5 %
Neutro Abs: 9.4 10*3/uL — ABNORMAL HIGH (ref 1.7–7.7)
Neutrophils Relative %: 84 %
Platelets: 407 10*3/uL — ABNORMAL HIGH (ref 150–400)
RBC: 3.09 MIL/uL — ABNORMAL LOW (ref 4.22–5.81)
RDW: 16 % — ABNORMAL HIGH (ref 11.5–15.5)
WBC: 11.1 10*3/uL — ABNORMAL HIGH (ref 4.0–10.5)
nRBC: 0 % (ref 0.0–0.2)

## 2022-02-23 LAB — PSA: Prostatic Specific Antigen: 33.31 ng/mL — ABNORMAL HIGH (ref 0.00–4.00)

## 2022-02-23 MED ORDER — SODIUM CHLORIDE 0.9 % IV SOLN
10.0000 mg | Freq: Once | INTRAVENOUS | Status: AC
  Administered 2022-02-23: 10 mg via INTRAVENOUS
  Filled 2022-02-23: qty 10

## 2022-02-23 MED ORDER — ONDANSETRON HCL 4 MG/2ML IJ SOLN
8.0000 mg | Freq: Once | INTRAMUSCULAR | Status: AC
  Administered 2022-02-23: 8 mg via INTRAVENOUS
  Filled 2022-02-23: qty 4

## 2022-02-23 MED ORDER — SODIUM CHLORIDE 0.9 % IV SOLN
75.0000 mg/m2 | Freq: Once | INTRAVENOUS | Status: AC
  Administered 2022-02-23: 130 mg via INTRAVENOUS
  Filled 2022-02-23: qty 13

## 2022-02-23 MED ORDER — HEPARIN SOD (PORK) LOCK FLUSH 100 UNIT/ML IV SOLN
500.0000 [IU] | Freq: Once | INTRAVENOUS | Status: AC | PRN
  Administered 2022-02-23: 500 [IU]
  Filled 2022-02-23: qty 5

## 2022-02-23 MED ORDER — SODIUM CHLORIDE 0.9 % IV SOLN
Freq: Once | INTRAVENOUS | Status: AC
  Filled 2022-02-23: qty 250

## 2022-02-23 MED ORDER — HEPARIN SOD (PORK) LOCK FLUSH 100 UNIT/ML IV SOLN
500.0000 [IU] | Freq: Once | INTRAVENOUS | Status: DC
  Filled 2022-02-23: qty 5

## 2022-02-23 MED ORDER — SODIUM CHLORIDE 0.9% FLUSH
10.0000 mL | Freq: Once | INTRAVENOUS | Status: AC
  Administered 2022-02-23: 10 mL via INTRAVENOUS
  Filled 2022-02-23: qty 10

## 2022-02-23 MED ORDER — LIDOCAINE-PRILOCAINE 2.5-2.5 % EX CREA: TOPICAL_CREAM | CUTANEOUS | 3 refills | Status: DC

## 2022-02-23 NOTE — Assessment & Plan Note (Addendum)
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 MRI- liver-1st-March 2023 hemangioma.  #1 loading [place] Koller 12/17/2021.  Taxotere plus ADT.  PSA improving ? ?#Proceed with cycle # 3 of Taxotere [60 mg per metered square]. Labs today reviewed;  acceptable for treatment today.  Also on  darolutamide today; 300 mg- 2 pills BID.  ? ?# Mucositis:G-1 continue salt/baking soda rinses.  ?  ?#Prostatism symptoms: ? Flomax; discussed regarding orthostasis with Flomax.  ? ?# Weight loss/malnutrition-evaluation with nutrition- STABLE.  ? ?# IV access: functional refilled- emla cream.  ? ?# DISPOSITION:   ?#Taxotere today; D-2 Ellen Henri ?#- in 10 days- cbc/bmp; possible IVFs- 1 lit; dex 8 mg.  ?# follow up in 3 weeks- MD; labs-cbc/cmp; PSA; taxoetre;d-2 udenyca/ [place] Koller - Dr.B  ? ? ?

## 2022-02-23 NOTE — Patient Instructions (Signed)
[facility]  Discharge Instructions: °Thank you for choosing [facility] to provide your oncology and hematology care.  ° °If you have a lab appointment with the Cancer Center, please go directly to the Cancer Center and check in at the registration area. °  °Wear comfortable clothing and clothing appropriate for easy access to any Portacath or PICC line.  ° °We strive to give you quality time with your provider. You may need to reschedule your appointment if you arrive late (15 or more minutes).  Arriving late affects you and other patients whose appointments are after yours.  Also, if you miss three or more appointments without notifying the office, you may be dismissed from the clinic at the provider’s discretion.    °  °For prescription refill requests, have your pharmacy contact our office and allow 72 hours for refills to be completed.   ° °Today you received the following chemotherapy and/or immunotherapy agents     °  °To help prevent nausea and vomiting after your treatment, we encourage you to take your nausea medication as directed. ° °BELOW ARE SYMPTOMS THAT SHOULD BE REPORTED IMMEDIATELY: °*FEVER GREATER THAN 100.4 F (38 °C) OR HIGHER °*CHILLS OR SWEATING °*NAUSEA AND VOMITING THAT IS NOT CONTROLLED WITH YOUR NAUSEA MEDICATION °*UNUSUAL SHORTNESS OF BREATH °*UNUSUAL BRUISING OR BLEEDING °*URINARY PROBLEMS (pain or burning when urinating, or frequent urination) °*BOWEL PROBLEMS (unusual diarrhea, constipation, pain near the anus) °TENDERNESS IN MOUTH AND THROAT WITH OR WITHOUT PRESENCE OF ULCERS (sore throat, sores in mouth, or a toothache) °UNUSUAL RASH, SWELLING OR PAIN  °UNUSUAL VAGINAL DISCHARGE OR ITCHING  ° °Items with * indicate a potential emergency and should be followed up as soon as possible or go to the Emergency Department if any problems should occur. ° °Please show the CHEMOTHERAPY ALERT CARD or IMMUNOTHERAPY ALERT CARD at check-in to the  Emergency Department and triage nurse. ° °Should you have questions after your visit or need to cancel or reschedule your appointment, please contact [facility]  Dept: [phone]  and follow the prompts.  Office hours are 8:00 a.m. to 4:30 p.m. Monday - Friday. Please note that voicemails left after 4:00 p.m. may not be returned until the following business day.  We are closed weekends and major holidays. You have access to a nurse at all times for urgent questions. Please call the main number to the clinic Dept: [phone] and follow the prompts. ° ° °For any non-urgent questions, you may also contact your provider using MyChart. We now offer e-Visits for anyone 18 and older to request care online for non-urgent symptoms. For details visit [email or web address]. °  °Also download the MyChart app! Go to the app store, search "MyChart", open the app, select [facility], and log in with your MyChart username and password. ° °Due to Covid, a mask is required upon entering the hospital/clinic. If you do not have a mask, one will be given to you upon arrival. For doctor visits, patients may have 1 support person aged 18 or older with them. For treatment visits, patients cannot have anyone with them due to current Covid guidelines and our immunocompromised population.  ° °

## 2022-02-23 NOTE — Progress Notes (Signed)
Is there a cheaper alternative to the combivent? ? ?Would like to know if treatment is working. ?

## 2022-02-23 NOTE — Progress Notes (Signed)
[address] [place] ?OFFICE PROGRESS NOTE ? ?Patient Care Team: ?Steele Sizer, MD as PCP - General (Family Medicine) ?Cammie Sickle, MD as Consulting Physician (Oncology) ?Stoioff, Ronda Fairly, MD (Urology) ? ? Cancer Staging  ?Prostate cancer ([place]) ?Staging form: Prostate, AJCC 8th Edition ?- Clinical: Stage IVB (cT2c, cN1, cM1, PSA: 374, Grade Group: 5) - Signed by Cammie Sickle, MD on 12/13/2021 ?Prostate specific antigen (PSA) range: 20 or greater ?Histologic grading system: 5 grade system ? ? ? ?Oncology History Overview Note  ?compatible with widespread metastatic prostate ?cancer, including innumerable sclerotic osseous lesions and ?extensive lymphadenopathy in the pelvis and retroperitoneum, as ?detailed above. This includes lymphadenopathy in the upper right ?hemipelvis which likely causes severe stenosis or complete occlusion ?of the right common iliac vein. ?2. There is also an indeterminate hypovascular lesion in segment 5 ?of the liver which may represent a metastatic lesion. This could be ?better characterized with follow-up abdominal MRI with and without ?IV gadolinium if clinically appropriate. ?3. Aortic atherosclerosis with mild fusiform aneurysmal dilatation ?of the infrarenal abdominal aorta measuring up to 3.2 x 3.0 cm. ?Recommend follow-up ultrasound every 3 years. This recommendation ?follows ACR consensus guidelines: White Paper of the ACR Incidental ?Findings Committee II on Vascular Findings. J Am Coll Radiol 2013; ?32:440-102. ?4. Bladder wall appears mildly thickened, likely related to bladder ?outlet obstruction given the enlarged prostate gland. ?5. Severe colonic diverticulosis without evidence of acute ?diverticulitis at this time. ? ?IMPRESSION: ?There are numerous foci of abnormal tracer uptake in the axial and ?proximal appendicular skeleton consistent with extensive skeletal ?metastatic disease. ?  ? ? ?#Stage IV castrate sensitive prostate cancer-December/20  2023-MRI negative for any liver lesions.; FEB 24th, 2022-Firmagon loading dose; ? ?# march 24th-Taxoeter with udenyca; ?4/12- add darolutamide ? ?# DEC 2022- 725 [Stoioff; Urology] ?  ?Prostate cancer ([place])  ?12/13/2021 Initial Diagnosis  ? Prostate cancer [facility]) ?  ?12/13/2021 Cancer Staging  ? Staging form: Prostate, AJCC 8th Edition ?- Clinical: Stage IVB (cT2c, cN1, cM1, PSA: 374, Grade Group: 5) - Signed by Cammie Sickle, MD on 12/13/2021 ?Prostate specific antigen (PSA) range: 20 or greater ?Histologic grading system: 5 grade system ? ?  ?01/12/2022 -  Chemotherapy  ? Patient is on Treatment Plan : PROSTATE Docetaxel + Prednisone q21d  ? ?  ?  ? ?  ?HISTORY OF PRESENT ILLNESS: Ambulating independently  with daughter.  ?  ?Eric [name] 81 y.o.  male pleasant patient above history of castrate sensitive metastatic prostate cancer currently on Taxotere chemotherapy + Darolutamide is here for follow-up. ? ?Patient complains of fingernail changes hyperpigmentation. ? ?Denies any swelling in the legs.  Denies any significant diarrhea.  No nausea no vomiting.  Oral ulcers improved with Magic mouthwash. As per the patient's daughter patient had mild nausea/also mild sores in the mouth which improved with Magic mouthwash.   No tingling or numbness.  No falls. ? ?Review of Systems  ?Constitutional:  Positive for malaise/fatigue and weight loss. Negative for chills, diaphoresis and fever.  ?HENT:  Negative for nosebleeds and sore throat.   ?Eyes:  Negative for double vision.  ?Respiratory:  Negative for cough, hemoptysis, sputum production, shortness of breath and wheezing.   ?Cardiovascular:  Negative for chest pain, palpitations, orthopnea and leg swelling.  ?Gastrointestinal:  Negative for abdominal pain, blood in stool, constipation, diarrhea, heartburn, melena, nausea and vomiting.  ?Genitourinary:  Negative for dysuria, frequency and urgency.  ?Musculoskeletal:  Positive for back pain and joint pain.  ?  Skin:  Negative.  Negative for itching and rash.  ?Neurological:  Negative for dizziness, tingling, focal weakness, weakness and headaches.  ?Endo/Heme/Allergies:  Does not bruise/bleed easily.  ?Psychiatric/Behavioral:  Negative for depression. The patient is not nervous/anxious and does not have insomnia.   ?  ? ?PAST MEDICAL HISTORY :  ?Past Medical History:  ?Diagnosis Date  ? COPD (chronic obstructive pulmonary disease) ([place])   ? Dyspnea   ? with exertion  ? Family history of cancer   ? GERD (gastroesophageal reflux disease)   ? occ  ? Hyperlipidemia   ? Hypertension   ? ? ?PAST SURGICAL HISTORY :   ?Past Surgical History:  ?Procedure Laterality Date  ? COLONOSCOPY    ? HEMORROIDECTOMY    ? IR IMAGING GUIDED PORT INSERTION  12/20/2021  ? PROSTATE BIOPSY N/A 10/26/2021  ? Procedure: PROSTATE BIOPSY;  Surgeon: Abbie Sons, MD;  Location: [facility] ORS;  Service: Urology;  Laterality: N/A;  ? TRANSRECTAL ULTRASOUND N/A 10/26/2021  ? Procedure: TRANSRECTAL ULTRASOUND;  Surgeon: Abbie Sons, MD;  Location: [facility] ORS;  Service: Urology;  Laterality: N/A;  ? ? ?FAMILY HISTORY :   ?Family History  ?Problem Relation Age of Onset  ? Heart disease Mother   ? COPD Father   ? Cancer Brother 23  ?     blood cancer; unk exact type  ? Cancer Maternal Aunt   ?     unk type  ? Cancer Paternal Aunt   ?     unk type  ? Cancer Paternal Uncle   ?     one unk type; one had throat  ? Cancer Cousin   ?     unk types  ? ? ?SOCIAL HISTORY:   ?Social History  ? ?Tobacco Use  ? Smoking status: Every Day  ?  Packs/day: 0.50  ?  Years: 60.00  ?  Pack years: 30.00  ?  Types: Cigarettes  ? Smokeless tobacco: Never  ? Tobacco comments:  ?  smoking cessation information provided  ?Vaping Use  ? Vaping Use: Never used  ?Substance Use Topics  ? Alcohol use: Not Currently  ? Drug use: Not Currently  ? ? ?ALLERGIES:  has No Known Allergies. ? ?MEDICATIONS:  ?Current Outpatient Medications  ?Medication Sig Dispense Refill  ? acetaminophen (TYLENOL) 500 MG  tablet Take 1 tablet (500 mg total) by mouth every 6 (six) hours as needed. 30 tablet 0  ? Ca Carbonate-Mag Hydroxide (ROLAIDS PO) Take 1 tablet by mouth as needed.    ? darolutamide (NUBEQA) 300 MG tablet Take 600 mg by mouth 2 (two) times daily with a meal.    ? dexamethasone (DECADRON) 4 MG tablet Take one pill AM & PM x 2 days-TAKE the day prior to chemo; and the day after chemo; do not take on the day of chemo. 60 tablet 0  ? ezetimibe (ZETIA) 10 MG tablet Take 1 tablet (10 mg total) by mouth daily. (Patient taking differently: Take 10 mg by mouth every morning.) 90 tablet 1  ? Ipratropium-Albuterol (COMBIVENT) 20-100 MCG/ACT AERS respimat Inhale 1 puff into the lungs every 6 (six) hours. 4 g 1  ? lidocaine-prilocaine (EMLA) cream Apply on the port. 30 -45 min  prior to port access. 30 g 3  ? lisinopril (ZESTRIL) 5 MG tablet Take 1 tablet (5 mg total) by mouth daily. For bp and kidney protection 90 tablet 1  ? ondansetron (ZOFRAN) 8 MG tablet One pill every 8 hours as  needed for nausea/vomitting. 40 tablet 1  ? prochlorperazine (COMPAZINE) 10 MG tablet Take 1 tablet (10 mg total) by mouth every 6 (six) hours as needed for nausea or vomiting. 40 tablet 1  ? rosuvastatin (CRESTOR) 40 MG tablet Take 1 tablet (40 mg total) by mouth daily. 90 tablet 1  ? meclizine (ANTIVERT) 25 MG tablet TAKE 1 TABLET (25 MG TOTAL) BY MOUTH DAILY AS NEEDED FOR DIZZINESS. (Patient not taking: Reported on 02/23/2022) 30 tablet 0  ? metoprolol succinate (TOPROL-XL) 25 MG 24 hr tablet Take 1 tablet (25 mg total) by mouth daily. Take with or immediately following a meal. (Patient not taking: Reported on 02/23/2022) 90 tablet 1  ? tamsulosin (FLOMAX) 0.4 MG CAPS capsule Take 1 capsule (0.4 mg total) by mouth daily after supper. (Patient not taking: Reported on 02/23/2022) 30 capsule 3  ? ?No current facility-administered medications for this visit.  ? ?Facility-Administered Medications Ordered in Other Visits  ?Medication Dose Route Frequency  Provider Last Rate Last Admin  ? heparin lock flush 100 unit/mL  500 Units Intravenous Once Cammie Sickle, MD      ? ? ?PHYSICAL EXAMINATION: ?ECOG PERFORMANCE STATUS: 1 - Symptomatic but compl

## 2022-02-24 ENCOUNTER — Inpatient Hospital Stay: Payer: Medicare HMO

## 2022-02-24 DIAGNOSIS — C61 Malignant neoplasm of prostate: Secondary | ICD-10-CM | POA: Diagnosis not present

## 2022-02-24 DIAGNOSIS — Z5189 Encounter for other specified aftercare: Secondary | ICD-10-CM | POA: Diagnosis not present

## 2022-02-24 DIAGNOSIS — C7951 Secondary malignant neoplasm of bone: Secondary | ICD-10-CM | POA: Diagnosis not present

## 2022-02-24 DIAGNOSIS — Z5111 Encounter for antineoplastic chemotherapy: Secondary | ICD-10-CM | POA: Diagnosis not present

## 2022-02-24 MED ORDER — PEGFILGRASTIM-CBQV 6 MG/0.6ML ~~LOC~~ SOSY
6.0000 mg | PREFILLED_SYRINGE | Freq: Once | SUBCUTANEOUS | Status: AC
  Administered 2022-02-24: 6 mg via SUBCUTANEOUS
  Filled 2022-02-24: qty 0.6

## 2022-02-28 ENCOUNTER — Telehealth: Payer: Self-pay

## 2022-02-28 NOTE — Telephone Encounter (Signed)
Copied from [facility]. Topic: Appointment Scheduling - Scheduling Inquiry for Clinic ?>> Feb 28, 2022  1:07 PM Yvette Rack wrote: ?Reason for CRM: Pt daughter called to make sure pt would be done in time for his appt at the [facility] at 10:45 on 03/04/22. Pt daughter stated she wanted the office to be aware pt can not be late for the appt at the [facility]. ?

## 2022-03-01 ENCOUNTER — Other Ambulatory Visit: Payer: Self-pay | Admitting: Family Medicine

## 2022-03-01 DIAGNOSIS — I2510 Atherosclerotic heart disease of native coronary artery without angina pectoris: Secondary | ICD-10-CM

## 2022-03-01 DIAGNOSIS — E7800 Pure hypercholesterolemia, unspecified: Secondary | ICD-10-CM

## 2022-03-01 MED ORDER — ATORVASTATIN CALCIUM 40 MG PO TABS: 40.0000 mg | ORAL_TABLET | Freq: Every day | ORAL | 1 refills | Status: DC

## 2022-03-02 ENCOUNTER — Telehealth: Payer: Self-pay

## 2022-03-02 NOTE — Telephone Encounter (Signed)
Pt.notified

## 2022-03-02 NOTE — Telephone Encounter (Signed)
-----   Message from Steele Sizer, MD sent at 03/01/2022  5:37 PM EDT ----- ?Regarding: FW: Oral chemo DDI adjustment ?Please let patient know that we need to stop Crestor and I send rx for Atorvastatin 40 mg  ?----- Message ----- ?From: Darl Pikes, RPH-CPP ?Sent: 03/01/2022   2:24 PM EDT ?To: Steele Sizer, MD ?Subject: FW: Oral chemo DDI adjustment                 ? ?Dr. Ancil Boozer, ? ?Good afternoon! Just reaching out to follow-up on the below message.  ? ?Best, ?-Alyson  ? ?----- Message ----- ?From: Darl Pikes, RPH-CPP ?Sent: 02/02/2022   9:52 AM EDT ?To: Steele Sizer, MD ?Subject: Oral chemo DDI adjustment                     ? ?Dr. Ancil Boozer, ? ?Good morning! We are starting [name] on an oral chemotherapy, Nubeqa (darolutamide) today for his prostate cancer which interacts with the rosuvastatin you are prescribing. Darolutamide may increase the serum concentration of rosuvastatin. The recommendation would be to limit the dose of rosuvastatin to 5 mg daily when combined with darolutamide. Another alternative would be changing him to atorvastatin which has the same interaction but does not require a dose adjustment, just monitoring for increased side effects. For now, we have instructed [name] to hold his atorvastatin '40mg'$ .  ? ?Would you be okay with making one of the above changes to his statin?  ? ?-Alyson  ? ?Darl Pikes, PharmD, BCPS, BCOP, CPP ?Hematology/Oncology Clinical Pharmacist Practitioner ?[facility] ?[phone] ? ?02/02/2022 9:42 AM ? ? ? ? ?

## 2022-03-03 NOTE — Progress Notes (Signed)
Name: Eric [name]   MRN: [id number]    DOB: 04-09-1941   Date:03/04/2022 ? ?     Progress Note ? ?Subjective ? ?Chief Complaint ? ?Follow Up ? ?HPI ? ?Emphysema:he has been smoking for the past 60 plus years, still smoked half pack daily but is trying to cut down . He has a smokers cough, usually dry, occasional wheezing and also sob with activity. We gave him Combivent but only prn but does not using it due to cost  . Reviewed CT chest , he is now getting scans through cancer center due to Prostate cancer with mets  ?  ?CKI stage III: discussed avoiding nsaid's, we will monitor for now. No symptoms. GFR is stable between mid 40's to 50's, last one slightly better but oscillates.Marland Kitchen He denies pruritis, normal urine output  ? ?CAD 3 vessels and aorta atherosclerosis. On CT chest done 06/2018 and repeat in 07/02/2019  He denies chest pain or decrease in exercise tolerance. He is on Crestor and Zetia last LDL was down from 172 down to 94 . He just started prostate cancer treatment and due to drug to drug interactions he is now off Rosuvastatin and is on Atorvastatin, still taking Zetia  ? ?Hyperglycemia, hgbA1C went up from 5.0% to 6.1% 6.3 % and last visit was 6.6 % , however , we will recheck level prior to changing diagnosis to DM. He will return in 2 months. He denies polyphagia, polyuria or polydipsia. ?  ?HTN: he does not seem to be taking medications daily, should have been out, but states he has it at home, lisinopril and metoprolol, no side effects. No chest pain or palpitation  ? ?Prostate Cancer with mets to bones and lymphonodi : under the care of Dr. Rogue Bussing.  ? ?Dysthymia: struggling financially, newly diagnosed with prostate cancer with mets. We will place referral to community care coordination  ? ?Patient Active Problem List  ? Diagnosis Date Noted  ? Metastatic cancer to bone ([place]) 03/04/2022  ? Lower urinary tract symptoms (LUTS) 03/04/2022  ? Pure hypercholesterolemia 03/04/2022  ? Dysthymia  03/04/2022  ? Family history of cancer 02/02/2022  ? Prostate cancer ([place]) 12/13/2021  ? Coronary artery calcification seen on CAT scan 06/28/2018  ? Centrilobular emphysema ([place]) 06/28/2018  ? Chronic kidney disease, stage III (moderate) ([place]) 06/28/2018  ? Atherosclerosis of aorta ([place]) 06/19/2018  ? Chronic low back pain with sciatica 02/07/2018  ? Hyperglycemia 05/03/2017  ? Simple chronic bronchitis ([place]) 05/07/2015  ? Essential hypertension 05/07/2015  ? Tobacco abuse 05/07/2015  ? ? ?Past Surgical History:  ?Procedure Laterality Date  ? COLONOSCOPY    ? HEMORROIDECTOMY    ? IR IMAGING GUIDED PORT INSERTION  12/20/2021  ? PROSTATE BIOPSY N/A 10/26/2021  ? Procedure: PROSTATE BIOPSY;  Surgeon: Abbie Sons, MD;  Location: [facility] ORS;  Service: Urology;  Laterality: N/A;  ? TRANSRECTAL ULTRASOUND N/A 10/26/2021  ? Procedure: TRANSRECTAL ULTRASOUND;  Surgeon: Abbie Sons, MD;  Location: [facility] ORS;  Service: Urology;  Laterality: N/A;  ? ? ?Family History  ?Problem Relation Age of Onset  ? Heart disease Mother   ? COPD Father   ? Cancer Brother 19  ?     blood cancer; unk exact type  ? Cancer Maternal Aunt   ?     unk type  ? Cancer Paternal Aunt   ?     unk type  ? Cancer Paternal Uncle   ?     one unk  type; one had throat  ? Cancer Cousin   ?     unk types  ? ? ?Social History  ? ?Tobacco Use  ? Smoking status: Every Day  ?  Packs/day: 0.50  ?  Years: 60.00  ?  Pack years: 30.00  ?  Types: Cigarettes  ? Smokeless tobacco: Never  ? Tobacco comments:  ?  smoking cessation information provided  ?Substance Use Topics  ? Alcohol use: Not Currently  ? ? ? ?Current Outpatient Medications:  ?  acetaminophen (TYLENOL) 500 MG tablet, Take 1 tablet (500 mg total) by mouth every 6 (six) hours as needed., Disp: 30 tablet, Rfl: 0 ?  albuterol (VENTOLIN HFA) 108 (90 Base) MCG/ACT inhaler, Inhale 2 puffs into the lungs every 6 (six) hours as needed for wheezing or shortness of breath., Disp: 18 g, Rfl: 0 ?  atorvastatin  (LIPITOR) 40 MG tablet, Take 1 tablet (40 mg total) by mouth daily. In place of rosuvastatin due to caner treatment, Disp: 90 tablet, Rfl: 1 ?  Ca Carbonate-Mag Hydroxide (ROLAIDS PO), Take 1 tablet by mouth as needed., Disp: , Rfl:  ?  darolutamide (NUBEQA) 300 MG tablet, Take 600 mg by mouth 2 (two) times daily with a meal., Disp: , Rfl:  ?  dexamethasone (DECADRON) 4 MG tablet, Take one pill AM & PM x 2 days-TAKE the day prior to chemo; and the day after chemo; do not take on the day of chemo., Disp: 60 tablet, Rfl: 0 ?  ezetimibe (ZETIA) 10 MG tablet, Take 1 tablet (10 mg total) by mouth every morning., Disp: 90 tablet, Rfl: 0 ?  Fluticasone-Umeclidin-Vilant (TRELEGY ELLIPTA) 100-62.5-25 MCG/ACT AEPB, Inhale 1 puff into the lungs daily., Disp: 3 each, Rfl: 1 ?  lidocaine-prilocaine (EMLA) cream, Apply on the port. 30 -45 min  prior to port access., Disp: 30 g, Rfl: 3 ?  meclizine (ANTIVERT) 25 MG tablet, TAKE 1 TABLET (25 MG TOTAL) BY MOUTH DAILY AS NEEDED FOR DIZZINESS., Disp: 30 tablet, Rfl: 0 ?  ondansetron (ZOFRAN) 8 MG tablet, One pill every 8 hours as needed for nausea/vomitting., Disp: 40 tablet, Rfl: 1 ?  prochlorperazine (COMPAZINE) 10 MG tablet, Take 1 tablet (10 mg total) by mouth every 6 (six) hours as needed for nausea or vomiting., Disp: 40 tablet, Rfl: 1 ?  tamsulosin (FLOMAX) 0.4 MG CAPS capsule, Take 1 capsule (0.4 mg total) by mouth daily after supper., Disp: 30 capsule, Rfl: 3 ?  lisinopril (ZESTRIL) 5 MG tablet, Take 1 tablet (5 mg total) by mouth daily. For bp and kidney protection, Disp: 90 tablet, Rfl: 1 ?  metoprolol succinate (TOPROL-XL) 25 MG 24 hr tablet, Take 1 tablet (25 mg total) by mouth daily. Take with or immediately following a meal., Disp: 90 tablet, Rfl: 1 ? ?No Known Allergies ? ?I personally reviewed active problem list, medication list, allergies, family history, social history, health maintenance with the patient/caregiver today. ? ? ?ROS ? ?Constitutional: Negative for  fever or weight change.  ?Respiratory: positive  for cough and shortness of breath.   ?Cardiovascular: Negative for chest pain or palpitations.  ?Gastrointestinal: Negative for abdominal pain, no bowel changes.  ?Musculoskeletal: positive for gait problem ( using a cane)  but no  joint swelling.  ?Skin: Negative for rash.  ?Neurological: Negative for dizziness or headache.  ?No other specific complaints in a complete review of systems (except as listed in HPI above).  ? ?Objective ? ?Vitals:  ? 03/04/22 0939  ?BP: 136/68  ?Pulse:  93  ?Resp: 16  ?SpO2: 98%  ?Weight: 147 lb (66.7 kg)  ?Height: '5\' 6"'$  (1.676 m)  ? ? ?Body mass index is 23.73 kg/m?. ? ?Physical Exam ? ?Constitutional: Patient appears well-developed and  thin   No distress.  ?HEENT: head atraumatic, normocephalic, pupils equal and reactive to light,, neck supple ?Cardiovascular: Normal rate, regular rhythm and normal heart sounds.  No murmur heard. No BLE edema. ?Pulmonary/Chest: Effort normal and breath sounds normal. No respiratory distress. ?Abdominal: Soft.  There is no tenderness. ?Psychiatric: Patient has a normal mood and affect. behavior is normal. Judgment and thought content normal.  ? ?PHQ2/9: ? ?  03/04/2022  ?  9:47 AM 02/10/2022  ?  8:24 AM 01/14/2022  ?  1:13 PM 09/30/2021  ? 10:31 AM 09/03/2021  ? 10:29 AM  ?Depression screen PHQ 2/9  ?Decreased Interest 3 0 0 0 0  ?Down, Depressed, Hopeless 1 1 0 0 0  ?PHQ - 2 Score 4 1 0 0 0  ?Altered sleeping 0   0 0  ?Tired, decreased energy 3   0 0  ?Change in appetite 0   0 0  ?Feeling bad or failure about yourself  0   0 0  ?Trouble concentrating 0   0 0  ?Moving slowly or fidgety/restless 0   0 0  ?Suicidal thoughts 0   0 0  ?PHQ-9 Score 7   0 0  ?  ?phq 9 is positive ? ? ?Fall Risk: ? ?  03/04/2022  ?  9:38 AM 02/10/2022  ?  8:26 AM 09/30/2021  ? 10:31 AM 09/03/2021  ? 10:28 AM 03/03/2021  ?  9:53 AM  ?Fall Risk   ?Falls in the past year? 0 0 0 0 0  ?Number falls in past yr: 0 0 0 0 0  ?Injury with Fall?  0 0 0 0 0  ?Risk for fall due to : No Fall Risks Impaired balance/gait No Fall Risks No Fall Risks   ?Follow up Falls prevention discussed Falls prevention discussed Falls prevention discussed Falls prevention d

## 2022-03-04 ENCOUNTER — Inpatient Hospital Stay: Payer: Medicare HMO

## 2022-03-04 ENCOUNTER — Encounter: Payer: Self-pay | Admitting: Family Medicine

## 2022-03-04 ENCOUNTER — Ambulatory Visit (INDEPENDENT_AMBULATORY_CARE_PROVIDER_SITE_OTHER): Payer: Medicare HMO | Admitting: Family Medicine

## 2022-03-04 VITALS — BP 136/68 | HR 93 | Resp 16 | Ht 66.0 in | Wt 147.0 lb

## 2022-03-04 VITALS — BP 140/66 | HR 90 | Temp 97.8°F | Resp 18

## 2022-03-04 DIAGNOSIS — C61 Malignant neoplasm of prostate: Secondary | ICD-10-CM

## 2022-03-04 DIAGNOSIS — I700 Atherosclerosis of aorta: Secondary | ICD-10-CM

## 2022-03-04 DIAGNOSIS — F341 Dysthymic disorder: Secondary | ICD-10-CM

## 2022-03-04 DIAGNOSIS — R399 Unspecified symptoms and signs involving the genitourinary system: Secondary | ICD-10-CM | POA: Insufficient documentation

## 2022-03-04 DIAGNOSIS — N1831 Chronic kidney disease, stage 3a: Secondary | ICD-10-CM

## 2022-03-04 DIAGNOSIS — Z5189 Encounter for other specified aftercare: Secondary | ICD-10-CM | POA: Diagnosis not present

## 2022-03-04 DIAGNOSIS — I10 Essential (primary) hypertension: Secondary | ICD-10-CM

## 2022-03-04 DIAGNOSIS — C7951 Secondary malignant neoplasm of bone: Secondary | ICD-10-CM | POA: Diagnosis not present

## 2022-03-04 DIAGNOSIS — J432 Centrilobular emphysema: Secondary | ICD-10-CM | POA: Diagnosis not present

## 2022-03-04 DIAGNOSIS — Z5986 Financial insecurity: Secondary | ICD-10-CM

## 2022-03-04 DIAGNOSIS — I2510 Atherosclerotic heart disease of native coronary artery without angina pectoris: Secondary | ICD-10-CM

## 2022-03-04 DIAGNOSIS — E7800 Pure hypercholesterolemia, unspecified: Secondary | ICD-10-CM | POA: Diagnosis not present

## 2022-03-04 DIAGNOSIS — Z5111 Encounter for antineoplastic chemotherapy: Secondary | ICD-10-CM | POA: Diagnosis not present

## 2022-03-04 DIAGNOSIS — R739 Hyperglycemia, unspecified: Secondary | ICD-10-CM

## 2022-03-04 LAB — CBC WITH DIFFERENTIAL/PLATELET
Abs Immature Granulocytes: 3.62 10*3/uL — ABNORMAL HIGH (ref 0.00–0.07)
Basophils Absolute: 0 10*3/uL (ref 0.0–0.1)
Basophils Relative: 0 %
Eosinophils Absolute: 0 10*3/uL (ref 0.0–0.5)
Eosinophils Relative: 0 %
HCT: 28.9 % — ABNORMAL LOW (ref 39.0–52.0)
Hemoglobin: 9.5 g/dL — ABNORMAL LOW (ref 13.0–17.0)
Immature Granulocytes: 9 %
Lymphocytes Relative: 8 %
Lymphs Abs: 2.9 10*3/uL (ref 0.7–4.0)
MCH: 33 pg (ref 26.0–34.0)
MCHC: 32.9 g/dL (ref 30.0–36.0)
MCV: 100.3 fL — ABNORMAL HIGH (ref 80.0–100.0)
Monocytes Absolute: 1.5 10*3/uL — ABNORMAL HIGH (ref 0.1–1.0)
Monocytes Relative: 4 %
Neutro Abs: 30.7 10*3/uL — ABNORMAL HIGH (ref 1.7–7.7)
Neutrophils Relative %: 79 %
Platelets: 284 10*3/uL (ref 150–400)
RBC: 2.88 MIL/uL — ABNORMAL LOW (ref 4.22–5.81)
RDW: 17.2 % — ABNORMAL HIGH (ref 11.5–15.5)
Smear Review: NORMAL
WBC: 38.8 10*3/uL — ABNORMAL HIGH (ref 4.0–10.5)
nRBC: 0.7 % — ABNORMAL HIGH (ref 0.0–0.2)

## 2022-03-04 LAB — BASIC METABOLIC PANEL
Anion gap: 8 (ref 5–15)
BUN: 12 mg/dL (ref 8–23)
CO2: 22 mmol/L (ref 22–32)
Calcium: 8.4 mg/dL — ABNORMAL LOW (ref 8.9–10.3)
Chloride: 106 mmol/L (ref 98–111)
Creatinine, Ser: 1.17 mg/dL (ref 0.61–1.24)
GFR, Estimated: >60 mL/min (ref >60)
Glucose, Bld: 132 mg/dL — ABNORMAL HIGH (ref 70–99)
Potassium: 3.8 mmol/L (ref 3.5–5.1)
Sodium: 136 mmol/L (ref 135–145)

## 2022-03-04 MED ORDER — SODIUM CHLORIDE 0.9 % IV SOLN
Freq: Once | INTRAVENOUS | Status: AC
  Filled 2022-03-04: qty 250

## 2022-03-04 MED ORDER — LISINOPRIL 5 MG PO TABS: 5.0000 mg | ORAL_TABLET | Freq: Every day | ORAL | 1 refills | Status: DC

## 2022-03-04 MED ORDER — SODIUM CHLORIDE 0.9 % IV SOLN
10.0000 mg | Freq: Once | INTRAVENOUS | Status: AC
  Administered 2022-03-04: 10 mg via INTRAVENOUS
  Filled 2022-03-04: qty 10

## 2022-03-04 MED ORDER — TRELEGY ELLIPTA 100-62.5-25 MCG/ACT IN AEPB: 1.0000 | INHALATION_SPRAY | Freq: Every day | RESPIRATORY_TRACT | 1 refills | Status: DC

## 2022-03-04 MED ORDER — HEPARIN SOD (PORK) LOCK FLUSH 100 UNIT/ML IV SOLN
500.0000 [IU] | Freq: Once | INTRAVENOUS | Status: AC | PRN
  Administered 2022-03-04: 500 [IU]
  Filled 2022-03-04: qty 5

## 2022-03-04 MED ORDER — ALBUTEROL SULFATE HFA 108 (90 BASE) MCG/ACT IN AERS: 2.0000 | INHALATION_SPRAY | Freq: Four times a day (QID) | RESPIRATORY_TRACT | 0 refills | Status: DC | PRN

## 2022-03-04 MED ORDER — SODIUM CHLORIDE 0.9% FLUSH
10.0000 mL | Freq: Once | INTRAVENOUS | Status: AC | PRN
  Administered 2022-03-04: 10 mL
  Filled 2022-03-04: qty 10

## 2022-03-04 MED ORDER — METOPROLOL SUCCINATE ER 25 MG PO TB24: 25.0000 mg | ORAL_TABLET | Freq: Every day | ORAL | 1 refills | Status: DC

## 2022-03-04 NOTE — Assessment & Plan Note (Signed)
He states combivent very costly ?We will try switching to another inhaler , he does not want neb machine  ?

## 2022-03-04 NOTE — Assessment & Plan Note (Signed)
He is on Flomax and states symptoms are better  ?

## 2022-03-04 NOTE — Assessment & Plan Note (Signed)
GFR goes up and down but multiple times below 60 ?On ACE ?

## 2022-03-04 NOTE — Assessment & Plan Note (Signed)
At goal today

## 2022-03-04 NOTE — Assessment & Plan Note (Signed)
Switched from Rosuvastatin to Atorvastatin due to prostate cancer treatment - drug interaction - he brought a paper stating he is taking zetia but not on our active list  ?

## 2022-03-04 NOTE — Assessment & Plan Note (Signed)
He is off Rosuvastatin and on Atorvastatin due to drug to drug interaction - cancer therapy  ?

## 2022-03-04 NOTE — Assessment & Plan Note (Signed)
We will recheck A1C on his next visit  ?

## 2022-03-04 NOTE — Assessment & Plan Note (Signed)
He denies pain  ?

## 2022-03-07 ENCOUNTER — Telehealth: Payer: Self-pay | Admitting: *Deleted

## 2022-03-07 NOTE — Chronic Care Management (AMB) (Signed)
  Chronic Care Management   Outreach Note  03/07/2022 Name: [name] MRN: [id number] DOB: December 22, 1940  Eric [name] is a 81 y.o. year old male who is a primary care patient of Steele Sizer, MD. I reached out to [name] by phone today in response to a referral sent by Mr. Kayen Grabel Lieder's primary care provider.  An unsuccessful telephone outreach was attempted today. The patient was referred to the case management team for assistance with care management and care coordination.   Follow Up Plan: A HIPAA compliant phone message was left for the patient providing contact information and requesting a return call.   Julian Hy, [place] Management  Direct Dial: [phone]

## 2022-03-08 ENCOUNTER — Ambulatory Visit: Payer: Self-pay | Admitting: Licensed Clinical Social Worker

## 2022-03-08 ENCOUNTER — Telehealth: Payer: Self-pay | Admitting: Licensed Clinical Social Worker

## 2022-03-08 ENCOUNTER — Encounter: Payer: Self-pay | Admitting: Licensed Clinical Social Worker

## 2022-03-08 DIAGNOSIS — Z1379 Encounter for other screening for genetic and chromosomal anomalies: Secondary | ICD-10-CM

## 2022-03-08 NOTE — Telephone Encounter (Signed)
Revealed negative genetic testing.  Revealed that a VUS in BRIP1 was identified. This normal result is reassuring and indicates that it is unlikely Mr. Dupler's cancer is due to a hereditary cause.  It is unlikely that there is an increased risk of another cancer due to a mutation in one of these genes.  However, genetic testing is not perfect, and cannot definitively rule out a hereditary cause.  It will be important for him to keep in contact with genetics to learn if any additional testing may be needed in the future.    ?

## 2022-03-08 NOTE — Progress Notes (Signed)
HPI:  [name] was previously seen in the [facility] due to a personal and family history of cancer and concerns regarding a hereditary predisposition to cancer. Please refer to our prior cancer genetics clinic note for more information regarding our discussion, assessment and recommendations, at the time. Eric [name] recent genetic test results were disclosed to him, as were recommendations warranted by these results. These results and recommendations are discussed in more detail below. ? ?CANCER HISTORY:  ?Oncology History Overview Note  ?compatible with widespread metastatic prostate ?cancer, including innumerable sclerotic osseous lesions and ?extensive lymphadenopathy in the pelvis and retroperitoneum, as ?detailed above. This includes lymphadenopathy in the upper right ?hemipelvis which likely causes severe stenosis or complete occlusion ?of the right common iliac vein. ?2. There is also an indeterminate hypovascular lesion in segment 5 ?of the liver which may represent a metastatic lesion. This could be ?better characterized with follow-up abdominal MRI with and without ?IV gadolinium if clinically appropriate. ?3. Aortic atherosclerosis with mild fusiform aneurysmal dilatation ?of the infrarenal abdominal aorta measuring up to 3.2 x 3.0 cm. ?Recommend follow-up ultrasound every 3 years. This recommendation ?follows ACR consensus guidelines: White Paper of the ACR Incidental ?Findings Committee II on Vascular Findings. J Am Coll Radiol 2013; ?16:109-604. ?4. Bladder wall appears mildly thickened, likely related to bladder ?outlet obstruction given the enlarged prostate gland. ?5. Severe colonic diverticulosis without evidence of acute ?diverticulitis at this time. ? ?IMPRESSION: ?There are numerous foci of abnormal tracer uptake in the axial and ?proximal appendicular skeleton consistent with extensive skeletal ?metastatic disease. ?  ? ? ?#Stage IV castrate sensitive prostate  cancer-December/20 2023-MRI negative for any liver lesions.; FEB 24th, 2022-Firmagon loading dose; ? ?# march 24th-Taxoeter with udenyca; ?4/12- add darolutamide ? ?# DEC 2022- 540 [[facility]; Urology] ?  ?Prostate cancer ([place])  ?12/13/2021 Initial Diagnosis  ? Prostate cancer [facility]) ?  ?12/13/2021 Cancer Staging  ? Staging form: Prostate, AJCC 8th Edition ?- Clinical: Stage IVB (cT2c, cN1, cM1, PSA: 374, Grade Group: 5) - Signed by Cammie Sickle, MD on 12/13/2021 ?Prostate specific antigen (PSA) range: 20 or greater ?Histologic grading system: 5 grade system ? ?  ?01/12/2022 -  Chemotherapy  ? Patient is on Treatment Plan : PROSTATE Docetaxel + Prednisone q21d  ? ?   ? Genetic Testing  ? Negative genetic testing. No pathogenic variants identified on the [facility] Common Hereditary Cancers +RNA panel. VUS in BRIP1 called c.485G>T identified. The report date is 03/04/2022. ? ?The Common Hereditary Cancers Panel + RNA offered by [facility] includes sequencing and/or deletion duplication testing of the following 47 genes: APC, ATM, AXIN2, BARD1, BMPR1A, BRCA1, BRCA2, BRIP1, CDH1, CDKN2A (p14ARF), CDKN2A (p16INK4a), CKD4, CHEK2, CTNNA1, DICER1, EPCAM (Deletion/duplication testing only), GREM1 (promoter region deletion/duplication testing only), KIT, MEN1, MLH1, MSH2, MSH3, MSH6, MUTYH, NBN, NF1, NHTL1, PALB2, PDGFRA, PMS2, POLD1, POLE, PTEN, RAD50, RAD51C, RAD51D, SDHB, SDHC, SDHD, SMAD4, SMARCA4. STK11, TP53, TSC1, TSC2, and VHL.  The following genes were evaluated for sequence changes only: SDHA and HOXB13 c.251G>A variant only. ?  ? ? ?FAMILY HISTORY:  ?We obtained a detailed, 4-generation family history.  Significant diagnoses are listed below: ?Family History  ?Problem Relation Age of Onset  ? Heart disease Mother   ? COPD Father   ? Cancer Brother 27  ?     blood cancer; unk exact type  ? Cancer Maternal Aunt   ?     unk type  ? Cancer Paternal Aunt   ?  unk type  ? Cancer Paternal Uncle   ?     one unk type; one  had throat  ? Cancer Cousin   ?     unk types  ? ? ?Eric [name] has 1 daughter, Eric [name], age 54. She had cervical cancer at 54. Eric [name] also has 2 brothers. One was diagnosed with a blood cancer at 26 and is still getting treatments for it.  ?  ?Eric [name]'s mother died at 34. Patient had many aunts and uncles, one aunt had cancer, unknown type and two maternal cousins had cancers, unknown types. Maternal grandfather died at 73, unk age of death for grandmother. ?  ?Eric [name]'s father died at 68 of emphysema. Patient had 11 maternal aunts/uncles. Two uncles had cancer, one was an unknown type and the other was throat cancer. An aunt had an unknown type of cancer as well. Paternal grandmother died over age 43, grandfather died at a younger age.  ?  ?Eric [name] is unaware of previous family history of genetic testing for hereditary cancer risks. There is no reported Ashkenazi Jewish ancestry. There is no known consanguinity. ?  ? ? ?GENETIC TEST RESULTS: Genetic testing reported out on 03/04/2022 through the [facility] Common Hereditary Cancers+RNA cancer panel found no pathogenic mutations.  ? ?The Common Hereditary Cancers Panel + RNA offered by [facility] includes sequencing and/or deletion duplication testing of the following 47 genes: APC, ATM, AXIN2, BARD1, BMPR1A, BRCA1, BRCA2, BRIP1, CDH1, CDKN2A (p14ARF), CDKN2A (p16INK4a), CKD4, CHEK2, CTNNA1, DICER1, EPCAM (Deletion/duplication testing only), GREM1 (promoter region deletion/duplication testing only), KIT, MEN1, MLH1, MSH2, MSH3, MSH6, MUTYH, NBN, NF1, NHTL1, PALB2, PDGFRA, PMS2, POLD1, POLE, PTEN, RAD50, RAD51C, RAD51D, SDHB, SDHC, SDHD, SMAD4, SMARCA4. STK11, TP53, TSC1, TSC2, and VHL.  The following genes were evaluated for sequence changes only: SDHA and HOXB13 c.251G>A variant only.  ? ?The test report has been scanned into EPIC and is located under the Molecular Pathology section of the Results Review tab.  A portion of the result report is  included below for reference.  ? ? ? ?We discussed that because current genetic testing is not perfect, it is possible there may be a gene mutation in one of these genes that current testing cannot detect, but that chance is small.  There could be another gene that has not yet been discovered, or that we have not yet tested, that is responsible for the cancer diagnoses in the family. It is also possible there is a hereditary cause for the cancer in the family that Mr. Curl did not inherit and therefore was not identified in his testing.  Therefore, it is important to remain in touch with cancer genetics in the future so that we can continue to offer Mr. Byrom the most up to date genetic testing.  ? ?Genetic testing did identify a variant of uncertain significance (VUS) in the BRIP1 gene called c.485G>T.  At this time, it is unknown if this variant is associated with increased cancer risk or if this is a normal finding, but most variants such as this get reclassified to being inconsequential. It should not be used to make medical management decisions. With time, we suspect the lab will determine the significance of this variant, if any. If we do learn more about it we will try to contact Mr. Coin to discuss it further. However, it is important to stay in touch with [place] periodically and keep the address and phone number up to date. ? ?ADDITIONAL GENETIC TESTING: We  discussed with Mr. Haig that his genetic testing was fairly extensive.  If there are genes identified to increase cancer risk that can be analyzed in the future, we would be happy to discuss and coordinate this testing at that time.   ? ?CANCER SCREENING RECOMMENDATIONS: Mr. Bertucci's test result is considered negative (normal).  This means that we have not identified a hereditary cause for his  personal and family history of cancer at this time. Most cancers happen by chance and this negative test suggests that his cancer may fall into this  category.   ? ?While reassuring, this does not definitively rule out a hereditary predisposition to cancer. It is still possible that there could be genetic mutations that are undetectable by current technolo

## 2022-03-11 NOTE — Chronic Care Management (AMB) (Signed)
  Chronic Care Management   Outreach Note  03/11/2022 Name: Eric [name] MRN: [id number] DOB: 07/04/1941  Eric [name] is a 81 y.o. year old male who is a primary care patient of Steele Sizer, MD. I reached out to [name] by phone today in response to a referral sent by [name]'s primary care provider.  A second unsuccessful telephone outreach was attempted today. The patient was referred to the case management team for assistance with care management and care coordination.   Follow Up Plan: A HIPAA compliant phone message was left for the patient providing contact information and requesting a return call.   Julian Hy, [facility]  Direct Dial: [phone]

## 2022-03-16 ENCOUNTER — Inpatient Hospital Stay: Payer: Medicare HMO

## 2022-03-16 ENCOUNTER — Encounter: Payer: Self-pay | Admitting: Internal Medicine

## 2022-03-16 ENCOUNTER — Inpatient Hospital Stay (HOSPITAL_BASED_OUTPATIENT_CLINIC_OR_DEPARTMENT_OTHER): Payer: Medicare HMO | Admitting: Internal Medicine

## 2022-03-16 VITALS — BP 158/70 | HR 77 | Resp 20

## 2022-03-16 DIAGNOSIS — C61 Malignant neoplasm of prostate: Secondary | ICD-10-CM | POA: Diagnosis not present

## 2022-03-16 DIAGNOSIS — C7951 Secondary malignant neoplasm of bone: Secondary | ICD-10-CM | POA: Diagnosis not present

## 2022-03-16 DIAGNOSIS — Z5111 Encounter for antineoplastic chemotherapy: Secondary | ICD-10-CM | POA: Diagnosis not present

## 2022-03-16 DIAGNOSIS — Z5189 Encounter for other specified aftercare: Secondary | ICD-10-CM | POA: Diagnosis not present

## 2022-03-16 LAB — CBC WITH DIFFERENTIAL/PLATELET
Abs Immature Granulocytes: 0.05 10*3/uL (ref 0.00–0.07)
Basophils Absolute: 0 10*3/uL (ref 0.0–0.1)
Basophils Relative: 0 %
Eosinophils Absolute: 0 10*3/uL (ref 0.0–0.5)
Eosinophils Relative: 0 %
HCT: 31.3 % — ABNORMAL LOW (ref 39.0–52.0)
Hemoglobin: 10.2 g/dL — ABNORMAL LOW (ref 13.0–17.0)
Immature Granulocytes: 1 %
Lymphocytes Relative: 13 %
Lymphs Abs: 1.3 10*3/uL (ref 0.7–4.0)
MCH: 32.9 pg (ref 26.0–34.0)
MCHC: 32.6 g/dL (ref 30.0–36.0)
MCV: 101 fL — ABNORMAL HIGH (ref 80.0–100.0)
Monocytes Absolute: 0.4 10*3/uL (ref 0.1–1.0)
Monocytes Relative: 4 %
Neutro Abs: 8 10*3/uL — ABNORMAL HIGH (ref 1.7–7.7)
Neutrophils Relative %: 82 %
Platelets: 385 10*3/uL (ref 150–400)
RBC: 3.1 MIL/uL — ABNORMAL LOW (ref 4.22–5.81)
RDW: 17.7 % — ABNORMAL HIGH (ref 11.5–15.5)
WBC: 9.7 10*3/uL (ref 4.0–10.5)
nRBC: 0 % (ref 0.0–0.2)

## 2022-03-16 LAB — COMPREHENSIVE METABOLIC PANEL
ALT: 11 U/L (ref 0–44)
AST: 15 U/L (ref 15–41)
Albumin: 3.4 g/dL — ABNORMAL LOW (ref 3.5–5.0)
Alkaline Phosphatase: 150 U/L — ABNORMAL HIGH (ref 38–126)
Anion gap: 7 (ref 5–15)
BUN: 20 mg/dL (ref 8–23)
CO2: 21 mmol/L — ABNORMAL LOW (ref 22–32)
Calcium: 9.1 mg/dL (ref 8.9–10.3)
Chloride: 107 mmol/L (ref 98–111)
Creatinine, Ser: 1.09 mg/dL (ref 0.61–1.24)
GFR, Estimated: >60 mL/min (ref >60)
Glucose, Bld: 121 mg/dL — ABNORMAL HIGH (ref 70–99)
Potassium: 4.5 mmol/L (ref 3.5–5.1)
Sodium: 135 mmol/L (ref 135–145)
Total Bilirubin: 0.5 mg/dL (ref 0.3–1.2)
Total Protein: 6.7 g/dL (ref 6.5–8.1)

## 2022-03-16 LAB — PSA: Prostatic Specific Antigen: 31.05 ng/mL — ABNORMAL HIGH (ref 0.00–4.00)

## 2022-03-16 MED ORDER — SODIUM CHLORIDE 0.9 % IV SOLN
10.0000 mg | Freq: Once | INTRAVENOUS | Status: AC
  Administered 2022-03-16: 10 mg via INTRAVENOUS
  Filled 2022-03-16: qty 10

## 2022-03-16 MED ORDER — SODIUM CHLORIDE 0.9 % IV SOLN
75.0000 mg/m2 | Freq: Once | INTRAVENOUS | Status: AC
  Administered 2022-03-16: 130 mg via INTRAVENOUS
  Filled 2022-03-16: qty 13

## 2022-03-16 MED ORDER — ONDANSETRON HCL 8 MG PO TABS: ORAL_TABLET | ORAL | 1 refills | Status: DC

## 2022-03-16 MED ORDER — HEPARIN SOD (PORK) LOCK FLUSH 100 UNIT/ML IV SOLN
500.0000 [IU] | Freq: Once | INTRAVENOUS | Status: AC | PRN
  Administered 2022-03-16: 500 [IU]
  Filled 2022-03-16: qty 5

## 2022-03-16 MED ORDER — PROCHLORPERAZINE MALEATE 10 MG PO TABS: 10.0000 mg | ORAL_TABLET | Freq: Four times a day (QID) | ORAL | 1 refills | Status: DC | PRN

## 2022-03-16 MED ORDER — SODIUM CHLORIDE 0.9% FLUSH
10.0000 mL | Freq: Once | INTRAVENOUS | Status: AC
  Administered 2022-03-16: 10 mL via INTRAVENOUS
  Filled 2022-03-16: qty 10

## 2022-03-16 MED ORDER — SODIUM CHLORIDE 0.9% FLUSH
10.0000 mL | INTRAVENOUS | Status: DC | PRN
  Administered 2022-03-16: 10 mL
  Filled 2022-03-16: qty 10

## 2022-03-16 MED ORDER — SODIUM CHLORIDE 0.9 % IV SOLN
Freq: Once | INTRAVENOUS | Status: AC
  Filled 2022-03-16: qty 250

## 2022-03-16 MED ORDER — ONDANSETRON HCL 4 MG/2ML IJ SOLN
8.0000 mg | Freq: Once | INTRAMUSCULAR | Status: AC
  Administered 2022-03-16: 8 mg via INTRAVENOUS
  Filled 2022-03-16: qty 4

## 2022-03-16 MED ORDER — HEPARIN SOD (PORK) LOCK FLUSH 100 UNIT/ML IV SOLN
500.0000 [IU] | Freq: Once | INTRAVENOUS | Status: DC
  Filled 2022-03-16: qty 5

## 2022-03-16 MED FILL — Dexamethasone Sodium Phosphate Inj 100 MG/10ML: INTRAMUSCULAR | Qty: 1 | Status: AC

## 2022-03-16 NOTE — Progress Notes (Signed)
Nutrition Follow-up:   Patient with metastatic prostate cancer with bone mets.  Patient on firmagon and docetaxel and prednisone.    Met with patient during infusion.  Patient reports that his appetite is usually decreased for 3-4 days after treatment.  "Everything taste like medicine and I don't want to eat."  Appetite improves after those 3-4 days.  Drinks ensure at least 1 time per day.     Medications: reviewed  Labs: Vit D 48, normal  Anthropometrics:   Weight 151 lb, stable  151 lb on 4/12 149 lb on 3/22 151 lb on 1/18   NUTRITION DIAGNOSIS: Inadequate food intake stable   INTERVENTION:  Encouraged patient to maximize good days. Encouraged good sources of protein Continue shakes for added nutrition    MONITORING, EVALUATION, GOAL: weight trends, intake   NEXT VISIT: Wednesday, June 14 during infusion  Nicoli Nardozzi B. Zenia Resides, [place], [place] Registered Dietitian [phone]

## 2022-03-16 NOTE — Patient Instructions (Signed)
[facility]  Discharge Instructions: ?Thank you for choosing [facility] to provide your oncology and hematology care.  ?If you have a lab appointment with the Cancer Center, please go directly to the Cancer Center and check in at the registration area. ? ?Wear comfortable clothing and clothing appropriate for easy access to any Portacath or PICC line.  ? ?We strive to give you quality time with your provider. You may need to reschedule your appointment if you arrive late (15 or more minutes).  Arriving late affects you and other patients whose appointments are after yours.  Also, if you miss three or more appointments without notifying the office, you may be dismissed from the clinic at the provider?s discretion.    ?  ?For prescription refill requests, have your pharmacy contact our office and allow 72 hours for refills to be completed.   ? ?Today you received the following chemotherapy and/or immunotherapy agents: Taxotere   ?  ?To help prevent nausea and vomiting after your treatment, we encourage you to take your nausea medication as directed. ? ?BELOW ARE SYMPTOMS THAT SHOULD BE REPORTED IMMEDIATELY: ?*FEVER GREATER THAN 100.4 F (38 ?C) OR HIGHER ?*CHILLS OR SWEATING ?*NAUSEA AND VOMITING THAT IS NOT CONTROLLED WITH YOUR NAUSEA MEDICATION ?*UNUSUAL SHORTNESS OF BREATH ?*UNUSUAL BRUISING OR BLEEDING ?*URINARY PROBLEMS (pain or burning when urinating, or frequent urination) ?*BOWEL PROBLEMS (unusual diarrhea, constipation, pain near the anus) ?TENDERNESS IN MOUTH AND THROAT WITH OR WITHOUT PRESENCE OF ULCERS (sore throat, sores in mouth, or a toothache) ?UNUSUAL RASH, SWELLING OR PAIN  ?UNUSUAL VAGINAL DISCHARGE OR ITCHING  ? ?Items with * indicate a potential emergency and should be followed up as soon as possible or go to the Emergency Department if any problems should occur. ? ?Please show the CHEMOTHERAPY ALERT CARD or IMMUNOTHERAPY ALERT CARD at check-in to  the Emergency Department and triage nurse. ? ?Should you have questions after your visit or need to cancel or reschedule your appointment, please contact [facility]  [phone] and follow the prompts.  Office hours are 8:00 a.m. to 4:30 p.m. Monday - Friday. Please note that voicemails left after 4:00 p.m. may not be returned until the following business day.  We are closed weekends and major holidays. You have access to a nurse at all times for urgent questions. Please call the main number to the clinic [phone] and follow the prompts. ? ?For any non-urgent questions, you may also contact your provider using MyChart. We now offer e-Visits for anyone 18 and older to request care online for non-urgent symptoms. For details visit [email or web address]. ?  ?Also download the MyChart app! Go to the app store, search "MyChart", open the app, select [facility], and log in with your MyChart username and password. ? ?Due to Covid, a mask is required upon entering the hospital/clinic. If you do not have a mask, one will be given to you upon arrival. For doctor visits, patients may have 1 support person aged 18 or older with them. For treatment visits, patients cannot have anyone with them due to current Covid guidelines and our immunocompromised population.  ?

## 2022-03-16 NOTE — Progress Notes (Unsigned)
[address] [place] OFFICE PROGRESS NOTE  Patient Care Team: Steele Sizer, MD as PCP - General (Family Medicine) Cammie Sickle, MD as Consulting Physician (Oncology) Abbie Sons, MD (Urology)   Cancer Staging  Prostate cancer Eric [name]) Staging form: Prostate, AJCC 8th Edition - Clinical: Stage IVB (cT2c, cN1, cM1, PSA: 374, Grade Group: 5) - Signed by Cammie Sickle, MD on 12/13/2021 Prostate specific antigen (PSA) range: 20 or greater Histologic grading system: 5 grade system    Oncology History Overview Note  compatible with widespread metastatic prostate cancer, including innumerable sclerotic osseous lesions and extensive lymphadenopathy in the pelvis and retroperitoneum, as detailed above. 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 2. There is also an indeterminate hypovascular lesion in segment 5 of the liver which may represent a metastatic lesion. This could be better characterized with follow-up abdominal MRI with and without IV gadolinium if clinically appropriate. 3. Aortic atherosclerosis with mild fusiform aneurysmal dilatation of the infrarenal abdominal aorta measuring up to 3.2 x 3.0 cm. Recommend follow-up ultrasound every 3 years. This recommendation follows ACR consensus guidelines: White Paper of the ACR Incidental Findings Committee II on Vascular Findings. J Am Coll Radiol 2013; 10:789-794. 4. Bladder wall appears mildly thickened, likely related to bladder outlet obstruction given the enlarged prostate gland. 5. Severe colonic diverticulosis without evidence of acute diverticulitis at this time.  IMPRESSION: There are numerous foci of abnormal tracer uptake in the axial and proximal appendicular skeleton consistent with extensive skeletal metastatic disease.     #Stage IV castrate sensitive prostate cancer-December/20  2023-MRI negative for any liver lesions.; FEB 24th, 2022-Firmagon loading dose;  # march 24th-Taxoeter with udenyca; 4/12- add darolutamide  # DEC 2022- 37 [[place]; Urology]   Prostate cancer ([place])  12/13/2021 Initial Diagnosis   Prostate cancer ([place])   12/13/2021 Cancer Staging   Staging form: Prostate, AJCC 8th Edition - Clinical: Stage IVB (cT2c, cN1, cM1, PSA: 374, Grade Group: 5) - Signed by Cammie Sickle, MD on 12/13/2021 Prostate specific antigen (PSA) range: 20 or greater Histologic grading system: 5 grade system    01/12/2022 -  Chemotherapy   Patient is on Treatment Plan : PROSTATE Docetaxel + Prednisone q21d       Genetic Testing   Negative genetic testing. No pathogenic variants identified on the [facility] Common Hereditary Cancers +RNA panel. VUS in BRIP1 called c.485G>T identified. The report date is 03/04/2022.  The Common Hereditary Cancers Panel + RNA offered by [facility] includes sequencing and/or deletion duplication testing of the following 47 genes: APC, ATM, AXIN2, BARD1, BMPR1A, BRCA1, BRCA2, BRIP1, CDH1, CDKN2A (p14ARF), CDKN2A (p16INK4a), CKD4, CHEK2, CTNNA1, DICER1, EPCAM (Deletion/duplication testing only), GREM1 (promoter region deletion/duplication testing only), KIT, MEN1, MLH1, MSH2, MSH3, MSH6, MUTYH, NBN, NF1, NHTL1, PALB2, PDGFRA, PMS2, POLD1, POLE, PTEN, RAD50, RAD51C, RAD51D, SDHB, SDHC, SDHD, SMAD4, SMARCA4. STK11, TP53, TSC1, TSC2, and VHL.  The following genes were evaluated for sequence changes only: SDHA and HOXB13 c.251G>A variant only.      HISTORY OF PRESENT ILLNESS: Ambulating with a cane.  With daughter.    Eric [name] 81 y.o.  male pleasant patient above history of castrate sensitive metastatic prostate cancer currently on Taxotere chemotherapy + Darolutamide is here for follow-up.  Denies any swelling in the legs.  Denies any significant diarrhea.  Complains of mild tingling and numbness in the extremities.  No shortness of  breath or  cough.  Mild nausea but no vomiting.  Review of Systems  Constitutional:  Positive for malaise/fatigue and weight loss. Negative for chills, diaphoresis and fever.  HENT:  Negative for nosebleeds and sore throat.   Eyes:  Negative for double vision.  Respiratory:  Negative for cough, hemoptysis, sputum production, shortness of breath and wheezing.   Cardiovascular:  Negative for chest pain, palpitations, orthopnea and leg swelling.  Gastrointestinal:  Negative for abdominal pain, blood in stool, constipation, diarrhea, heartburn, melena, nausea and vomiting.  Genitourinary:  Negative for dysuria, frequency and urgency.  Musculoskeletal:  Positive for back pain and joint pain.  Skin: Negative.  Negative for itching and rash.  Neurological:  Negative for dizziness, tingling, focal weakness, weakness and headaches.  Endo/Heme/Allergies:  Does not bruise/bleed easily.  Psychiatric/Behavioral:  Negative for depression. The patient is not nervous/anxious and does not have insomnia.      PAST MEDICAL HISTORY :  Past Medical History:  Diagnosis Date   COPD (chronic obstructive pulmonary disease) ([place])    Dyspnea    with exertion   Family history of cancer    GERD (gastroesophageal reflux disease)    occ   Hyperlipidemia    Hypertension     PAST SURGICAL HISTORY :   Past Surgical History:  Procedure Laterality Date   COLONOSCOPY     HEMORROIDECTOMY     IR IMAGING GUIDED PORT INSERTION  12/20/2021   PROSTATE BIOPSY N/A 10/26/2021   Procedure: PROSTATE BIOPSY;  Surgeon: Abbie Sons, MD;  Location: [facility] ORS;  Service: Urology;  Laterality: N/A;   TRANSRECTAL ULTRASOUND N/A 10/26/2021   Procedure: TRANSRECTAL ULTRASOUND;  Surgeon: Abbie Sons, MD;  Location: [facility] OR[facility];  Service: Urology;  Laterality: N/A;    FAMILY HISTORY :   Family History  Problem Relation Age of Onset   Heart disease Mother    COPD Father    Cancer Brother 66       blood cancer; unk exact type    Cancer Maternal Aunt        unk type   Cancer Paternal Aunt        unk type   Cancer Paternal Uncle        one unk type; one had throat   Cancer Cousin        unk types    SOCIAL HISTORY:   Social History   Tobacco Use   Smoking status: Every Day    Packs/day: 0.50    Years: 60.00    Pack years: 30.00    Types: Cigarettes   Smokeless tobacco: Never   Tobacco comments:    smoking cessation information provided  Vaping Use   Vaping Use: Never used  Substance Use Topics   Alcohol use: Not Currently   Drug use: Not Currently    ALLERGIES:  has No Known Allergies.  MEDICATIONS:  Current Outpatient Medications  Medication Sig Dispense Refill   acetaminophen (TYLENOL) 500 MG tablet Take 1 tablet (500 mg total) by mouth every 6 (six) hours as needed. 30 tablet 0   albuterol (VENTOLIN HFA) 108 (90 Base) MCG/ACT inhaler Inhale 2 puffs into the lungs every 6 (six) hours as needed for wheezing or shortness of breath. 18 g 0   atorvastatin (LIPITOR) 40 MG tablet Take 1 tablet (40 mg total) by mouth daily. In place of rosuvastatin due to caner treatment 90 tablet 1   Ca Carbonate-Mag Hydroxide (ROLAIDS PO) Take 1 tablet by mouth as needed.  darolutamide (NUBEQA) 300 MG tablet Take 600 mg by mouth 2 (two) times daily with a meal.     dexamethasone (DECADRON) 4 MG tablet Take one pill AM & PM x 2 days-TAKE the day prior to chemo; and the day after chemo; do not take on the day of chemo. 60 tablet 0   ezetimibe (ZETIA) 10 MG tablet Take 1 tablet (10 mg total) by mouth every morning. 90 tablet 0   Fluticasone-Umeclidin-Vilant (TRELEGY ELLIPTA) 100-62.5-25 MCG/ACT AEPB Inhale 1 puff into the lungs daily. 3 each 1   lidocaine-prilocaine (EMLA) cream Apply on the port. 30 -45 min  prior to port access. 30 g 3   lisinopril (ZESTRIL) 5 MG tablet Take 1 tablet (5 mg total) by mouth daily. For bp and kidney protection 90 tablet 1   meclizine (ANTIVERT) 25 MG tablet TAKE 1 TABLET (25 MG TOTAL)  BY MOUTH DAILY AS NEEDED FOR DIZZINESS. 30 tablet 0   metoprolol succinate (TOPROL-XL) 25 MG 24 hr tablet Take 1 tablet (25 mg total) by mouth daily. Take with or immediately following a meal. 90 tablet 1   tamsulosin (FLOMAX) 0.4 MG CAPS capsule Take 1 capsule (0.4 mg total) by mouth daily after supper. 30 capsule 3   ondansetron (ZOFRAN) 8 MG tablet One pill every 8 hours as needed for nausea/vomitting. 40 tablet 1   prochlorperazine (COMPAZINE) 10 MG tablet Take 1 tablet (10 mg total) by mouth every 6 (six) hours as needed for nausea or vomiting. 40 tablet 1   No current facility-administered medications for this visit.   Facility-Administered Medications Ordered in Other Visits  Medication Dose Route Frequency Provider Last Rate Last Admin   heparin lock flush 100 unit/mL  500 Units Intravenous Once Cammie Sickle, MD        PHYSICAL EXAMINATION: ECOG PERFORMANCE STATUS: 1 - Symptomatic but completely ambulatory  BP 134/69 (BP Location: Right Arm, Patient Position: Sitting, Cuff Size: Normal)   Pulse 87   Temp (!) 97.1 F (36.2 C) (Tympanic)   Ht '5\' 6"'  (1.676 m)   Wt 151 lb 12.8 oz (68.9 kg)   SpO2 99%   BMI 24.50 kg/m   Filed Weights   03/16/22 0922  Weight: 151 lb 12.8 oz (68.9 kg)     Physical Exam Vitals and nursing note reviewed.  HENT:     Head: Normocephalic and atraumatic.     Mouth/Throat:     Pharynx: Oropharynx is clear.  Eyes:     Extraocular Movements: Extraocular movements intact.     Pupils: Pupils are equal, round, and reactive to light.  Cardiovascular:     Rate and Rhythm: Normal rate and regular rhythm.  Pulmonary:     Comments: Decreased breath sounds bilaterally.  Abdominal:     Palpations: Abdomen is soft.  Musculoskeletal:        General: Normal range of motion.     Cervical back: Normal range of motion.  Skin:    General: Skin is warm.  Neurological:     General: No focal deficit present.     Mental Status: He is alert and  oriented to person, place, and time.  Psychiatric:        Behavior: Behavior normal.        Judgment: Judgment normal.    LABORATORY DATA:  I have reviewed the data as listed    Component Value Date/Time   NA 135 03/16/2022 0907   NA 142 08/17/2015 0928   K 4.5 03/16/2022  0907   CL 107 03/16/2022 0907   CO2 21 (L) 03/16/2022 0907   GLUCOSE 121 (H) 03/16/2022 0907   BUN 20 03/16/2022 0907   BUN 17 08/17/2015 0928   CREATININE 1.09 03/16/2022 0907   CREATININE 1.06 09/30/2021 1119   CALCIUM 9.1 03/16/2022 0907   PROT 6.7 03/16/2022 0907   PROT 6.8 08/17/2015 0928   ALBUMIN 3.4 (L) 03/16/2022 0907   ALBUMIN 4.1 08/17/2015 0928   AST 15 03/16/2022 0907   ALT 11 03/16/2022 0907   ALKPHOS 150 (H) 03/16/2022 0907   BILITOT 0.5 03/16/2022 0907   BILITOT 0.4 08/17/2015 0928   GFRNONAA >60 03/16/2022 0907   GFRNONAA 50 (L) 03/03/2021 1050   GFRAA 59 (L) 03/03/2021 1050    No results found for: SPEP, UPEP  Lab Results  Component Value Date   WBC 9.7 03/16/2022   NEUTROABS 8.0 (H) 03/16/2022   HGB 10.2 (L) 03/16/2022   HCT 31.3 (L) 03/16/2022   MCV 101.0 (H) 03/16/2022   PLT 385 03/16/2022      Chemistry      Component Value Date/Time   NA 135 03/16/2022 0907   NA 142 08/17/2015 0928   K 4.5 03/16/2022 0907   CL 107 03/16/2022 0907   CO2 21 (L) 03/16/2022 0907   BUN 20 03/16/2022 0907   BUN 17 08/17/2015 0928   CREATININE 1.09 03/16/2022 0907   CREATININE 1.06 09/30/2021 1119      Component Value Date/Time   CALCIUM 9.1 03/16/2022 0907   ALKPHOS 150 (H) 03/16/2022 0907   AST 15 03/16/2022 0907   ALT 11 03/16/2022 0907   BILITOT 0.5 03/16/2022 0907   BILITOT 0.4 08/17/2015 0928       RADIOGRAPHIC STUDIES: I have personally reviewed the radiological images as listed and agreed with the findings in the report. No results found.   ASSESSMENT & PLAN:  Prostate cancer ([place]) #Stage IV-castrate sensitive prostate cancer with multiple bone metastases/extensive  lymphadenopathy;  lymphadenopathy in the upper right hemipelvis which likely causes severe stenosis or complete occlusion of the right common iliac vein. MRI- liver-1st-March 2023 hemangioma.  #1 loading [place] 12/17/2021.  Taxotere plus ADT.  PSA improving  #Proceed with cycle # 4 of Taxotere [60 mg per metered square]. Labs today reviewed;  acceptable for treatment today; D-2 udenyca.  Continue darolutamide today; 300 mg- 2 pills BID.   # Mucositis:G-1 continue salt/baking soda rinses; STABLE   # PN- from taxotere- G-1 monitor for now. STABLE    # Prostatism symptoms: Continue Flomax;STABLE   # Weight loss/malnutrition-evaluation with nutrition- STABLE   # IV access: functional refilled- emla cream.   5/25- Firmagon- switch to eliagard # DISPOSITION:   #Taxotere today; D-2 udenyca #- in 10 days- cbc/bmp; possible IVFs- 1 lit; dex 8 mg.  # follow up in 3 weeks- MD; labs-cbc/cmp; PSA; taxoetre;d-2 udenyca/- Dr.B      No orders of the defined types were placed in this encounter.  All questions were answered. The patient knows to call the clinic with any problems, questions or concerns.      Cammie Sickle, MD 03/16/2022 9:42 AM

## 2022-03-16 NOTE — Assessment & Plan Note (Addendum)
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 MRI- liver-1st-March 2023 hemangioma.  #1 loading [place] 12/17/2021.  Taxotere plus ADT.  PSA improving  #Proceed with cycle # 4 of Taxotere [60 mg per metered square]. Labs today reviewed;  acceptable for treatment today; D-2 udenyca.  Continue darolutamide today; 300 mg- 2 pills BID.   # Mucositis:G-1 continue salt/baking soda rinses; STABLE   # PN- from taxotere- G-1 monitor for now. STABLE    # Prostatism symptoms: Continue Flomax;STABLE   # Weight loss/malnutrition-evaluation with nutrition- STABLE   # IV access: functional refilled- emla cream.   5/25- Firmagon- switched to eliagard/ordered # DISPOSITION:   #Taxotere today; D-2 udenyca #- in 10 days- cbc/bmp; possible IVFs- 1 lit; dex 8 mg.  # follow up in 3 weeks- MD; labs-cbc/cmp; PSA; taxoetre;d-2 udenyca/- Dr.B

## 2022-03-17 ENCOUNTER — Inpatient Hospital Stay: Payer: Medicare HMO

## 2022-03-17 ENCOUNTER — Encounter: Payer: Self-pay | Admitting: Internal Medicine

## 2022-03-17 DIAGNOSIS — C61 Malignant neoplasm of prostate: Secondary | ICD-10-CM | POA: Diagnosis not present

## 2022-03-17 DIAGNOSIS — C7951 Secondary malignant neoplasm of bone: Secondary | ICD-10-CM | POA: Diagnosis not present

## 2022-03-17 DIAGNOSIS — Z5189 Encounter for other specified aftercare: Secondary | ICD-10-CM | POA: Diagnosis not present

## 2022-03-17 DIAGNOSIS — Z5111 Encounter for antineoplastic chemotherapy: Secondary | ICD-10-CM | POA: Diagnosis not present

## 2022-03-17 MED ORDER — DEGARELIX ACETATE 80 MG ~~LOC~~ SOLR
80.0000 mg | Freq: Once | SUBCUTANEOUS | Status: AC
  Administered 2022-03-17: 80 mg via SUBCUTANEOUS
  Filled 2022-03-17: qty 4

## 2022-03-17 MED ORDER — PEGFILGRASTIM-CBQV 6 MG/0.6ML ~~LOC~~ SOSY
6.0000 mg | PREFILLED_SYRINGE | Freq: Once | SUBCUTANEOUS | Status: AC
  Administered 2022-03-17: 6 mg via SUBCUTANEOUS
  Filled 2022-03-17: qty 0.6

## 2022-03-18 NOTE — Chronic Care Management (AMB) (Signed)
  Chronic Care Management   Outreach Note  03/18/2022 Name: Eric [name] MRN: [id number] DOB: [birth year]  Eric [name] is a 81 y.o. year old male who is a primary care patient of Steele Sizer, MD. I reached out to Eric [name] by phone today in response to a referral sent by Eric [name] primary care provider.  Third unsuccessful telephone outreach was attempted today. The patient was referred to the case management team for assistance with care management and care coordination. The patient's primary care provider has been notified of our unsuccessful attempts to make or maintain contact with the patient. The care management team is pleased to engage with this patient at any time in the future should he/she be interested in assistance from the care management team.   Follow Up Plan: We have been unable to make contact with the patient for follow up. The care management team is available to follow up with the patient after provider conversation with the patient regarding recommendation for care management engagement and subsequent re-referral to the care management team.   Eric [name], [facility]  Direct Dial: [phone]

## 2022-03-25 ENCOUNTER — Inpatient Hospital Stay: Payer: Medicare HMO | Attending: Internal Medicine

## 2022-03-25 ENCOUNTER — Telehealth: Payer: Self-pay | Admitting: *Deleted

## 2022-03-25 ENCOUNTER — Other Ambulatory Visit: Payer: Medicare HMO

## 2022-03-25 ENCOUNTER — Inpatient Hospital Stay: Payer: Medicare HMO

## 2022-03-25 ENCOUNTER — Ambulatory Visit: Payer: Medicare HMO

## 2022-03-25 VITALS — BP 120/60 | HR 85 | Temp 97.6°F | Resp 18

## 2022-03-25 DIAGNOSIS — C61 Malignant neoplasm of prostate: Secondary | ICD-10-CM | POA: Diagnosis not present

## 2022-03-25 DIAGNOSIS — C7951 Secondary malignant neoplasm of bone: Secondary | ICD-10-CM | POA: Insufficient documentation

## 2022-03-25 DIAGNOSIS — Z5189 Encounter for other specified aftercare: Secondary | ICD-10-CM | POA: Diagnosis not present

## 2022-03-25 DIAGNOSIS — K1230 Oral mucositis (ulcerative), unspecified: Secondary | ICD-10-CM | POA: Diagnosis not present

## 2022-03-25 DIAGNOSIS — Z5111 Encounter for antineoplastic chemotherapy: Secondary | ICD-10-CM | POA: Diagnosis not present

## 2022-03-25 LAB — CBC WITH DIFFERENTIAL/PLATELET
Abs Immature Granulocytes: 2.81 10*3/uL — ABNORMAL HIGH (ref 0.00–0.07)
Basophils Absolute: 0 10*3/uL (ref 0.0–0.1)
Basophils Relative: 0 %
Eosinophils Absolute: 0 10*3/uL (ref 0.0–0.5)
Eosinophils Relative: 0 %
HCT: 28.2 % — ABNORMAL LOW (ref 39.0–52.0)
Hemoglobin: 9.1 g/dL — ABNORMAL LOW (ref 13.0–17.0)
Immature Granulocytes: 8 %
Lymphocytes Relative: 8 %
Lymphs Abs: 2.7 10*3/uL (ref 0.7–4.0)
MCH: 33.1 pg (ref 26.0–34.0)
MCHC: 32.3 g/dL (ref 30.0–36.0)
MCV: 102.5 fL — ABNORMAL HIGH (ref 80.0–100.0)
Monocytes Absolute: 1.6 10*3/uL — ABNORMAL HIGH (ref 0.1–1.0)
Monocytes Relative: 5 %
Neutro Abs: 27 10*3/uL — ABNORMAL HIGH (ref 1.7–7.7)
Neutrophils Relative %: 79 %
Platelets: 288 10*3/uL (ref 150–400)
RBC: 2.75 MIL/uL — ABNORMAL LOW (ref 4.22–5.81)
RDW: 18.3 % — ABNORMAL HIGH (ref 11.5–15.5)
Smear Review: NORMAL
WBC: 34.2 10*3/uL — ABNORMAL HIGH (ref 4.0–10.5)
nRBC: 0.4 % — ABNORMAL HIGH (ref 0.0–0.2)

## 2022-03-25 LAB — COMPREHENSIVE METABOLIC PANEL
ALT: 9 U/L (ref 0–44)
AST: 16 U/L (ref 15–41)
Albumin: 2.9 g/dL — ABNORMAL LOW (ref 3.5–5.0)
Alkaline Phosphatase: 135 U/L — ABNORMAL HIGH (ref 38–126)
Anion gap: 6 (ref 5–15)
BUN: 9 mg/dL (ref 8–23)
CO2: 24 mmol/L (ref 22–32)
Calcium: 8.3 mg/dL — ABNORMAL LOW (ref 8.9–10.3)
Chloride: 106 mmol/L (ref 98–111)
Creatinine, Ser: 1.07 mg/dL (ref 0.61–1.24)
GFR, Estimated: >60 mL/min (ref >60)
Glucose, Bld: 114 mg/dL — ABNORMAL HIGH (ref 70–99)
Potassium: 3.7 mmol/L (ref 3.5–5.1)
Sodium: 136 mmol/L (ref 135–145)
Total Bilirubin: 0.2 mg/dL — ABNORMAL LOW (ref 0.3–1.2)
Total Protein: 5.8 g/dL — ABNORMAL LOW (ref 6.5–8.1)

## 2022-03-25 MED ORDER — SODIUM CHLORIDE 0.9% FLUSH
10.0000 mL | Freq: Once | INTRAVENOUS | Status: AC | PRN
  Administered 2022-03-25: 10 mL
  Filled 2022-03-25: qty 10

## 2022-03-25 MED ORDER — SODIUM CHLORIDE 0.9 % IV SOLN
Freq: Once | INTRAVENOUS | Status: AC
  Filled 2022-03-25: qty 250

## 2022-03-25 MED ORDER — SODIUM CHLORIDE 0.9 % IV SOLN
10.0000 mg | Freq: Once | INTRAVENOUS | Status: AC
  Administered 2022-03-25: 10 mg via INTRAVENOUS
  Filled 2022-03-25: qty 10

## 2022-03-25 MED ORDER — HEPARIN SOD (PORK) LOCK FLUSH 100 UNIT/ML IV SOLN
500.0000 [IU] | Freq: Once | INTRAVENOUS | Status: AC | PRN
  Administered 2022-03-25: 500 [IU]
  Filled 2022-03-25: qty 5

## 2022-03-25 NOTE — Progress Notes (Signed)
Received IVF's and decadron. VSS. Discharged to home.

## 2022-03-25 NOTE — Telephone Encounter (Signed)
Tried submitting PA request for Ondansetron but got response that no PA required.  Called pharmacy to find out what notification they get when trying to fill #30.  Pharmacy submitted rx for #30 which is 10 day supply and the quantity went thru and they would get rx ready for patient.  Notified patient that quantity of 30 was resubmitted with no insurance issues and pharmacy will have rx ready.

## 2022-03-25 NOTE — Telephone Encounter (Signed)
Pt daughter told me patient is afraid to take his nausea medication,ondansetron, as ordered as he only had 9 tabs in bottle when they picked it up on Monday. I saw #40 was ordered. I called [facility] in [place] and spoke to pharmacist who said yes that is accurate as it is his insurance that is only allowing 9 tabs per month. Our office will need to run an authorization for him to get more. Will alert MD team of above.

## 2022-03-26 ENCOUNTER — Other Ambulatory Visit: Payer: Self-pay | Admitting: Family Medicine

## 2022-03-26 ENCOUNTER — Other Ambulatory Visit: Payer: Self-pay | Admitting: Internal Medicine

## 2022-03-26 DIAGNOSIS — J432 Centrilobular emphysema: Secondary | ICD-10-CM

## 2022-03-28 ENCOUNTER — Encounter: Payer: Self-pay | Admitting: Internal Medicine

## 2022-03-28 NOTE — Telephone Encounter (Signed)
Requested Prescriptions  Pending Prescriptions Disp Refills  . albuterol (VENTOLIN HFA) 108 (90 Base) MCG/ACT inhaler [Pharmacy Med Name: ALBUTEROL HFA (VENTOLIN) INH] 18 each 5    Sig: TAKE 2 PUFFS BY MOUTH EVERY 6 HOURS AS NEEDED FOR WHEEZE OR SHORTNESS OF BREATH     Pulmonology:  Beta Agonists 2 Passed - 03/26/2022  1:26 AM      Passed - Last BP in normal range    BP Readings from Last 1 Encounters:  03/25/22 120/60         Passed - Last Heart Rate in normal range    Pulse Readings from Last 1 Encounters:  03/25/22 85         Passed - Valid encounter within last 12 months    Recent Outpatient Visits          3 weeks ago Atherosclerosis of aorta [facility]   [facility] Steele Sizer, MD   5 months ago Well adult exam   [facility] Steele Sizer, MD   6 months ago [facility] emph[facility])   [facility] Steele Sizer, MD   1 year ago Essential hypertension   [facility] Steele Sizer, MD   1 year ago Atherosclerosis of aorta [facility])   [facility] Steele Sizer, MD      Future Appointments            In 1 month Steele Sizer, MD [facility], [place]   In 5 months Steele Sizer, MD [facility], [facility]

## 2022-04-04 ENCOUNTER — Other Ambulatory Visit: Payer: Self-pay

## 2022-04-04 DIAGNOSIS — C61 Malignant neoplasm of prostate: Secondary | ICD-10-CM

## 2022-04-06 ENCOUNTER — Inpatient Hospital Stay: Payer: Medicare HMO

## 2022-04-06 ENCOUNTER — Encounter: Payer: Self-pay | Admitting: Internal Medicine

## 2022-04-06 ENCOUNTER — Inpatient Hospital Stay (HOSPITAL_BASED_OUTPATIENT_CLINIC_OR_DEPARTMENT_OTHER): Payer: Medicare HMO | Admitting: Internal Medicine

## 2022-04-06 DIAGNOSIS — Z5189 Encounter for other specified aftercare: Secondary | ICD-10-CM | POA: Diagnosis not present

## 2022-04-06 DIAGNOSIS — C61 Malignant neoplasm of prostate: Secondary | ICD-10-CM

## 2022-04-06 DIAGNOSIS — Z5111 Encounter for antineoplastic chemotherapy: Secondary | ICD-10-CM | POA: Diagnosis not present

## 2022-04-06 DIAGNOSIS — C7951 Secondary malignant neoplasm of bone: Secondary | ICD-10-CM | POA: Diagnosis not present

## 2022-04-06 DIAGNOSIS — K1230 Oral mucositis (ulcerative), unspecified: Secondary | ICD-10-CM | POA: Diagnosis not present

## 2022-04-06 LAB — CBC WITH DIFFERENTIAL/PLATELET
Abs Immature Granulocytes: 0.05 10*3/uL (ref 0.00–0.07)
Basophils Absolute: 0 10*3/uL (ref 0.0–0.1)
Basophils Relative: 0 %
Eosinophils Absolute: 0 10*3/uL (ref 0.0–0.5)
Eosinophils Relative: 0 %
HCT: 31 % — ABNORMAL LOW (ref 39.0–52.0)
Hemoglobin: 10.1 g/dL — ABNORMAL LOW (ref 13.0–17.0)
Immature Granulocytes: 1 %
Lymphocytes Relative: 15 %
Lymphs Abs: 1.4 10*3/uL (ref 0.7–4.0)
MCH: 33.4 pg (ref 26.0–34.0)
MCHC: 32.6 g/dL (ref 30.0–36.0)
MCV: 102.6 fL — ABNORMAL HIGH (ref 80.0–100.0)
Monocytes Absolute: 0.5 10*3/uL (ref 0.1–1.0)
Monocytes Relative: 6 %
Neutro Abs: 7.8 10*3/uL — ABNORMAL HIGH (ref 1.7–7.7)
Neutrophils Relative %: 78 %
Platelets: 373 10*3/uL (ref 150–400)
RBC: 3.02 MIL/uL — ABNORMAL LOW (ref 4.22–5.81)
RDW: 17.9 % — ABNORMAL HIGH (ref 11.5–15.5)
WBC: 9.8 10*3/uL (ref 4.0–10.5)
nRBC: 0 % (ref 0.0–0.2)

## 2022-04-06 LAB — COMPREHENSIVE METABOLIC PANEL
ALT: 9 U/L (ref 0–44)
AST: 13 U/L — ABNORMAL LOW (ref 15–41)
Albumin: 3.4 g/dL — ABNORMAL LOW (ref 3.5–5.0)
Alkaline Phosphatase: 135 U/L — ABNORMAL HIGH (ref 38–126)
Anion gap: 7 (ref 5–15)
BUN: 16 mg/dL (ref 8–23)
CO2: 23 mmol/L (ref 22–32)
Calcium: 8.9 mg/dL (ref 8.9–10.3)
Chloride: 108 mmol/L (ref 98–111)
Creatinine, Ser: 1.06 mg/dL (ref 0.61–1.24)
GFR, Estimated: >60 mL/min (ref >60)
Glucose, Bld: 117 mg/dL — ABNORMAL HIGH (ref 70–99)
Potassium: 4.2 mmol/L (ref 3.5–5.1)
Sodium: 138 mmol/L (ref 135–145)
Total Bilirubin: 0.3 mg/dL (ref 0.3–1.2)
Total Protein: 6.3 g/dL — ABNORMAL LOW (ref 6.5–8.1)

## 2022-04-06 LAB — PSA: Prostatic Specific Antigen: 34.38 ng/mL — ABNORMAL HIGH (ref 0.00–4.00)

## 2022-04-06 MED ORDER — SODIUM CHLORIDE 0.9 % IV SOLN
75.0000 mg/m2 | Freq: Once | INTRAVENOUS | Status: AC
  Administered 2022-04-06: 130 mg via INTRAVENOUS
  Filled 2022-04-06: qty 13

## 2022-04-06 MED ORDER — SODIUM CHLORIDE 0.9 % IV SOLN
Freq: Once | INTRAVENOUS | Status: AC
  Filled 2022-04-06: qty 250

## 2022-04-06 MED ORDER — SODIUM CHLORIDE 0.9 % IV SOLN
10.0000 mg | Freq: Once | INTRAVENOUS | Status: AC
  Administered 2022-04-06: 10 mg via INTRAVENOUS
  Filled 2022-04-06: qty 10

## 2022-04-06 MED ORDER — ONDANSETRON HCL 4 MG/2ML IJ SOLN
8.0000 mg | Freq: Once | INTRAMUSCULAR | Status: AC
  Administered 2022-04-06: 8 mg via INTRAVENOUS
  Filled 2022-04-06: qty 4

## 2022-04-06 MED ORDER — HEPARIN SOD (PORK) LOCK FLUSH 100 UNIT/ML IV SOLN
INTRAVENOUS | Status: AC
  Administered 2022-04-06: 500 [IU]
  Filled 2022-04-06: qty 5

## 2022-04-06 MED ORDER — HEPARIN SOD (PORK) LOCK FLUSH 100 UNIT/ML IV SOLN
500.0000 [IU] | Freq: Once | INTRAVENOUS | Status: AC | PRN
  Filled 2022-04-06: qty 5

## 2022-04-06 MED FILL — Dexamethasone Sodium Phosphate Inj 100 MG/10ML: INTRAMUSCULAR | Qty: 1 | Status: AC

## 2022-04-06 NOTE — Progress Notes (Signed)
Nutrition Follow-up:   Patient with metastatic prostate cancer with bone mets.  Patient on firmagon and docetaxel and prednisone.   Met with patient during infusion.  Patient reports that appetite is good right now but will decrease for about 1 week after treatment today.  Says that he has not been taking nausea medications on a regular basis but will do that this time.  Drinking ensure shakes.      Medications: zofran and compazine  Labs: reviewed  Anthropometrics:    Weight 148 lb 3.2 oz 151 lb on 5/24 151 lb on 4/12 149 lb on 3/22 151 lb on 1/18   NUTRITION DIAGNOSIS: Inadequate food intake stable    INTERVENTION:  Encouraged taking nausea medication to help improve symptoms so he can eat Set reminder to eat during week following treatment Continue oral nutrition supplements    MONITORING, EVALUATION, GOAL: weight trends, intake   NEXT VISIT: as needed  Esmee Fallaw B. Zenia Resides, [place], [address] Registered Dietitian [phone]

## 2022-04-06 NOTE — Assessment & Plan Note (Addendum)
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 MRI- liver-1st-March 2023 hemangioma.  #1 loading [place] 12/17/2021.  Taxotere plus ADT.  PSA improving  #Proceed with cycle # 5 of Taxotere [60 mg per metered square]. Labs today reviewed;  acceptable for treatment today; D-2 udenyca.  Continue darolutamide today; 300 mg- 2 pills BID.  Again reviewed the chemotherapy plan is for 6 cycles.  But will continue the pills; and Eligard indefinitely and the progression of disease.  Understands again his cancer cannot be cured.  # Mucositis:G-1 continue salt/baking soda rinses; STABLE   # PN- from taxotere- G-1 monitor for now. STABLE    # Prostatism symptoms: Continue Flomax; STABLE   # Weight loss/malnutrition-evaluation with nutrition-  STABLE   # IV access: functional refilled- emla cream.   july5th- eliagard/ordered  # DISPOSITION:    #Taxotere today; D-2 udenyca  #- in 10 days- cbc/bmp; possible IVFs- 1 lit; dex 8 mg.   # follow up in 3 weeks- MD; labs-cbc/cmp; PSA; taxoetre;ELIGARD- d-2 udenyca/- Dr.B

## 2022-04-06 NOTE — Progress Notes (Signed)
Decrease in appetite for a week after treatment, 3 lb wt loss since 03/16/22.  Does take Zofran and Compazine at home with relief of nausea.  Would like to know which is best to take after treatment.

## 2022-04-06 NOTE — Patient Instructions (Signed)
[facility]  Discharge Instructions: Thank you for choosing [facility] to provide your oncology and hematology care.  If you have a lab appointment with the Cancer Center, please go directly to the Cancer Center and check in at the registration area.  Wear comfortable clothing and clothing appropriate for easy access to any Portacath or PICC line.   We strive to give you quality time with your provider. You may need to reschedule your appointment if you arrive late (15 or more minutes).  Arriving late affects you and other patients whose appointments are after yours.  Also, if you miss three or more appointments without notifying the office, you may be dismissed from the clinic at the provider's discretion.      For prescription refill requests, have your pharmacy contact our office and allow 72 hours for refills to be completed.    Today you received the following chemotherapy and/or immunotherapy agents: Taxotere.   To help prevent nausea and vomiting after your treatment, we encourage you to take your nausea medication as directed.  BELOW ARE SYMPTOMS THAT SHOULD BE REPORTED IMMEDIATELY: *FEVER GREATER THAN 100.4 F (38 C) OR HIGHER *CHILLS OR SWEATING *NAUSEA AND VOMITING THAT IS NOT CONTROLLED WITH YOUR NAUSEA MEDICATION *UNUSUAL SHORTNESS OF BREATH *UNUSUAL BRUISING OR BLEEDING *URINARY PROBLEMS (pain or burning when urinating, or frequent urination) *BOWEL PROBLEMS (unusual diarrhea, constipation, pain near the anus) TENDERNESS IN MOUTH AND THROAT WITH OR WITHOUT PRESENCE OF ULCERS (sore throat, sores in mouth, or a toothache) UNUSUAL RASH, SWELLING OR PAIN  UNUSUAL VAGINAL DISCHARGE OR ITCHING   Items with * indicate a potential emergency and should be followed up as soon as possible or go to the Emergency Department if any problems should occur.  Please show the CHEMOTHERAPY ALERT CARD or IMMUNOTHERAPY ALERT CARD at check-in to the  Emergency Department and triage nurse.  Should you have questions after your visit or need to cancel or reschedule your appointment, please contact [facility]  [phone] and follow the prompts.  Office hours are 8:00 a.m. to 4:30 p.m. Monday - Friday. Please note that voicemails left after 4:00 p.m. may not be returned until the following business day.  We are closed weekends and major holidays. You have access to a nurse at all times for urgent questions. Please call the main number to the clinic [phone] and follow the prompts.  For any non-urgent questions, you may also contact your provider using MyChart. We now offer e-Visits for anyone 18 and older to request care online for non-urgent symptoms. For details visit [email or web address].   Also download the MyChart app! Go to the app store, search "MyChart", open the app, select [facility] [place], and log in with your MyChart username and password.  Masks are optional in the cancer centers. If you would like for your care team to wear a mask while they are taking care of you, please let them know. For doctor visits, patients may have with them one support person who is at least 80 years old. At this time, visitors are not allowed in the infusion area.   

## 2022-04-06 NOTE — Progress Notes (Signed)
[address] [facility] OFFICE PROGRESS NOTE  Patient Care Team: Steele Sizer, MD as PCP - General (Family Medicine) Cammie Sickle, MD as Consulting Physician (Oncology) Abbie Sons, MD (Urology)   Cancer Staging  Prostate cancer [facility]) Staging form: Prostate, AJCC 8th Edition - Clinical: Stage IVB (cT2c, cN1, cM1, PSA: 374, Grade Group: 5) - Signed by Cammie Sickle, MD on 12/13/2021 Prostate specific antigen (PSA) range: 20 or greater Histologic grading system: 5 grade system    Oncology History Overview Note  compatible with widespread metastatic prostate cancer, including innumerable sclerotic osseous lesions and extensive lymphadenopathy in the pelvis and retroperitoneum, as detailed above. 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 2. There is also an indeterminate hypovascular lesion in segment 5 of the liver which may represent a metastatic lesion. This could be better characterized with follow-up abdominal MRI with and without IV gadolinium if clinically appropriate. 3. Aortic atherosclerosis with mild fusiform aneurysmal dilatation of the infrarenal abdominal aorta measuring up to 3.2 x 3.0 cm. Recommend follow-up ultrasound every 3 years. This recommendation follows ACR consensus guidelines: White Paper of the ACR Incidental Findings Committee II on Vascular Findings. J Am Coll Radiol 2013; 10:789-794. 4. Bladder wall appears mildly thickened, likely related to bladder outlet obstruction given the enlarged prostate gland. 5. Severe colonic diverticulosis without evidence of acute diverticulitis at this time.  IMPRESSION: There are numerous foci of abnormal tracer uptake in the axial and proximal appendicular skeleton consistent with extensive skeletal metastatic disease.     #Stage IV castrate sensitive prostate cancer-December/20  2023-MRI negative for any liver lesions.; FEB 24th, 2022-Firmagon loading dose;  # march 24th-Taxoeter with udenyca; 4/12- add darolutamide  # DEC 2022- 22 [[place]; Urology]   Prostate cancer ([place])  12/13/2021 Initial Diagnosis   Prostate cancer ([place])   12/13/2021 Cancer Staging   Staging form: Prostate, AJCC 8th Edition - Clinical: Stage IVB (cT2c, cN1, cM1, PSA: 374, Grade Group: 5) - Signed by Cammie Sickle, MD on 12/13/2021 Prostate specific antigen (PSA) range: 20 or greater Histologic grading system: 5 grade system   01/12/2022 -  Chemotherapy   Patient is on Treatment Plan : PROSTATE Docetaxel + Prednisone q21d      Genetic Testing   Negative genetic testing. No pathogenic variants identified on the [facility] Common Hereditary Cancers +RNA panel. VUS in BRIP1 called c.485G>T identified. The report date is 03/04/2022.  The Common Hereditary Cancers Panel + RNA offered by [facility] includes sequencing and/or deletion duplication testing of the following 47 genes: APC, ATM, AXIN2, BARD1, BMPR1A, BRCA1, BRCA2, BRIP1, CDH1, CDKN2A (p14ARF), CDKN2A (p16INK4a), CKD4, CHEK2, CTNNA1, DICER1, EPCAM (Deletion/duplication testing only), GREM1 (promoter region deletion/duplication testing only), KIT, MEN1, MLH1, MSH2, MSH3, MSH6, MUTYH, NBN, NF1, NHTL1, PALB2, PDGFRA, PMS2, POLD1, POLE, PTEN, RAD50, RAD51C, RAD51D, SDHB, SDHC, SDHD, SMAD4, SMARCA4. STK11, TP53, TSC1, TSC2, and VHL.  The following genes were evaluated for sequence changes only: SDHA and HOXB13 c.251G>A variant only.      HISTORY OF PRESENT ILLNESS: Ambulating with a cane.  With grand -daughter.    Eric [name] 81 y.o.  male pleasant patient above history of castrate sensitive metastatic prostate cancer currently on Taxotere chemotherapy + Darolutamide is here for follow-up.  Complains of mild loss of appetite.  Otherwise denies any swelling in the legs or diarrhea.  Complains of mild tingling and numbness in the  extremities.  No  shortness of breath or cough.  Mild nausea but no vomiting.  Review of Systems  Constitutional:  Positive for malaise/fatigue and weight loss. Negative for chills, diaphoresis and fever.  HENT:  Negative for nosebleeds and sore throat.   Eyes:  Negative for double vision.  Respiratory:  Negative for cough, hemoptysis, sputum production, shortness of breath and wheezing.   Cardiovascular:  Negative for chest pain, palpitations, orthopnea and leg swelling.  Gastrointestinal:  Negative for abdominal pain, blood in stool, constipation, diarrhea, heartburn, melena, nausea and vomiting.  Genitourinary:  Negative for dysuria, frequency and urgency.  Musculoskeletal:  Positive for back pain and joint pain.  Skin: Negative.  Negative for itching and rash.  Neurological:  Negative for dizziness, tingling, focal weakness, weakness and headaches.  Endo/Heme/Allergies:  Does not bruise/bleed easily.  Psychiatric/Behavioral:  Negative for depression. The patient is not nervous/anxious and does not have insomnia.       PAST MEDICAL HISTORY :  Past Medical History:  Diagnosis Date   COPD (chronic obstructive pulmonary disease) ([place])    Dyspnea    with exertion   Family history of cancer    GERD (gastroesophageal reflux disease)    occ   Hyperlipidemia    Hypertension     PAST SURGICAL HISTORY :   Past Surgical History:  Procedure Laterality Date   COLONOSCOPY     HEMORROIDECTOMY     IR IMAGING GUIDED PORT INSERTION  12/20/2021   PROSTATE BIOPSY N/A 10/26/2021   Procedure: PROSTATE BIOPSY;  Surgeon: Abbie Sons, MD;  Location: [facility] ORS;  Service: Urology;  Laterality: N/A;   TRANSRECTAL ULTRASOUND N/A 10/26/2021   Procedure: TRANSRECTAL ULTRASOUND;  Surgeon: Abbie Sons, MD;  Location: [facility] ORS;  Service: Urology;  Laterality: N/A;    FAMILY HISTORY :   Family History  Problem Relation Age of Onset   Heart disease Mother    COPD Father    Cancer Brother 12        blood cancer; unk exact type   Cancer Maternal Aunt        unk type   Cancer Paternal Aunt        unk type   Cancer Paternal Uncle        one unk type; one had throat   Cancer Cousin        unk types    SOCIAL HISTORY:   Social History   Tobacco Use   Smoking status: Every Day    Packs/day: 0.50    Years: 60.00    Total pack years: 30.00    Types: Cigarettes   Smokeless tobacco: Never   Tobacco comments:    smoking cessation information provided  Vaping Use   Vaping Use: Never used  Substance Use Topics   Alcohol use: Not Currently   Drug use: Not Currently    ALLERGIES:  has No Known Allergies.  MEDICATIONS:  Current Outpatient Medications  Medication Sig Dispense Refill   acetaminophen (TYLENOL) 500 MG tablet Take 1 tablet (500 mg total) by mouth every 6 (six) hours as needed. 30 tablet 0   albuterol (VENTOLIN HFA) 108 (90 Base) MCG/ACT inhaler TAKE 2 PUFFS BY MOUTH EVERY 6 HOURS AS NEEDED FOR WHEEZE OR SHORTNESS OF BREATH 18 each 5   atorvastatin (LIPITOR) 40 MG tablet Take 1 tablet (40 mg total) by mouth daily. In place of rosuvastatin due to caner treatment 90 tablet 1   Ca Carbonate-Mag Hydroxide (ROLAIDS PO) Take 1 tablet by mouth as  needed.     darolutamide (NUBEQA) 300 MG tablet Take 600 mg by mouth 2 (two) times daily with a meal.     dexamethasone (DECADRON) 4 MG tablet Take one pill AM & PM x 2 days-TAKE the day prior to chemo; and the day after chemo; do not take on the day of chemo. 60 tablet 0   ezetimibe (ZETIA) 10 MG tablet Take 1 tablet (10 mg total) by mouth every morning. 90 tablet 0   Fluticasone-Umeclidin-Vilant (TRELEGY ELLIPTA) 100-62.5-25 MCG/ACT AEPB Inhale 1 puff into the lungs daily. 3 each 1   lidocaine-prilocaine (EMLA) cream Apply on the port. 30 -45 min  prior to port access. 30 g 3   lisinopril (ZESTRIL) 5 MG tablet Take 1 tablet (5 mg total) by mouth daily. For bp and kidney protection 90 tablet 1   meclizine (ANTIVERT) 25 MG tablet TAKE  1 TABLET (25 MG TOTAL) BY MOUTH DAILY AS NEEDED FOR DIZZINESS. 30 tablet 0   metoprolol succinate (TOPROL-XL) 25 MG 24 hr tablet Take 1 tablet (25 mg total) by mouth daily. Take with or immediately following a meal. 90 tablet 1   ondansetron (ZOFRAN) 8 MG tablet One pill every 8 hours as needed for nausea/vomitting. 40 tablet 1   prochlorperazine (COMPAZINE) 10 MG tablet Take 1 tablet (10 mg total) by mouth every 6 (six) hours as needed for nausea or vomiting. 40 tablet 1   tamsulosin (FLOMAX) 0.4 MG CAPS capsule TAKE 1 CAPSULE BY MOUTH EVERY DAY AFTER SUPPER 90 capsule 1   No current facility-administered medications for this visit.   Facility-Administered Medications Ordered in Other Visits  Medication Dose Route Frequency Provider Last Rate Last Admin   DOCEtaxel (TAXOTERE) 130 mg in sodium chloride 0.9 % 250 mL chemo infusion  75 mg/m2 (Treatment Plan Recorded) Intravenous Once Charlaine Dalton R, MD       heparin lock flush 100 UNIT/ML injection            heparin lock flush 100 unit/mL  500 Units Intracatheter Once PRN Cammie Sickle, MD        PHYSICAL EXAMINATION: ECOG PERFORMANCE STATUS: 1 - Symptomatic but completely ambulatory  BP 136/78   Pulse 83   Temp (!) 97.4 F (36.3 C)   Resp 16   Ht '5\' 6"'  (1.676 m)   Wt 148 lb 3.2 oz (67.2 kg)   BMI 23.92 kg/m   Filed Weights   04/06/22 0920  Weight: 148 lb 3.2 oz (67.2 kg)     Physical Exam Vitals and nursing note reviewed.  HENT:     Head: Normocephalic and atraumatic.     Mouth/Throat:     Pharynx: Oropharynx is clear.  Eyes:     Extraocular Movements: Extraocular movements intact.     Pupils: Pupils are equal, round, and reactive to light.  Cardiovascular:     Rate and Rhythm: Normal rate and regular rhythm.  Pulmonary:     Comments: Decreased breath sounds bilaterally.  Abdominal:     Palpations: Abdomen is soft.  Musculoskeletal:        General: Normal range of motion.     Cervical back: Normal  range of motion.  Skin:    General: Skin is warm.  Neurological:     General: No focal deficit present.     Mental Status: He is alert and oriented to person, place, and time.  Psychiatric:        Behavior: Behavior normal.  Judgment: Judgment normal.     LABORATORY DATA:  I have reviewed the data as listed    Component Value Date/Time   NA 138 04/06/2022 0921   NA 142 08/17/2015 0928   K 4.2 04/06/2022 0921   CL 108 04/06/2022 0921   CO2 23 04/06/2022 0921   GLUCOSE 117 (H) 04/06/2022 0921   BUN 16 04/06/2022 0921   BUN 17 08/17/2015 0928   CREATININE 1.06 04/06/2022 0921   CREATININE 1.06 09/30/2021 1119   CALCIUM 8.9 04/06/2022 0921   PROT 6.3 (L) 04/06/2022 0921   PROT 6.8 08/17/2015 0928   ALBUMIN 3.4 (L) 04/06/2022 0921   ALBUMIN 4.1 08/17/2015 0928   AST 13 (L) 04/06/2022 0921   ALT 9 04/06/2022 0921   ALKPHOS 135 (H) 04/06/2022 0921   BILITOT 0.3 04/06/2022 0921   BILITOT 0.4 08/17/2015 0928   GFRNONAA >60 04/06/2022 0921   GFRNONAA 50 (L) 03/03/2021 1050   GFRAA 59 (L) 03/03/2021 1050    No results found for: "SPEP", "UPEP"  Lab Results  Component Value Date   WBC 9.8 04/06/2022   NEUTROABS 7.8 (H) 04/06/2022   HGB 10.1 (L) 04/06/2022   HCT 31.0 (L) 04/06/2022   MCV 102.6 (H) 04/06/2022   PLT 373 04/06/2022      Chemistry      Component Value Date/Time   NA 138 04/06/2022 0921   NA 142 08/17/2015 0928   K 4.2 04/06/2022 0921   CL 108 04/06/2022 0921   CO2 23 04/06/2022 0921   BUN 16 04/06/2022 0921   BUN 17 08/17/2015 0928   CREATININE 1.06 04/06/2022 0921   CREATININE 1.06 09/30/2021 1119      Component Value Date/Time   CALCIUM 8.9 04/06/2022 0921   ALKPHOS 135 (H) 04/06/2022 0921   AST 13 (L) 04/06/2022 0921   ALT 9 04/06/2022 0921   BILITOT 0.3 04/06/2022 0921   BILITOT 0.4 08/17/2015 0928       RADIOGRAPHIC STUDIES: I have personally reviewed the radiological images as listed and agreed with the findings in the  report. No results found.   ASSESSMENT & PLAN:  Prostate cancer ([place]) #Stage IV-castrate sensitive prostate cancer with multiple bone metastases/extensive lymphadenopathy;  lymphadenopathy in the upper right hemipelvis which likely causes severe stenosis or complete occlusion of the right common iliac vein. MRI- liver-1st-March 2023 hemangioma.  #1 loading [place] 12/17/2021.  Taxotere plus ADT.  PSA improving  #Proceed with cycle # 5 of Taxotere [60 mg per metered square]. Labs today reviewed;  acceptable for treatment today; D-2 udenyca.  Continue darolutamide today; 300 mg- 2 pills BID.  Again reviewed the chemotherapy plan is for 6 cycles.  But will continue the pills; and Eligard indefinitely and the progression of disease.  Understands again his cancer cannot be cured.  # Mucositis:G-1 continue salt/baking soda rinses; STABLE   # PN- from taxotere- G-1 monitor for now. STABLE    # Prostatism symptoms: Continue Flomax; STABLE   # Weight loss/malnutrition-evaluation with nutrition-  STABLE   # IV access: functional refilled- emla cream.   july5th- eliagard/ordered  # DISPOSITION:    #Taxotere today; D-2 udenyca  #- in 10 days- cbc/bmp; possible IVFs- 1 lit; dex 8 mg.   # follow up in 3 weeks- MD; labs-cbc/cmp; PSA; taxoetre;ELIGARD- d-2 udenyca/- Dr.B      Orders Placed This Encounter  Procedures   CBC with Differential/Platelet    Standing Status:   Future    Standing Expiration Date:  04/07/2023   Comprehensive metabolic panel    Standing Status:   Future    Standing Expiration Date:   04/07/2023   PSA    Standing Status:   Future    Standing Expiration Date:   04/07/2023   All questions were answered. The patient knows to call the clinic with any problems, questions or concerns.      Cammie Sickle, MD 04/06/2022 10:38 AM

## 2022-04-07 ENCOUNTER — Inpatient Hospital Stay: Payer: Medicare HMO

## 2022-04-07 DIAGNOSIS — C61 Malignant neoplasm of prostate: Secondary | ICD-10-CM

## 2022-04-07 DIAGNOSIS — K1230 Oral mucositis (ulcerative), unspecified: Secondary | ICD-10-CM | POA: Diagnosis not present

## 2022-04-07 DIAGNOSIS — Z5111 Encounter for antineoplastic chemotherapy: Secondary | ICD-10-CM | POA: Diagnosis not present

## 2022-04-07 DIAGNOSIS — C7951 Secondary malignant neoplasm of bone: Secondary | ICD-10-CM | POA: Diagnosis not present

## 2022-04-07 DIAGNOSIS — Z5189 Encounter for other specified aftercare: Secondary | ICD-10-CM | POA: Diagnosis not present

## 2022-04-07 MED ORDER — PEGFILGRASTIM-CBQV 6 MG/0.6ML ~~LOC~~ SOSY
6.0000 mg | PREFILLED_SYRINGE | Freq: Once | SUBCUTANEOUS | Status: AC
  Administered 2022-04-07: 6 mg via SUBCUTANEOUS
  Filled 2022-04-07: qty 0.6

## 2022-04-10 ENCOUNTER — Emergency Department
Admission: EM | Admit: 2022-04-10 | Discharge: 2022-04-10 | Disposition: A | Discharge status: Home / Self Care | Payer: Medicare HMO | Attending: Emergency Medicine | Admitting: Emergency Medicine

## 2022-04-10 ENCOUNTER — Other Ambulatory Visit: Payer: Self-pay

## 2022-04-10 DIAGNOSIS — R339 Retention of urine, unspecified: Secondary | ICD-10-CM | POA: Diagnosis not present

## 2022-04-10 DIAGNOSIS — J449 Chronic obstructive pulmonary disease, unspecified: Secondary | ICD-10-CM | POA: Insufficient documentation

## 2022-04-10 DIAGNOSIS — I10 Essential (primary) hypertension: Secondary | ICD-10-CM | POA: Insufficient documentation

## 2022-04-10 HISTORY — DX: Malignant (primary) neoplasm, unspecified: C80.1

## 2022-04-10 LAB — CBC WITH DIFFERENTIAL/PLATELET
Abs Immature Granulocytes: 4.37 10*3/uL — ABNORMAL HIGH (ref 0.00–0.07)
Basophils Absolute: 0.1 10*3/uL (ref 0.0–0.1)
Basophils Relative: 1 %
Eosinophils Absolute: 0 10*3/uL (ref 0.0–0.5)
Eosinophils Relative: 0 %
HCT: 33 % — ABNORMAL LOW (ref 39.0–52.0)
Hemoglobin: 10.2 g/dL — ABNORMAL LOW (ref 13.0–17.0)
Immature Granulocytes: 21 %
Lymphocytes Relative: 8 %
Lymphs Abs: 1.6 10*3/uL (ref 0.7–4.0)
MCH: 32.4 pg (ref 26.0–34.0)
MCHC: 30.9 g/dL (ref 30.0–36.0)
MCV: 104.8 fL — ABNORMAL HIGH (ref 80.0–100.0)
Monocytes Absolute: 0.2 10*3/uL (ref 0.1–1.0)
Monocytes Relative: 1 %
Neutro Abs: 14.8 10*3/uL — ABNORMAL HIGH (ref 1.7–7.7)
Neutrophils Relative %: 69 %
Platelets: 166 10*3/uL (ref 150–400)
RBC: 3.15 MIL/uL — ABNORMAL LOW (ref 4.22–5.81)
RDW: 17.4 % — ABNORMAL HIGH (ref 11.5–15.5)
WBC: 21.1 10*3/uL — ABNORMAL HIGH (ref 4.0–10.5)
nRBC: 0 % (ref 0.0–0.2)

## 2022-04-10 LAB — BASIC METABOLIC PANEL
Anion gap: 8 (ref 5–15)
BUN: 22 mg/dL (ref 8–23)
CO2: 22 mmol/L (ref 22–32)
Calcium: 9.1 mg/dL (ref 8.9–10.3)
Chloride: 106 mmol/L (ref 98–111)
Creatinine, Ser: 1.14 mg/dL (ref 0.61–1.24)
GFR, Estimated: >60 mL/min (ref >60)
Glucose, Bld: 112 mg/dL — ABNORMAL HIGH (ref 70–99)
Potassium: 4 mmol/L (ref 3.5–5.1)
Sodium: 136 mmol/L (ref 135–145)

## 2022-04-10 LAB — URINALYSIS, ROUTINE W REFLEX MICROSCOPIC
Bilirubin Urine: NEGATIVE
Glucose, UA: NEGATIVE mg/dL
Hgb urine dipstick: NEGATIVE
Ketones, ur: NEGATIVE mg/dL
Leukocytes,Ua: NEGATIVE
Nitrite: NEGATIVE
Protein, ur: NEGATIVE mg/dL
Specific Gravity, Urine: 1.017 (ref 1.005–1.030)
pH: 5 (ref 5.0–8.0)

## 2022-04-10 MED ORDER — LIDOCAINE HCL URETHRAL/MUCOSAL 2 % EX GEL
1.0000 | Freq: Once | CUTANEOUS | Status: AC
  Administered 2022-04-10: 1 via URETHRAL
  Filled 2022-04-10: qty 10

## 2022-04-10 NOTE — ED Triage Notes (Signed)
Pt was sent here from [facility] for urinary retention- states that he has not been abel to urinate since this AM- pt states that he dribbled some out this Am but nothing beyond that and that he still feels full- pt is getting active chemo for prostate cancer

## 2022-04-10 NOTE — ED Provider Notes (Signed)
[facility] Provider Note    Event Date/Time   First MD Initiated Contact with Patient 04/10/22 1556     (approximate)  History   Chief Complaint: Urinary Retention  HPI  Eric [name] is a 81 y.o. male with a past medical history of COPD, gastric reflux, hypertension/hyperlipidemia, prostate cancer on chemotherapy, last received chemotherapy approximately 1 week ago followed by Neupogen presents to the emergency department for urinary retention.  According to the patient he last fully urinated yesterday states he has been able to urinate a very small amount this morning and nothing since.  Patient states significant abdominal fullness/distention from his bladder along with discomfort in the lower abdomen.  Patient states prior to this he was not having any dysuria.  Patient does have prostate cancer but has never had urinary retention.  Physical Exam   Triage Vital Signs: ED Triage Vitals  Enc Vitals Group     BP 04/10/22 1458 122/67     Pulse Rate 04/10/22 1458 (!) 108     Resp 04/10/22 1458 20     Temp 04/10/22 1458 98.3 F (36.8 C)     Temp Source 04/10/22 1458 Oral     SpO2 04/10/22 1458 97 %     Weight 04/10/22 1459 145 lb (65.8 kg)     Height 04/10/22 1459 '5\' 6"'$  (1.676 m)     Head Circumference --      Peak Flow --      Pain Score 04/10/22 1459 0     Pain Loc --      Pain Edu? --      Excl. in [place]? --     Most recent vital signs: Vitals:   04/10/22 1458  BP: 122/67  Pulse: (!) 108  Resp: 20  Temp: 98.3 F (36.8 C)  SpO2: 97%    General: Awake, no distress.  CV:  Good peripheral perfusion.  Regular rate and rhythm  Resp:  Normal effort.  Equal breath sounds bilaterally.  Abd:  Lower abdominal distention/fullness with mild tenderness consistent with a full/distended bladder.  Upper abdomen is benign.   ED Results / Procedures / Treatments   MEDICATIONS ORDERED IN ED: Medications - No data to display   IMPRESSION / MDM /  [place] / ED COURSE  I reviewed the triage vital signs and the nursing notes.  Patient's presentation is most consistent with acute presentation with potential threat to life or bodily function.  Patient presents emergency department unable to urinate.  Patient states he last fully urinated yesterday.  On physical exam patient does have abdominal fullness in the suprapubic area with some tenderness to palpation consistent with a very distended bladder.  Patient's lab work does show significant leukocytosis of 21,000 however patient received Neupogen recently.  Chemistry is reassuring including normal renal function.  We will place a Foley catheter we will send a urine sample as well as urine culture we will continue to closely monitor.  Patient agreeable to plan of care.  Patient's urinalysis is normal.  No sign of infection.  Patient has drained 1.6 L out of his Foley catheter.  States he feels much better.  We will discharge the patient home with urology follow-up.  Patient is to call his oncologist tomorrow as well and update them.  Patient and wife agreeable to plan of care.  FINAL CLINICAL IMPRESSION(S) / ED DIAGNOSES   Acute urinary retention   Note:  This document was prepared using Dragon  voice recognition software and may include unintentional dictation errors.   Harvest Dark, MD 04/10/22 1801

## 2022-04-10 NOTE — Discharge Instructions (Addendum)
Please call the number provided for urology tomorrow morning to arrange an appointment in approximately 7 to 10 days.  Please also inform your oncologist tomorrow morning of your Foley catheter placement.  Return to the emergency department for any fever or any other symptom personally concerning to yourself.

## 2022-04-10 NOTE — ED Provider Triage Note (Signed)
Emergency Medicine Provider Triage Evaluation Note  Eric [name] , a 81 y.o. male  with a history of prostate cancer undergoing chemo, CKD stage III, was evaluated in triage.  Pt complains of inability to urinate. Reports he only dribbled a little urine today. No N/V, no fever/chills. Also feels his hemorrhoids are "hanging done" and he is having trouble moving his bowels because of it.  Patient Active Problem List   Diagnosis Date Noted   Genetic testing 03/08/2022   Metastatic cancer to bone ([place]) 03/04/2022   Lower urinary tract symptoms (LUTS) 03/04/2022   Pure hypercholesterolemia 03/04/2022   Dysthymia 03/04/2022   Family history of cancer 02/02/2022   Prostate cancer ([place]) 12/13/2021   Coronary artery calcification seen on CAT scan 06/28/2018   Centrilobular emphysema ([place]) 06/28/2018   Chronic kidney disease, stage III (moderate) ([facility]) 06/28/2018   Atherosclerosis of aorta ([place]) 06/19/2018   Chronic low back pain with sciatica 02/07/2018   Hyperglycemia 05/03/2017   Simple chronic bronchitis ([place]) 05/07/2015   Essential hypertension 05/07/2015   Tobacco abuse 05/07/2015     Review of Systems  Positive: Urinary retention Negative: Back pain, fever, N/V  Physical Exam  There were no vitals taken for this visit. Gen:   Awake, no distress   Resp:  Normal effort  MSK:   Moves extremities without difficulty  Other:    Medical Decision Making  Medically screening exam initiated at 2:56 PM.  Appropriate orders placed.  AZIR MUZYKA was informed that the remainder of the evaluation will be completed by another provider, this initial triage assessment does not replace that evaluation, and the importance of remaining in the ED until their evaluation is complete.    Marquette Old, PA-C 04/10/22 1501

## 2022-04-10 NOTE — ED Notes (Signed)
Bladder scan shows 1103

## 2022-04-10 NOTE — ED Notes (Signed)
Pt to ED for urinary retention since yesterday. Pt has abdominal distention present. Pt has history of prostate cancer, last chemo was last week. Pt states he was only able to expel a little urine.   Pt is A&Ox4

## 2022-04-12 LAB — URINE CULTURE: Culture: NO GROWTH

## 2022-04-15 ENCOUNTER — Inpatient Hospital Stay: Payer: Medicare HMO

## 2022-04-15 VITALS — BP 129/76 | HR 85 | Temp 97.7°F | Resp 20

## 2022-04-15 DIAGNOSIS — C7951 Secondary malignant neoplasm of bone: Secondary | ICD-10-CM | POA: Diagnosis not present

## 2022-04-15 DIAGNOSIS — Z5111 Encounter for antineoplastic chemotherapy: Secondary | ICD-10-CM | POA: Diagnosis not present

## 2022-04-15 DIAGNOSIS — C61 Malignant neoplasm of prostate: Secondary | ICD-10-CM

## 2022-04-15 DIAGNOSIS — Z5189 Encounter for other specified aftercare: Secondary | ICD-10-CM | POA: Diagnosis not present

## 2022-04-15 DIAGNOSIS — K1230 Oral mucositis (ulcerative), unspecified: Secondary | ICD-10-CM | POA: Diagnosis not present

## 2022-04-15 LAB — CBC WITH DIFFERENTIAL/PLATELET
Abs Immature Granulocytes: 1.88 10*3/uL — ABNORMAL HIGH (ref 0.00–0.07)
Basophils Absolute: 0.2 10*3/uL — ABNORMAL HIGH (ref 0.0–0.1)
Basophils Relative: 1 %
Eosinophils Absolute: 0 10*3/uL (ref 0.0–0.5)
Eosinophils Relative: 0 %
HCT: 31.2 % — ABNORMAL LOW (ref 39.0–52.0)
Hemoglobin: 10.2 g/dL — ABNORMAL LOW (ref 13.0–17.0)
Immature Granulocytes: 6 %
Lymphocytes Relative: 10 %
Lymphs Abs: 3.2 10*3/uL (ref 0.7–4.0)
MCH: 33.9 pg (ref 26.0–34.0)
MCHC: 32.7 g/dL (ref 30.0–36.0)
MCV: 103.7 fL — ABNORMAL HIGH (ref 80.0–100.0)
Monocytes Absolute: 1.5 10*3/uL — ABNORMAL HIGH (ref 0.1–1.0)
Monocytes Relative: 5 %
Neutro Abs: 23.5 10*3/uL — ABNORMAL HIGH (ref 1.7–7.7)
Neutrophils Relative %: 78 %
Platelets: 291 10*3/uL (ref 150–400)
RBC: 3.01 MIL/uL — ABNORMAL LOW (ref 4.22–5.81)
RDW: 16.9 % — ABNORMAL HIGH (ref 11.5–15.5)
Smear Review: NORMAL
WBC Morphology: 11
WBC: 30.2 10*3/uL — ABNORMAL HIGH (ref 4.0–10.5)
nRBC: 0.2 % (ref 0.0–0.2)

## 2022-04-15 LAB — BASIC METABOLIC PANEL
Anion gap: 6 (ref 5–15)
BUN: 11 mg/dL (ref 8–23)
CO2: 24 mmol/L (ref 22–32)
Calcium: 8.5 mg/dL — ABNORMAL LOW (ref 8.9–10.3)
Chloride: 105 mmol/L (ref 98–111)
Creatinine, Ser: 1.19 mg/dL (ref 0.61–1.24)
GFR, Estimated: >60 mL/min (ref >60)
Glucose, Bld: 96 mg/dL (ref 70–99)
Potassium: 3.5 mmol/L (ref 3.5–5.1)
Sodium: 135 mmol/L (ref 135–145)

## 2022-04-15 MED ORDER — SODIUM CHLORIDE 0.9% FLUSH
10.0000 mL | Freq: Once | INTRAVENOUS | Status: DC
  Filled 2022-04-15: qty 10

## 2022-04-15 MED ORDER — SODIUM CHLORIDE 0.9 % IV SOLN
Freq: Once | INTRAVENOUS | Status: AC
  Filled 2022-04-15: qty 250

## 2022-04-15 MED ORDER — HEPARIN SOD (PORK) LOCK FLUSH 100 UNIT/ML IV SOLN
500.0000 [IU] | Freq: Once | INTRAVENOUS | Status: AC
  Administered 2022-04-15: 500 [IU] via INTRAVENOUS
  Filled 2022-04-15: qty 5

## 2022-04-15 MED ORDER — SODIUM CHLORIDE 0.9 % IV SOLN
10.0000 mg | Freq: Once | INTRAVENOUS | Status: AC
  Administered 2022-04-15: 10 mg via INTRAVENOUS
  Filled 2022-04-15: qty 10

## 2022-04-20 ENCOUNTER — Ambulatory Visit: Payer: Medicare HMO | Admitting: Physician Assistant

## 2022-04-20 DIAGNOSIS — R339 Retention of urine, unspecified: Secondary | ICD-10-CM

## 2022-04-20 LAB — BLADDER SCAN AMB NON-IMAGING

## 2022-04-20 NOTE — Progress Notes (Signed)
04/20/2022 2:57 PM   Eric [name] [birth year] [id number]  CC: Chief Complaint  Patient presents with   Urinary Retention    V&T   HPI: Eric [name] is a 81 y.o. male with metastatic prostate cancer on Taxotere chemotherapy and darolutamide per Dr. [name] who presents today for voiding trial.   He was seen in the emergency department 10 days ago with reports of urinary retention.  Foley catheter was placed.  UA was benign.  Urine culture finalized with no growth.  Today he reports no acute concerns.  He thinks he may have been constipated at the time that he was in the emergency department, but he cannot recall exactly.  Foley catheter removed in the morning, see separate procedure note for details.  He returned to clinic in the afternoon having voided.  PVR 213 mL.  PMH: Past Medical History:  Diagnosis Date   Cancer (HCC)    COPD (chronic obstructive pulmonary disease) (HCC)    Dyspnea    with exertion   Family history of cancer    GERD (gastroesophageal reflux disease)    occ   Hyperlipidemia    Hypertension     Surgical History: Past Surgical History:  Procedure Laterality Date   COLONOSCOPY     HEMORROIDECTOMY     IR IMAGING GUIDED PORT INSERTION  12/20/2021   PROSTATE BIOPSY N/A 10/26/2021   Procedure: PROSTATE BIOPSY;  Surgeon: Riki Altes, MD;  Location: [facility] ORS;  Service: Urology;  Laterality: N/A;   TRANSRECTAL ULTRASOUND N/A 10/26/2021   Procedure: TRANSRECTAL ULTRASOUND;  Surgeon: Riki Altes, MD;  Location: [facility] ORS;  Service: Urology;  Laterality: N/A;    Home Medications:  Allergies as of 04/20/2022   No Known Allergies      Medication List        Accurate as of April 20, 2022  2:57 PM. If you have any questions, ask your nurse or doctor.          acetaminophen 500 MG tablet Commonly known as: TYLENOL Take 1 tablet (500 mg total) by mouth every 6 (six) hours as needed.   albuterol 108 (90 Base) MCG/ACT  inhaler Commonly known as: VENTOLIN HFA TAKE 2 PUFFS BY MOUTH EVERY 6 HOURS AS NEEDED FOR WHEEZE OR SHORTNESS OF BREATH   atorvastatin 40 MG tablet Commonly known as: LIPITOR Take 1 tablet (40 mg total) by mouth daily. In place of rosuvastatin due to caner treatment   darolutamide 300 MG tablet Commonly known as: NUBEQA Take 600 mg by mouth 2 (two) times daily with a meal.   dexamethasone 4 MG tablet Commonly known as: DECADRON Take one pill AM & PM x 2 days-TAKE the day prior to chemo; and the day after chemo; do not take on the day of chemo.   ezetimibe 10 MG tablet Commonly known as: ZETIA Take 1 tablet (10 mg total) by mouth every morning.   lidocaine-prilocaine cream Commonly known as: EMLA Apply on the port. 30 -45 min  prior to port access.   lisinopril 5 MG tablet Commonly known as: ZESTRIL Take 1 tablet (5 mg total) by mouth daily. For bp and kidney protection   meclizine 25 MG tablet Commonly known as: ANTIVERT TAKE 1 TABLET (25 MG TOTAL) BY MOUTH DAILY AS NEEDED FOR DIZZINESS.   metoprolol succinate 25 MG 24 hr tablet Commonly known as: TOPROL-XL Take 1 tablet (25 mg total) by mouth daily. Take with or immediately following a meal.   ondansetron  8 MG tablet Commonly known as: ZOFRAN One pill every 8 hours as needed for nausea/vomitting.   prochlorperazine 10 MG tablet Commonly known as: COMPAZINE Take 1 tablet (10 mg total) by mouth every 6 (six) hours as needed for nausea or vomiting.   ROLAIDS PO Take 1 tablet by mouth as needed.   tamsulosin 0.4 MG Caps capsule Commonly known as: FLOMAX TAKE 1 CAPSULE BY MOUTH EVERY DAY AFTER SUPPER   Trelegy Ellipta 100-62.5-25 MCG/ACT Aepb Generic drug: Fluticasone-Umeclidin-Vilant Inhale 1 puff into the lungs daily.        Allergies:  No Known Allergies  Family History: Family History  Problem Relation Age of Onset   Heart disease Mother    COPD Father    Cancer Brother 87       blood cancer; unk  exact type   Cancer Maternal Aunt        unk type   Cancer Paternal Aunt        unk type   Cancer Paternal Uncle        one unk type; one had throat   Cancer Cousin        unk types    Social History:   reports that he has been smoking cigarettes. He has a 30.00 pack-year smoking history. He has never used smokeless tobacco. He reports that he does not currently use alcohol. He reports that he does not currently use drugs.  Physical Exam: There were no vitals taken for this visit.  Constitutional:  Alert and oriented, no acute distress, nontoxic appearing HEENT: [place], AT Cardiovascular: No clubbing, cyanosis, or edema Respiratory: Normal respiratory effort, no increased work of breathing Skin: No rashes, bruises or suspicious lesions Neurologic: Grossly intact, no focal deficits, moving all 4 extremities Psychiatric: Normal mood and affect  Laboratory Data: Results for orders placed or performed in visit on 04/20/22  BLADDER SCAN AMB NON-IMAGING  Result Value Ref Range   Scan Result    Assessment & Plan:   1. Urinary retention Voiding trial passed.  Possible constipation contributory.  We will plan for repeat PVR in 4 weeks to assess stability.  He is in agreement with this plan. - BLADDER SCAN AMB NON-IMAGING  Return in about 4 weeks (around 05/18/2022) for Repeat PVR.  Carman Ching, PA-C  [facility] [address] [place], [place] [address] [phone]

## 2022-04-20 NOTE — Progress Notes (Unsigned)
Catheter Removal  Patient is present today for a catheter removal.   24m of water was drained from the balloon. A 14FR foley cath was removed from the bladder no complications were noted . Patient tolerated well.  Performed by: SFonnie Jarvis CMA  Follow up/ Additional notes: PM PVR

## 2022-04-27 ENCOUNTER — Inpatient Hospital Stay (HOSPITAL_BASED_OUTPATIENT_CLINIC_OR_DEPARTMENT_OTHER): Payer: Medicare HMO | Admitting: Internal Medicine

## 2022-04-27 ENCOUNTER — Inpatient Hospital Stay: Payer: Medicare HMO | Attending: Internal Medicine

## 2022-04-27 ENCOUNTER — Encounter: Payer: Self-pay | Admitting: Internal Medicine

## 2022-04-27 ENCOUNTER — Inpatient Hospital Stay: Payer: Medicare HMO

## 2022-04-27 VITALS — BP 133/71 | HR 75 | Resp 16

## 2022-04-27 DIAGNOSIS — Z5111 Encounter for antineoplastic chemotherapy: Secondary | ICD-10-CM | POA: Insufficient documentation

## 2022-04-27 DIAGNOSIS — C7951 Secondary malignant neoplasm of bone: Secondary | ICD-10-CM | POA: Diagnosis not present

## 2022-04-27 DIAGNOSIS — Z5189 Encounter for other specified aftercare: Secondary | ICD-10-CM | POA: Insufficient documentation

## 2022-04-27 DIAGNOSIS — C61 Malignant neoplasm of prostate: Secondary | ICD-10-CM | POA: Insufficient documentation

## 2022-04-27 LAB — COMPREHENSIVE METABOLIC PANEL
ALT: 9 U/L (ref 0–44)
AST: 15 U/L (ref 15–41)
Albumin: 3.3 g/dL — ABNORMAL LOW (ref 3.5–5.0)
Alkaline Phosphatase: 108 U/L (ref 38–126)
Anion gap: 8 (ref 5–15)
BUN: 16 mg/dL (ref 8–23)
CO2: 23 mmol/L (ref 22–32)
Calcium: 8.8 mg/dL — ABNORMAL LOW (ref 8.9–10.3)
Chloride: 104 mmol/L (ref 98–111)
Creatinine, Ser: 0.8 mg/dL (ref 0.61–1.24)
GFR, Estimated: >60 mL/min (ref >60)
Glucose, Bld: 146 mg/dL — ABNORMAL HIGH (ref 70–99)
Potassium: 4.5 mmol/L (ref 3.5–5.1)
Sodium: 135 mmol/L (ref 135–145)
Total Bilirubin: 0.6 mg/dL (ref 0.3–1.2)
Total Protein: 6 g/dL — ABNORMAL LOW (ref 6.5–8.1)

## 2022-04-27 LAB — CBC WITH DIFFERENTIAL/PLATELET
Abs Immature Granulocytes: 0.06 10*3/uL (ref 0.00–0.07)
Basophils Absolute: 0 10*3/uL (ref 0.0–0.1)
Basophils Relative: 0 %
Eosinophils Absolute: 0 10*3/uL (ref 0.0–0.5)
Eosinophils Relative: 0 %
HCT: 31.3 % — ABNORMAL LOW (ref 39.0–52.0)
Hemoglobin: 10 g/dL — ABNORMAL LOW (ref 13.0–17.0)
Immature Granulocytes: 1 %
Lymphocytes Relative: 11 %
Lymphs Abs: 1.2 10*3/uL (ref 0.7–4.0)
MCH: 33.3 pg (ref 26.0–34.0)
MCHC: 31.9 g/dL (ref 30.0–36.0)
MCV: 104.3 fL — ABNORMAL HIGH (ref 80.0–100.0)
Monocytes Absolute: 0.4 10*3/uL (ref 0.1–1.0)
Monocytes Relative: 4 %
Neutro Abs: 9.1 10*3/uL — ABNORMAL HIGH (ref 1.7–7.7)
Neutrophils Relative %: 84 %
Platelets: 335 10*3/uL (ref 150–400)
RBC: 3 MIL/uL — ABNORMAL LOW (ref 4.22–5.81)
RDW: 16.8 % — ABNORMAL HIGH (ref 11.5–15.5)
WBC: 10.7 10*3/uL — ABNORMAL HIGH (ref 4.0–10.5)
nRBC: 0 % (ref 0.0–0.2)

## 2022-04-27 LAB — PSA: Prostatic Specific Antigen: 27.37 ng/mL — ABNORMAL HIGH (ref 0.00–4.00)

## 2022-04-27 MED ORDER — LEUPROLIDE ACETATE (6 MONTH) 45 MG ~~LOC~~ KIT
45.0000 mg | PACK | Freq: Once | SUBCUTANEOUS | Status: AC
  Administered 2022-04-27: 45 mg via SUBCUTANEOUS
  Filled 2022-04-27: qty 45

## 2022-04-27 MED ORDER — SODIUM CHLORIDE 0.9 % IV SOLN
75.0000 mg/m2 | Freq: Once | INTRAVENOUS | Status: AC
  Administered 2022-04-27: 130 mg via INTRAVENOUS
  Filled 2022-04-27: qty 13

## 2022-04-27 MED ORDER — HEPARIN SOD (PORK) LOCK FLUSH 100 UNIT/ML IV SOLN
500.0000 [IU] | Freq: Once | INTRAVENOUS | Status: AC | PRN
  Administered 2022-04-27: 500 [IU]
  Filled 2022-04-27: qty 5

## 2022-04-27 MED ORDER — SODIUM CHLORIDE 0.9 % IV SOLN
10.0000 mg | Freq: Once | INTRAVENOUS | Status: AC
  Administered 2022-04-27: 10 mg via INTRAVENOUS
  Filled 2022-04-27: qty 1

## 2022-04-27 MED ORDER — SODIUM CHLORIDE 0.9 % IV SOLN
Freq: Once | INTRAVENOUS | Status: AC
  Filled 2022-04-27: qty 250

## 2022-04-27 MED ORDER — ONDANSETRON HCL 4 MG/2ML IJ SOLN
8.0000 mg | Freq: Once | INTRAMUSCULAR | Status: AC
  Administered 2022-04-27: 8 mg via INTRAVENOUS
  Filled 2022-04-27: qty 4

## 2022-04-27 MED ORDER — SODIUM CHLORIDE 0.9% FLUSH
10.0000 mL | INTRAVENOUS | Status: DC | PRN
  Administered 2022-04-27: 10 mL
  Filled 2022-04-27: qty 10

## 2022-04-27 MED FILL — Dexamethasone Sodium Phosphate Inj 100 MG/10ML: INTRAMUSCULAR | Qty: 1 | Status: AC

## 2022-04-27 NOTE — Progress Notes (Signed)
[facility] OFFICE PROGRESS NOTE  Patient Care Team: Steele Sizer, MD as PCP - General (Family Medicine) Cammie Sickle, MD as Consulting Physician (Oncology) Abbie Sons, MD (Urology)   Cancer Staging  Prostate cancer [facility]) Staging form: Prostate, AJCC 8th Edition - Clinical: Stage IVB (cT2c, cN1, cM1, PSA: 374, Grade Group: 5) - Signed by Cammie Sickle, MD on 12/13/2021 Prostate specific antigen (PSA) range: 20 or greater Histologic grading system: 5 grade system    Oncology History Overview Note  compatible with widespread metastatic prostate cancer, including innumerable sclerotic osseous lesions and extensive lymphadenopathy in the pelvis and retroperitoneum, as detailed above. 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 2. There is also an indeterminate hypovascular lesion in segment 5 of the liver which may represent a metastatic lesion. This could be better characterized with follow-up abdominal MRI with and without IV gadolinium if clinically appropriate. 3. Aortic atherosclerosis with mild fusiform aneurysmal dilatation of the infrarenal abdominal aorta measuring up to 3.2 x 3.0 cm. Recommend follow-up ultrasound every 3 years. This recommendation follows ACR consensus guidelines: White Paper of the ACR Incidental Findings Committee II on Vascular Findings. J Am Coll Radiol 2013; 10:789-794. 4. Bladder wall appears mildly thickened, likely related to bladder outlet obstruction given the enlarged prostate gland. 5. Severe colonic diverticulosis without evidence of acute diverticulitis at this time.  IMPRESSION: There are numerous foci of abnormal tracer uptake in the axial and proximal appendicular skeleton consistent with extensive skeletal metastatic disease.     #Stage IV castrate sensitive prostate cancer-December/20  2023-MRI negative for any liver lesions.; FEB 24th, 2022-Firmagon loading dose;  # march 24th-Taxoeter with udenyca; 4/12- add darolutamide  # DEC 2022- 8 [[place]; Urology]   Prostate cancer ([place])  12/13/2021 Initial Diagnosis   Prostate cancer ([place])   12/13/2021 Cancer Staging   Staging form: Prostate, AJCC 8th Edition - Clinical: Stage IVB (cT2c, cN1, cM1, PSA: 374, Grade Group: 5) - Signed by Cammie Sickle, MD on 12/13/2021 Prostate specific antigen (PSA) range: 20 or greater Histologic grading system: 5 grade system   01/12/2022 -  Chemotherapy   Patient is on Treatment Plan : PROSTATE Docetaxel + Prednisone q21d      Genetic Testing   Negative genetic testing. No pathogenic variants identified on the [facility] Common Hereditary Cancers +RNA panel. VUS in BRIP1 called c.485G>T identified. The report date is 03/04/2022.  The Common Hereditary Cancers Panel + RNA offered by [facility] includes sequencing and/or deletion duplication testing of the following 47 genes: APC, ATM, AXIN2, BARD1, BMPR1A, BRCA1, BRCA2, BRIP1, CDH1, CDKN2A (p14ARF), CDKN2A (p16INK4a), CKD4, CHEK2, CTNNA1, DICER1, EPCAM (Deletion/duplication testing only), GREM1 (promoter region deletion/duplication testing only), KIT, MEN1, MLH1, MSH2, MSH3, MSH6, MUTYH, NBN, NF1, NHTL1, PALB2, PDGFRA, PMS2, POLD1, POLE, PTEN, RAD50, RAD51C, RAD51D, SDHB, SDHC, SDHD, SMAD4, SMARCA4. STK11, TP53, TSC1, TSC2, and VHL.  The following genes were evaluated for sequence changes only: SDHA and HOXB13 c.251G>A variant only.      HISTORY OF PRESENT ILLNESS: Ambulating with a cane.  With daughter.    Eric [name] 81 y.o.  male pleasant patient above history of castrate sensitive metastatic prostate cancer currently on Taxotere chemotherapy + Darolutamide is here for follow-up.  Patient denies any tingling or numbness in the extremities.  Denies any nausea vomiting.  Mild fatigue.  He feels improved post steroids/IV  fluids.  Review of Systems  Constitutional:  Positive for malaise/fatigue and weight loss. Negative for chills, diaphoresis and fever.  HENT:  Negative for nosebleeds and sore throat.   Eyes:  Negative for double vision.  Respiratory:  Negative for cough, hemoptysis, sputum production, shortness of breath and wheezing.   Cardiovascular:  Negative for chest pain, palpitations, orthopnea and leg swelling.  Gastrointestinal:  Negative for abdominal pain, blood in stool, constipation, diarrhea, heartburn, melena, nausea and vomiting.  Genitourinary:  Negative for dysuria, frequency and urgency.  Musculoskeletal:  Positive for back pain and joint pain.  Skin: Negative.  Negative for itching and rash.  Neurological:  Negative for dizziness, tingling, focal weakness, weakness and headaches.  Endo/Heme/Allergies:  Does not bruise/bleed easily.  Psychiatric/Behavioral:  Negative for depression. The patient is not nervous/anxious and does not have insomnia.       PAST MEDICAL HISTORY :  Past Medical History:  Diagnosis Date   Cancer ([place])    COPD (chronic obstructive pulmonary disease) ([place])    Dyspnea    with exertion   Family history of cancer    GERD (gastroesophageal reflux disease)    occ   Hyperlipidemia    Hypertension     PAST SURGICAL HISTORY :   Past Surgical History:  Procedure Laterality Date   COLONOSCOPY     HEMORROIDECTOMY     IR IMAGING GUIDED PORT INSERTION  12/20/2021   PROSTATE BIOPSY N/A 10/26/2021   Procedure: PROSTATE BIOPSY;  Surgeon: Abbie Sons, MD;  Location: [facility] ORS;  Service: Urology;  Laterality: N/A;   TRANSRECTAL ULTRASOUND N/A 10/26/2021   Procedure: TRANSRECTAL ULTRASOUND;  Surgeon: Abbie Sons, MD;  Location: [facility] ORS;  Service: Urology;  Laterality: N/A;    FAMILY HISTORY :   Family History  Problem Relation Age of Onset   Heart disease Mother    COPD Father    Cancer Brother 80       blood cancer; unk exact type   Cancer Maternal Aunt         unk type   Cancer Paternal Aunt        unk type   Cancer Paternal Uncle        one unk type; one had throat   Cancer Cousin        unk types    SOCIAL HISTORY:   Social History   Tobacco Use   Smoking status: Every Day    Packs/day: 0.50    Years: 60.00    Total pack years: 30.00    Types: Cigarettes   Smokeless tobacco: Never   Tobacco comments:    smoking cessation information provided  Vaping Use   Vaping Use: Never used  Substance Use Topics   Alcohol use: Not Currently   Drug use: Not Currently    ALLERGIES:  has No Known Allergies.  MEDICATIONS:  Current Outpatient Medications  Medication Sig Dispense Refill   acetaminophen (TYLENOL) 500 MG tablet Take 1 tablet (500 mg total) by mouth every 6 (six) hours as needed. 30 tablet 0   albuterol (VENTOLIN HFA) 108 (90 Base) MCG/ACT inhaler TAKE 2 PUFFS BY MOUTH EVERY 6 HOURS AS NEEDED FOR WHEEZE OR SHORTNESS OF BREATH 18 each 5   atorvastatin (LIPITOR) 40 MG tablet Take 1 tablet (40 mg total) by mouth daily. In place of rosuvastatin due to caner treatment 90 tablet 1   Ca Carbonate-Mag Hydroxide (ROLAIDS PO) Take 1 tablet by mouth as needed.     darolutamide (NUBEQA) 300 MG tablet Take  600 mg by mouth 2 (two) times daily with a meal.     dexamethasone (DECADRON) 4 MG tablet Take one pill AM & PM x 2 days-TAKE the day prior to chemo; and the day after chemo; do not take on the day of chemo. 60 tablet 0   ezetimibe (ZETIA) 10 MG tablet Take 1 tablet (10 mg total) by mouth every morning. 90 tablet 0   Fluticasone-Umeclidin-Vilant (TRELEGY ELLIPTA) 100-62.5-25 MCG/ACT AEPB Inhale 1 puff into the lungs daily. 3 each 1   lidocaine-prilocaine (EMLA) cream Apply on the port. 30 -45 min  prior to port access. 30 g 3   lisinopril (ZESTRIL) 5 MG tablet Take 1 tablet (5 mg total) by mouth daily. For bp and kidney protection 90 tablet 1   meclizine (ANTIVERT) 25 MG tablet TAKE 1 TABLET (25 MG TOTAL) BY MOUTH DAILY AS NEEDED FOR  DIZZINESS. 30 tablet 0   metoprolol succinate (TOPROL-XL) 25 MG 24 hr tablet Take 1 tablet (25 mg total) by mouth daily. Take with or immediately following a meal. 90 tablet 1   ondansetron (ZOFRAN) 8 MG tablet One pill every 8 hours as needed for nausea/vomitting. 40 tablet 1   prochlorperazine (COMPAZINE) 10 MG tablet Take 1 tablet (10 mg total) by mouth every 6 (six) hours as needed for nausea or vomiting. 40 tablet 1   tamsulosin (FLOMAX) 0.4 MG CAPS capsule TAKE 1 CAPSULE BY MOUTH EVERY DAY AFTER SUPPER 90 capsule 1   No current facility-administered medications for this visit.   Facility-Administered Medications Ordered in Other Visits  Medication Dose Route Frequency Provider Last Rate Last Admin   DOCEtaxel (TAXOTERE) 130 mg in sodium chloride 0.9 % 250 mL chemo infusion  75 mg/m2 (Treatment Plan Recorded) Intravenous Once Cammie Sickle, MD 263 mL/hr at 04/27/22 1130 130 mg at 04/27/22 1130   heparin lock flush 100 unit/mL  500 Units Intracatheter Once PRN Cammie Sickle, MD       sodium chloride flush (NS) 0.9 % injection 10 mL  10 mL Intracatheter PRN Cammie Sickle, MD   10 mL at 04/27/22 1020    PHYSICAL EXAMINATION: ECOG PERFORMANCE STATUS: 1 - Symptomatic but completely ambulatory  BP 117/68 (BP Location: Right Arm, Patient Position: Sitting, Cuff Size: Normal)   Pulse 87   Temp (!) 97.2 F (36.2 C) (Tympanic)   Ht 5' 6" (1.676 m)   Wt 147 lb 3.2 oz (66.8 kg)   SpO2 100%   BMI 23.76 kg/m   Filed Weights   04/27/22 0859  Weight: 147 lb 3.2 oz (66.8 kg)     Physical Exam Vitals and nursing note reviewed.  HENT:     Head: Normocephalic and atraumatic.     Mouth/Throat:     Pharynx: Oropharynx is clear.  Eyes:     Extraocular Movements: Extraocular movements intact.     Pupils: Pupils are equal, round, and reactive to light.  Cardiovascular:     Rate and Rhythm: Normal rate and regular rhythm.  Pulmonary:     Comments: Decreased breath  sounds bilaterally.  Abdominal:     Palpations: Abdomen is soft.  Musculoskeletal:        General: Normal range of motion.     Cervical back: Normal range of motion.  Skin:    General: Skin is warm.  Neurological:     General: No focal deficit present.     Mental Status: He is alert and oriented to person, place, and time.  Psychiatric:        Behavior: Behavior normal.        Judgment: Judgment normal.     LABORATORY DATA:  I have reviewed the data as listed    Component Value Date/Time   NA 135 04/27/2022 0902   NA 142 08/17/2015 0928   K 4.5 04/27/2022 0902   CL 104 04/27/2022 0902   CO2 23 04/27/2022 0902   GLUCOSE 146 (H) 04/27/2022 0902   BUN 16 04/27/2022 0902   BUN 17 08/17/2015 0928   CREATININE 0.80 04/27/2022 0902   CREATININE 1.06 09/30/2021 1119   CALCIUM 8.8 (L) 04/27/2022 0902   PROT 6.0 (L) 04/27/2022 0902   PROT 6.8 08/17/2015 0928   ALBUMIN 3.3 (L) 04/27/2022 0902   ALBUMIN 4.1 08/17/2015 0928   AST 15 04/27/2022 0902   ALT 9 04/27/2022 0902   ALKPHOS 108 04/27/2022 0902   BILITOT 0.6 04/27/2022 0902   BILITOT 0.4 08/17/2015 0928   GFRNONAA >60 04/27/2022 0902   GFRNONAA 50 (L) 03/03/2021 1050   GFRAA 59 (L) 03/03/2021 1050    No results found for: "SPEP", "UPEP"  Lab Results  Component Value Date   WBC 10.7 (H) 04/27/2022   NEUTROABS 9.1 (H) 04/27/2022   HGB 10.0 (L) 04/27/2022   HCT 31.3 (L) 04/27/2022   MCV 104.3 (H) 04/27/2022   PLT 335 04/27/2022      Chemistry      Component Value Date/Time   NA 135 04/27/2022 0902   NA 142 08/17/2015 0928   K 4.5 04/27/2022 0902   CL 104 04/27/2022 0902   CO2 23 04/27/2022 0902   BUN 16 04/27/2022 0902   BUN 17 08/17/2015 0928   CREATININE 0.80 04/27/2022 0902   CREATININE 1.06 09/30/2021 1119      Component Value Date/Time   CALCIUM 8.8 (L) 04/27/2022 0902   ALKPHOS 108 04/27/2022 0902   AST 15 04/27/2022 0902   ALT 9 04/27/2022 0902   BILITOT 0.6 04/27/2022 0902   BILITOT 0.4  08/17/2015 0928       RADIOGRAPHIC STUDIES: I have personally reviewed the radiological images as listed and agreed with the findings in the report. No results found.   ASSESSMENT & PLAN:  Prostate cancer ([place]) #Stage IV-castrate sensitive prostate cancer with multiple bone metastases/extensive lymphadenopathy;  lymphadenopathy in the upper right hemipelvis which likely causes severe stenosis or complete occlusion of the right common iliac vein. MRI- liver-1st-March 2023 hemangioma.  #1 loading [place] 12/17/2021.  Taxotere plus ADT.  PSA improving  #Proceed with cycle # 6 of Taxotere [60 mg per metered square]. Labs today reviewed;  acceptable for treatment today; D-2 udenyca.  Continue darolutamide today; 300 mg- 2 pills BID.  Again reviewed the chemotherapy plan is for 6 cycles.   # Mucositis:G-1 continue salt/baking soda rinses; STABLE   # PN- from taxotere- G-1 monitor for now. STABLE    # Prostatism symptoms: Continue Flomax; STABLE   # Weight loss/malnutrition-evaluation with nutrition-  STABLE   # Acute urinary retention: s/p Folye- resolved.   # IV access: functional refilled- emla cream.     # DISPOSITION:    #Taxotere; Eligard  today; D-2 udenyca;   #- in 10 days- cbc/bmp; possible IVFs- 1 lit; dex 8 mg.   # follow up in 6 weeks- MD; labs/pot flush-cbc/cmp; PSA;  - Dr.B      Orders Placed This Encounter  Procedures   Basic metabolic panel    Standing Status:   Future  Standing Expiration Date:   04/28/2023   CBC with Differential/Platelet    Standing Status:   Standing    Number of Occurrences:   2    Standing Expiration Date:   04/28/2023   Comprehensive metabolic panel    Standing Status:   Future    Standing Expiration Date:   04/28/2023   PSA    Standing Status:   Future    Standing Expiration Date:   04/28/2023   All questions were answered. The patient knows to call the clinic with any problems, questions or concerns.      Cammie Sickle,  MD 04/27/2022 12:17 PM

## 2022-04-27 NOTE — Assessment & Plan Note (Addendum)
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 MRI- liver-1st-March 2023 hemangioma.  #1 loading [place] 12/17/2021.  Taxotere plus ADT.  PSA improving  #Proceed with cycle # 6 of Taxotere [60 mg per metered square]. Labs today reviewed;  acceptable for treatment today; D-2 udenyca.  Continue darolutamide today; 300 mg- 2 pills BID.  Again reviewed the chemotherapy plan is for 6 cycles.   # Mucositis:G-1 continue salt/baking soda rinses; STABLE   # PN- from taxotere- G-1 monitor for now. STABLE    # Prostatism symptoms: Continue Flomax; STABLE   # Weight loss/malnutrition-evaluation with nutrition-  STABLE   # Acute urinary retention: s/p Folye- resolved.   # IV access: functional refilled- emla cream.     # DISPOSITION:    #Taxotere; Eligard  today; D-2 udenyca;   #- in 10 days- cbc/bmp; possible IVFs- 1 lit; dex 8 mg.   # follow up in 6 weeks- MD; labs/pot flush-cbc/cmp; PSA;  - Dr.B

## 2022-04-28 ENCOUNTER — Inpatient Hospital Stay: Payer: Medicare HMO

## 2022-04-28 DIAGNOSIS — C61 Malignant neoplasm of prostate: Secondary | ICD-10-CM | POA: Diagnosis not present

## 2022-04-28 DIAGNOSIS — Z5189 Encounter for other specified aftercare: Secondary | ICD-10-CM | POA: Diagnosis not present

## 2022-04-28 DIAGNOSIS — Z5111 Encounter for antineoplastic chemotherapy: Secondary | ICD-10-CM | POA: Diagnosis not present

## 2022-04-28 DIAGNOSIS — C7951 Secondary malignant neoplasm of bone: Secondary | ICD-10-CM | POA: Diagnosis not present

## 2022-04-28 MED ORDER — PEGFILGRASTIM-CBQV 6 MG/0.6ML ~~LOC~~ SOSY
6.0000 mg | PREFILLED_SYRINGE | Freq: Once | SUBCUTANEOUS | Status: AC
  Administered 2022-04-28: 6 mg via SUBCUTANEOUS
  Filled 2022-04-28: qty 0.6

## 2022-05-03 ENCOUNTER — Inpatient Hospital Stay: Payer: Medicare HMO

## 2022-05-03 VITALS — BP 129/71 | HR 80 | Temp 97.4°F | Resp 16

## 2022-05-03 DIAGNOSIS — C61 Malignant neoplasm of prostate: Secondary | ICD-10-CM | POA: Diagnosis not present

## 2022-05-03 DIAGNOSIS — Z5189 Encounter for other specified aftercare: Secondary | ICD-10-CM | POA: Diagnosis not present

## 2022-05-03 DIAGNOSIS — Z5111 Encounter for antineoplastic chemotherapy: Secondary | ICD-10-CM | POA: Diagnosis not present

## 2022-05-03 DIAGNOSIS — C7951 Secondary malignant neoplasm of bone: Secondary | ICD-10-CM | POA: Diagnosis not present

## 2022-05-03 LAB — CBC WITH DIFFERENTIAL/PLATELET
Abs Immature Granulocytes: 0.37 10*3/uL — ABNORMAL HIGH (ref 0.00–0.07)
Basophils Absolute: 0 10*3/uL (ref 0.0–0.1)
Basophils Relative: 0 %
Eosinophils Absolute: 0 10*3/uL (ref 0.0–0.5)
Eosinophils Relative: 0 %
HCT: 31.2 % — ABNORMAL LOW (ref 39.0–52.0)
Hemoglobin: 10.1 g/dL — ABNORMAL LOW (ref 13.0–17.0)
Immature Granulocytes: 4 %
Lymphocytes Relative: 25 %
Lymphs Abs: 2.1 10*3/uL (ref 0.7–4.0)
MCH: 33.7 pg (ref 26.0–34.0)
MCHC: 32.4 g/dL (ref 30.0–36.0)
MCV: 104 fL — ABNORMAL HIGH (ref 80.0–100.0)
Monocytes Absolute: 1 10*3/uL (ref 0.1–1.0)
Monocytes Relative: 12 %
Neutro Abs: 4.8 10*3/uL (ref 1.7–7.7)
Neutrophils Relative %: 59 %
Platelets: 175 10*3/uL (ref 150–400)
RBC: 3 MIL/uL — ABNORMAL LOW (ref 4.22–5.81)
RDW: 15.9 % — ABNORMAL HIGH (ref 11.5–15.5)
Smear Review: NORMAL
WBC: 8.3 10*3/uL (ref 4.0–10.5)
nRBC: 0 % (ref 0.0–0.2)

## 2022-05-03 LAB — BASIC METABOLIC PANEL
Anion gap: 7 (ref 5–15)
BUN: 12 mg/dL (ref 8–23)
CO2: 23 mmol/L (ref 22–32)
Calcium: 8.5 mg/dL — ABNORMAL LOW (ref 8.9–10.3)
Chloride: 105 mmol/L (ref 98–111)
Creatinine, Ser: 1.05 mg/dL (ref 0.61–1.24)
GFR, Estimated: >60 mL/min (ref >60)
Glucose, Bld: 92 mg/dL (ref 70–99)
Potassium: 3.8 mmol/L (ref 3.5–5.1)
Sodium: 135 mmol/L (ref 135–145)

## 2022-05-03 MED ORDER — HEPARIN SOD (PORK) LOCK FLUSH 100 UNIT/ML IV SOLN
500.0000 [IU] | Freq: Once | INTRAVENOUS | Status: AC | PRN
  Administered 2022-05-03: 500 [IU]
  Filled 2022-05-03: qty 5

## 2022-05-03 MED ORDER — SODIUM CHLORIDE 0.9 % IV SOLN
10.0000 mg | Freq: Once | INTRAVENOUS | Status: AC
  Administered 2022-05-03: 10 mg via INTRAVENOUS
  Filled 2022-05-03: qty 1

## 2022-05-03 MED ORDER — SODIUM CHLORIDE 0.9 % IV SOLN
Freq: Once | INTRAVENOUS | Status: AC
  Filled 2022-05-03: qty 250

## 2022-05-09 NOTE — Progress Notes (Unsigned)
Name: Eric [name]   MRN: [id number]    DOB: [birth year]   Date:05/10/2022       Progress Note  Subjective  Chief Complaint  Follow Up - he came in today with his daughter Eric [name]   HPI  Emphysema:he has been smoking since he was a teenager, he is trying to quit, has not smoked since yesterday. Marland Kitchen He has a smokers cough, usually dry, occasional wheezing and also sob with activity. We gave him Combivent but only prn but does not using it due to cost  .  CKI stage III: discussed avoiding nsaid's.  No symptoms. GFR was  stable between mid 40's to 50's, recently better  He denies pruritis, normal urine output   CAD 3 vessels and aorta atherosclerosis. On CT chest done 06/2018 and repeat in 07/02/2019  He denies chest pain or decrease in exercise tolerance, but not very physically active . He is on Crestor and Zetia last LDL was down from 172 down to 68  . At the time he was on Rosuvastatin but now on Atorvastatin due to cancer treatment but is also on Zetia, we will recheck next visit   Hyperglycemia, hgbA1C went up from 5.0% to 6.1% 6.3 % and last visit was 6.6 % , however , we will recheck it today to confirm diagnosis.  He denies polyphagia, polyuria or polydipsia.   HTN: he has been taking medications daily, bp is towards low end of normal and noticing some lightheadedness when moving around , we will decrease dose of ace and beta blocker by half to keep bp above 120   Prostate Cancer with mets to bones and lymphonodi : under the care of Dr. Rogue [name]. He finished chemo last week, still on oral medication . Weight is stable. He has not filled rx of flomax yet   Dysthymia: feeling better now, he more optimistic now   Eric [name] slowly with a cane, discussed gait instability, risk of falls and referral to PT but he refused   Patient Active Problem List   Diagnosis Date Noted   Genetic testing 03/08/2022   Metastatic cancer to bone ([place]) 03/04/2022   Lower urinary tract symptoms (LUTS)  03/04/2022   Pure hypercholesterolemia 03/04/2022   Dysthymia 03/04/2022   Family history of cancer 02/02/2022   Prostate cancer ([place]) 12/13/2021   Coronary artery calcification seen on CAT scan 06/28/2018   Centrilobular emphysema ([place]) 06/28/2018   Chronic kidney disease, stage III (moderate) ([place]) 06/28/2018   Atherosclerosis of aorta ([place]) 06/19/2018   Chronic low back pain with sciatica 02/07/2018   Hyperglycemia 05/03/2017   Essential hypertension 05/07/2015   Tobacco abuse 05/07/2015    Past Surgical History:  Procedure Laterality Date   COLONOSCOPY     HEMORROIDECTOMY     IR IMAGING GUIDED PORT INSERTION  12/20/2021   PROSTATE BIOPSY N/A 10/26/2021   Procedure: PROSTATE BIOPSY;  Surgeon: Eric Sons, MD;  Location: [facility] ORS;  Service: Urology;  Laterality: N/A;   TRANSRECTAL ULTRASOUND N/A 10/26/2021   Procedure: TRANSRECTAL ULTRASOUND;  Surgeon: Eric Sons, MD;  Location: [facility] ORS;  Service: Urology;  Laterality: N/A;    Family History  Problem Relation Age of Onset   Heart disease Mother    COPD Father    Cancer Brother 23       blood cancer; unk exact type   Cancer Maternal Aunt        unk type   Cancer Paternal Aunt  unk type   Cancer Paternal Uncle        one unk type; one had throat   Cancer Cousin        unk types    Social History   Tobacco Use   Smoking status: Every Day    Packs/day: 0.50    Years: 60.00    Total pack years: 30.00    Types: Cigarettes   Smokeless tobacco: Never   Tobacco comments:    smoking cessation information provided  Substance Use Topics   Alcohol use: Not Currently     Current Outpatient Medications:    acetaminophen (TYLENOL) 500 MG tablet, Take 1 tablet (500 mg total) by mouth every 6 (six) hours as needed., Disp: 30 tablet, Rfl: 0   albuterol (VENTOLIN HFA) 108 (90 Base) MCG/ACT inhaler, TAKE 2 PUFFS BY MOUTH EVERY 6 HOURS AS NEEDED FOR WHEEZE OR SHORTNESS OF BREATH, Disp: 18 each, Rfl: 5    atorvastatin (LIPITOR) 40 MG tablet, Take 1 tablet (40 mg total) by mouth daily. In place of rosuvastatin due to caner treatment, Disp: 90 tablet, Rfl: 1   Ca Carbonate-Mag Hydroxide (ROLAIDS PO), Take 1 tablet by mouth as needed., Disp: , Rfl:    darolutamide (NUBEQA) 300 MG tablet, Take 600 mg by mouth 2 (two) times daily with a meal., Disp: , Rfl:    Fluticasone-Umeclidin-Vilant (TRELEGY ELLIPTA) 100-62.5-25 MCG/ACT AEPB, Inhale 1 puff into the lungs daily., Disp: 3 each, Rfl: 1   lidocaine-prilocaine (EMLA) cream, Apply on the port. 30 -45 min  prior to port access., Disp: 30 g, Rfl: 3   ondansetron (ZOFRAN) 8 MG tablet, One pill every 8 hours as needed for nausea/vomitting., Disp: 40 tablet, Rfl: 1   prochlorperazine (COMPAZINE) 10 MG tablet, Take 1 tablet (10 mg total) by mouth every 6 (six) hours as needed for nausea or vomiting., Disp: 40 tablet, Rfl: 1   tamsulosin (FLOMAX) 0.4 MG CAPS capsule, TAKE 1 CAPSULE BY MOUTH EVERY DAY AFTER SUPPER, Disp: 90 capsule, Rfl: 1   Tdap (ADACEL) 02-22-14.5 LF-MCG/0.5 injection, Inject 0.5 mLs into the muscle once for 1 dose., Disp: 0.5 mL, Rfl: 0   Zoster Vaccine Adjuvanted (SHINGRIX) injection, Inject 0.5 mLs into the muscle once for 1 dose., Disp: 0.5 mL, Rfl: 0   ezetimibe (ZETIA) 10 MG tablet, Take 1 tablet (10 mg total) by mouth every morning., Disp: 90 tablet, Rfl: 1   lisinopril (ZESTRIL) 2.5 MG tablet, Take 1 tablet (2.5 mg total) by mouth daily. For bp and kidney protection, Disp: 90 tablet, Rfl: 1   metoprolol succinate (TOPROL-XL) 25 MG 24 hr tablet, Take 0.5 tablets (12.5 mg total) by mouth daily. Take with or immediately following a meal., Disp: 45 tablet, Rfl: 0  No Known Allergies  I personally reviewed active problem list, medication list, allergies, family history, social history, health maintenance with the patient/caregiver today.   ROS  Constitutional: Negative for fever or weight change.  Respiratory: positive for cough and  shortness of breath.   Cardiovascular: Negative for chest pain or palpitations.  Gastrointestinal: Negative for abdominal pain, no bowel changes.  Musculoskeletal: positive or gait problem but no  joint swelling.  Skin: Negative for rash.  Neurological: Negative for dizziness or headache.  No other specific complaints in a complete review of systems (except as listed in HPI above).   Objective  Vitals:   05/10/22 1038  BP: 112/70  Pulse: 96  Resp: 16  SpO2: 99%  Weight: 145 lb (65.8 kg)  Height: '5\' 6"'$  (1.676 m)    Body mass index is 23.4 kg/m.  Physical Exam  Constitutional: Patient appears well-developed and well-nourished. No distress.  HEENT: head atraumatic, normocephalic, pupils equal and reactive to light, neck supple Cardiovascular: Normal rate, regular rhythm and normal heart sounds.  No murmur heard. No BLE edema. Pulmonary/Chest: Effort normal and breath sounds normal. No respiratory distress. Abdominal: Soft.  There is no tenderness. Psychiatric: Patient has a normal mood and affect. behavior is normal. Judgment and thought content normal.    PHQ2/9:    05/10/2022   10:37 AM 03/04/2022    9:47 AM 02/10/2022    8:24 AM 01/14/2022    1:13 PM 09/30/2021   10:31 AM  Depression screen PHQ 2/9  Decreased Interest 0 3 0 0 0  Down, Depressed, Hopeless 0 1 1 0 0  PHQ - 2 Score 0 4 1 0 0  Altered sleeping 0 0   0  Tired, decreased energy 3 3   0  Change in appetite 0 0   0  Feeling bad or failure about yourself  0 0   0  Trouble concentrating 0 0   0  Moving slowly or fidgety/restless 0 0   0  Suicidal thoughts 0 0   0  PHQ-9 Score 3 7   0    phq 9 is negative   Fall Risk:    05/10/2022   10:37 AM 03/04/2022    9:38 AM 02/10/2022    8:26 AM 09/30/2021   10:31 AM 09/03/2021   10:28 AM  Fall Risk   Falls in the past year? 0 0 0 0 0  Number falls in past yr: 0 0 0 0 0  Injury with Fall? 0 0 0 0 0  Risk for fall due to : No Fall Risks No Fall Risks Impaired  balance/gait No Fall Risks No Fall Risks  Follow up Falls prevention discussed Falls prevention discussed Falls prevention discussed Falls prevention discussed Falls prevention discussed      Functional Status Survey: Is the patient deaf or have difficulty hearing?: Yes Does the patient have difficulty seeing, even when wearing glasses/contacts?: Yes Does the patient have difficulty concentrating, remembering, or making decisions?: No Does the patient have difficulty walking or climbing stairs?: No Does the patient have difficulty dressing or bathing?: No Does the patient have difficulty doing errands alone such as visiting a doctor's office or shopping?: No    Assessment & Plan  1. Prostate cancer ([place])  Finished chemo last week   2. Atherosclerosis of aorta ([facility])  Continue statin therapy   3. Centrilobular emphysema ([facility])  Trying to quit smoking  4. Stage 3a chronic kidney disease ([facility])  - lisinopril (ZESTRIL) 2.5 MG tablet; Take 1 tablet (2.5 mg total) by mouth daily. For bp and kidney protection  Dispense: 90 tablet; Refill: 1  5. Metastatic cancer to bone ([place])   6. Essential hypertension  - lisinopril (ZESTRIL) 2.5 MG tablet; Take 1 tablet (2.5 mg total) by mouth daily. For bp and kidney protection  Dispense: 90 tablet; Refill: 1 - metoprolol succinate (TOPROL-XL) 25 MG 24 hr tablet; Take 0.5 tablets (12.5 mg total) by mouth daily. Take with or immediately following a meal.  Dispense: 45 tablet; Refill: 0  7. Pure hypercholesterolemia  - ezetimibe (ZETIA) 10 MG tablet; Take 1 tablet (10 mg total) by mouth every morning.  Dispense: 90 tablet; Refill: 1  8. Coronary artery calcification seen on CAT scan  -  ezetimibe (ZETIA) 10 MG tablet; Take 1 tablet (10 mg total) by mouth every morning.  Dispense: 90 tablet; Refill: 1 - metoprolol succinate (TOPROL-XL) 25 MG 24 hr tablet; Take 0.5 tablets (12.5 mg total) by mouth daily. Take with or immediately following a meal.   Dispense: 45 tablet; Refill: 0  9. Hyperglycemia  - POCT HgB A1C  10. Need for shingles vaccine  - Zoster Vaccine Adjuvanted [facility]) injection; Inject 0.5 mLs into the muscle once for 1 dose.  Dispense: 0.5 mL; Refill: 0  11. Need for Tdap vaccination  - Tdap (ADACEL) 02-22-14.5 LF-MCG/0.5 injection; Inject 0.5 mLs into the muscle once for 1 dose.  Dispense: 0.5 mL; Refill: 0  12. Dysthymia   13. Lower urinary tract symptoms (LUTS)

## 2022-05-10 ENCOUNTER — Encounter: Payer: Self-pay | Admitting: Family Medicine

## 2022-05-10 ENCOUNTER — Ambulatory Visit (INDEPENDENT_AMBULATORY_CARE_PROVIDER_SITE_OTHER): Payer: Medicare HMO | Admitting: Family Medicine

## 2022-05-10 VITALS — BP 112/70 | HR 96 | Resp 16 | Ht 66.0 in | Wt 145.0 lb

## 2022-05-10 DIAGNOSIS — N1831 Chronic kidney disease, stage 3a: Secondary | ICD-10-CM | POA: Diagnosis not present

## 2022-05-10 DIAGNOSIS — C7951 Secondary malignant neoplasm of bone: Secondary | ICD-10-CM | POA: Diagnosis not present

## 2022-05-10 DIAGNOSIS — C61 Malignant neoplasm of prostate: Secondary | ICD-10-CM

## 2022-05-10 DIAGNOSIS — E7800 Pure hypercholesterolemia, unspecified: Secondary | ICD-10-CM | POA: Diagnosis not present

## 2022-05-10 DIAGNOSIS — J432 Centrilobular emphysema: Secondary | ICD-10-CM | POA: Diagnosis not present

## 2022-05-10 DIAGNOSIS — F341 Dysthymic disorder: Secondary | ICD-10-CM

## 2022-05-10 DIAGNOSIS — I10 Essential (primary) hypertension: Secondary | ICD-10-CM | POA: Diagnosis not present

## 2022-05-10 DIAGNOSIS — I700 Atherosclerosis of aorta: Secondary | ICD-10-CM

## 2022-05-10 DIAGNOSIS — Z23 Encounter for immunization: Secondary | ICD-10-CM

## 2022-05-10 DIAGNOSIS — I2510 Atherosclerotic heart disease of native coronary artery without angina pectoris: Secondary | ICD-10-CM

## 2022-05-10 DIAGNOSIS — Z720 Tobacco use: Secondary | ICD-10-CM

## 2022-05-10 DIAGNOSIS — R739 Hyperglycemia, unspecified: Secondary | ICD-10-CM

## 2022-05-10 DIAGNOSIS — R399 Unspecified symptoms and signs involving the genitourinary system: Secondary | ICD-10-CM

## 2022-05-10 LAB — POCT GLYCOSYLATED HEMOGLOBIN (HGB A1C): Hemoglobin A1C: 5.9 % — AB (ref 4.0–5.6)

## 2022-05-10 MED ORDER — EZETIMIBE 10 MG PO TABS: 10.0000 mg | ORAL_TABLET | ORAL | 1 refills | Status: DC

## 2022-05-10 MED ORDER — TETANUS-DIPHTH-ACELL PERTUSSIS 5-2-15.5 LF-MCG/0.5 IM SUSP: 0.5000 mL | Freq: Once | INTRAMUSCULAR | 0 refills | Status: AC

## 2022-05-10 MED ORDER — METOPROLOL SUCCINATE ER 25 MG PO TB24: 12.5000 mg | ORAL_TABLET | Freq: Every day | ORAL | 0 refills | Status: DC

## 2022-05-10 MED ORDER — SHINGRIX 50 MCG/0.5ML IM SUSR: 0.5000 mL | Freq: Once | INTRAMUSCULAR | 0 refills | Status: AC

## 2022-05-10 MED ORDER — LISINOPRIL 2.5 MG PO TABS: 2.5000 mg | ORAL_TABLET | Freq: Every day | ORAL | 1 refills | Status: DC

## 2022-05-10 NOTE — Assessment & Plan Note (Signed)
Feeling better.

## 2022-05-10 NOTE — Assessment & Plan Note (Signed)
BP is low and he is dizzy, advised to cut bp medications in half and return in 3 months for follow up

## 2022-05-10 NOTE — Assessment & Plan Note (Signed)
A1C today is 5.9 % down from 6.6 % Dec 2022

## 2022-05-10 NOTE — Patient Instructions (Signed)
Please decrease lisinopril from 5 mg to 2.5 mg and  Metoprolol XL 25 mg to 12.5 mg   You can cut both medications in half for now and on your next fill of lisinopril it will the correct dose of 2.5 mg

## 2022-05-10 NOTE — Assessment & Plan Note (Signed)
He has not started flomax yet

## 2022-05-10 NOTE — Assessment & Plan Note (Signed)
Continue statin therapy.

## 2022-05-10 NOTE — Assessment & Plan Note (Signed)
Last cigarette 05/09/2022

## 2022-05-16 ENCOUNTER — Other Ambulatory Visit: Payer: Self-pay

## 2022-05-18 ENCOUNTER — Encounter: Payer: Self-pay | Admitting: Physician Assistant

## 2022-05-18 ENCOUNTER — Ambulatory Visit (INDEPENDENT_AMBULATORY_CARE_PROVIDER_SITE_OTHER): Payer: Medicare HMO | Admitting: Physician Assistant

## 2022-05-18 VITALS — BP 118/69 | HR 86 | Ht 66.0 in | Wt 145.0 lb

## 2022-05-18 DIAGNOSIS — R339 Retention of urine, unspecified: Secondary | ICD-10-CM

## 2022-05-18 LAB — BLADDER SCAN AMB NON-IMAGING: Scan Result: 160

## 2022-05-18 NOTE — Progress Notes (Signed)
05/18/2022 1:27 PM   Eric [name] [birth year] [id number]  CC: Chief Complaint  Patient presents with   Urinary Retention   HPI: Eric [name] is a 81 y.o. male with PMH metastatic prostate cancer on Taxotere chemotherapy and darolutamide per Dr. Rogue Bussing who who recently developed urinary retention possibly associated with constipation who presents today for repeat PVR.   Today he reports no acute concerns.  He has voided without difficulty over the past month.  He denies dysuria or gross hematuria.  PVR 160 mL.  PMH: Past Medical History:  Diagnosis Date   Cancer ([place])    COPD (chronic obstructive pulmonary disease) ([place])    Dyspnea    with exertion   Family history of cancer    GERD (gastroesophageal reflux disease)    occ   Hyperlipidemia    Hypertension     Surgical History: Past Surgical History:  Procedure Laterality Date   COLONOSCOPY     HEMORROIDECTOMY     IR IMAGING GUIDED PORT INSERTION  12/20/2021   PROSTATE BIOPSY N/A 10/26/2021   Procedure: PROSTATE BIOPSY;  Surgeon: Abbie Sons, MD;  Location: [facility] ORS;  Service: Urology;  Laterality: N/A;   TRANSRECTAL ULTRASOUND N/A 10/26/2021   Procedure: TRANSRECTAL ULTRASOUND;  Surgeon: Abbie Sons, MD;  Location: [facility] ORS;  Service: Urology;  Laterality: N/A;    Home Medications:  Allergies as of 05/18/2022   No Known Allergies      Medication List        Accurate as of May 18, 2022  1:27 PM. If you have any questions, ask your nurse or doctor.          acetaminophen 500 MG tablet Commonly known as: TYLENOL Take 1 tablet (500 mg total) by mouth every 6 (six) hours as needed.   albuterol 108 (90 Base) MCG/ACT inhaler Commonly known as: VENTOLIN HFA TAKE 2 PUFFS BY MOUTH EVERY 6 HOURS AS NEEDED FOR WHEEZE OR SHORTNESS OF BREATH   atorvastatin 40 MG tablet Commonly known as: LIPITOR Take 1 tablet (40 mg total) by mouth daily. In place of rosuvastatin due to caner treatment    darolutamide 300 MG tablet Commonly known as: NUBEQA Take 600 mg by mouth 2 (two) times daily with a meal.   ezetimibe 10 MG tablet Commonly known as: ZETIA Take 1 tablet (10 mg total) by mouth every morning.   lidocaine-prilocaine cream Commonly known as: EMLA Apply on the port. 30 -45 min  prior to port access.   lisinopril 2.5 MG tablet Commonly known as: ZESTRIL Take 1 tablet (2.5 mg total) by mouth daily. For bp and kidney protection   metoprolol succinate 25 MG 24 hr tablet Commonly known as: TOPROL-XL Take 0.5 tablets (12.5 mg total) by mouth daily. Take with or immediately following a meal.   ondansetron 8 MG tablet Commonly known as: ZOFRAN One pill every 8 hours as needed for nausea/vomitting.   prochlorperazine 10 MG tablet Commonly known as: COMPAZINE Take 1 tablet (10 mg total) by mouth every 6 (six) hours as needed for nausea or vomiting.   ROLAIDS PO Take 1 tablet by mouth as needed.   tamsulosin 0.4 MG Caps capsule Commonly known as: FLOMAX TAKE 1 CAPSULE BY MOUTH EVERY DAY AFTER SUPPER   Trelegy Ellipta 100-62.5-25 MCG/ACT Aepb Generic drug: Fluticasone-Umeclidin-Vilant Inhale 1 puff into the lungs daily.        Allergies:  No Known Allergies  Family History: Family History  Problem Relation Age of  Onset   Heart disease Mother    COPD Father    Cancer Brother 58       blood cancer; unk exact type   Cancer Maternal Aunt        unk type   Cancer Paternal Aunt        unk type   Cancer Paternal Uncle        one unk type; one had throat   Cancer Cousin        unk types    Social History:   reports that he has been smoking cigarettes. He has a 30.00 pack-year smoking history. He has never used smokeless tobacco. He reports that he does not currently use alcohol. He reports that he does not currently use drugs.  Physical Exam: BP 118/69   Pulse 86   Ht '5\' 6"'$  (1.676 m)   Wt 145 lb (65.8 kg)   BMI 23.40 kg/m   Constitutional:  Alert  and oriented, no acute distress, nontoxic appearing HEENT: [name], AT Cardiovascular: No clubbing, cyanosis, or edema Respiratory: Normal respiratory effort, no increased work of breathing Skin: No rashes, bruises or suspicious lesions Neurologic: Grossly intact, no focal deficits, moving all 4 extremities Psychiatric: Normal mood and affect  Laboratory Data: Results for orders placed or performed in visit on 05/18/22  Bladder Scan (Post Void Residual) in office  Result Value Ref Range   Scan Result 160    Assessment & Plan:   1. Urinary retention PVR improved over prior, though he still has an element of incomplete bladder emptying.  We will continue to monitor, but defer further intervention unless he develops discomfort or recurrent UTIs.  He is in agreement with this plan. - Bladder Scan (Post Void Residual) in office  Return in about 6 months (around 11/18/2022) for Annual prostate cancer follow-up with Dr. Bernardo Heater.  Debroah Loop, PA-C  [facility] [address] [place], [place] [address] [phone]

## 2022-05-19 ENCOUNTER — Other Ambulatory Visit: Payer: Self-pay | Admitting: *Deleted

## 2022-05-19 DIAGNOSIS — C61 Malignant neoplasm of prostate: Secondary | ICD-10-CM

## 2022-05-19 MED ORDER — DAROLUTAMIDE 300 MG PO TABS: 600.0000 mg | ORAL_TABLET | Freq: Two times a day (BID) | ORAL | 2 refills | Status: DC

## 2022-05-19 NOTE — Telephone Encounter (Signed)
CBC with Differential/Platelet Order: [id number] Status: Final result    Visible to patient: No (inaccessible in MyChart)    Next appt: 06/08/2022 at 03:00 PM in Oncology ([facility])    Dx: Prostate cancer ([place])    0 Result Notes           Component Ref Range & Units 2 wk ago (05/03/22) 3 wk ago (04/27/22) 1 mo ago (04/15/22) 1 mo ago (04/10/22) 1 mo ago (04/06/22) 1 mo ago (03/25/22) 2 mo ago (03/16/22)  WBC 4.0 - 10.5 K/uL 8.3  10.7 High   30.2 High   21.1 High   9.8  34.2 High   9.7   RBC 4.22 - 5.81 MIL/uL 3.00 Low   3.00 Low   3.01 Low   3.15 Low   3.02 Low   2.75 Low   3.10 Low    Hemoglobin 13.0 - 17.0 g/dL 10.1 Low   10.0 Low   10.2 Low   10.2 Low   10.1 Low   9.1 Low   10.2 Low    HCT 39.0 - 52.0 % 31.2 Low   31.3 Low   31.2 Low   33.0 Low   31.0 Low   28.2 Low   31.3 Low    MCV 80.0 - 100.0 fL 104.0 High   104.3 High   103.7 High   104.8 High   102.6 High   102.5 High   101.0 High    MCH 26.0 - 34.0 pg 33.7  33.3  33.9  32.4  33.4  33.1  32.9   MCHC 30.0 - 36.0 g/dL 32.4  31.9  32.7  30.9  32.6  32.3  32.6   RDW 11.5 - 15.5 % 15.9 High   16.8 High   16.9 High   17.4 High   17.9 High   18.3 High   17.7 High    Platelets 150 - 400 K/uL 175  335  291  166  373  288  385   nRBC 0.0 - 0.2 % 0.0  0.0  0.2  0.0  0.0  0.4 High   0.0   Neutrophils Relative % % 59  84  78  69  78  79  82   Neutro Abs 1.7 - 7.7 K/uL 4.8  9.1 High   23.5 High   14.8 High   7.8 High   27.0 High   8.0 High    Lymphocytes Relative % '25  11  10  8  15  8  13   '$ Lymphs Abs 0.7 - 4.0 K/uL 2.1  1.2  3.2  1.6  1.4  2.7  1.3   Monocytes Relative % '12  4  5  1  6  5  4   '$ Monocytes Absolute 0.1 - 1.0 K/uL 1.0  0.4  1.5 High   0.2  0.5  1.6 High   0.4   Eosinophils Relative % 0  0  0  0  0  0  0   Eosinophils Absolute 0.0 - 0.5 K/uL 0.0  0.0  0.0  0.0  0.0  0.0  0.0   Basophils Relative % 0  0  1  1  0  0  0   Basophils Absolute 0.0 - 0.1 K/uL 0.0  0.0  0.2 High   0.1  0.0  0.0  0.0   WBC Morphology  DIFF. CONFIRMED  BY SMEAR   11% bands metas and myelos noted diff confirmed  by manual    MODERATE LEFT SHIFT (>5% METAS AND MYELOS,OCC PRO NOTED) CM    RBC Morphology  MORPHOLOGY UNREMARKABLE   MIXED RBC POPULATION  MORPHOLOGY UNREMARKABLE   MORPHOLOGY UNREMARKABLE    Smear Review  Normal platelet morphology   Normal platelet morphology CM  MORPHOLOGY UNREMARKABLE   Normal platelet morphology CM    Comment: PLATELETS APPEAR ADEQUATE  Immature Granulocytes % '4  1  6  21  1  8 '$ CM  1   Abs Immature Granulocytes 0.00 - 0.07 K/uL 0.37 High   0.06 CM  1.88 High  CM  4.37 High  CM  0.05 CM  2.81 High   0.05 CM   Comment: Performed at [facility], [address] [place], [place] [address]  [place]  [place] [facility] [place] [facility] [place] [facility] [place] [facility] [place] [facility] [place] [facility] [place] [facility]         Specimen Collected: 05/03/22 14:13 Last Resulted: 05/03/22 15:41      Lab Flowsheet    Order Details    View Encounter    Lab and Collection Details    Routing    Result History    View All Conversations on this Encounter      CM=Additional comments      Result Care Coordination   Patient Communication   Add Comments   Not seen Back to Top       Other Results from 05/03/2022   Contains abnormal data Basic metabolic panel Order: [id number] Status: Final result    Visible to patient: No (inaccessible in [place])    Next appt: 06/08/2022 at 03:00 PM in Oncology ([facility])    Dx: Prostate cancer ([place])    0 Result Notes           Component Ref Range & Units 2 wk ago (05/03/22) 3 wk ago (04/27/22) 1 mo ago (04/15/22) 1 mo ago (04/10/22) 1 mo ago (04/06/22) 1 mo ago (03/25/22) 2 mo ago (03/16/22)  Sodium 135 - 145 mmol/L 135  135  135  136  138  136  135   Potassium 3.5 - 5.1 mmol/L 3.8  4.5  3.5  4.0  4.2  3.7  4.5   Chloride 98 - 111 mmol/L 105  104  105  106  108  106  107   CO2 22 - 32 mmol/L '23  23  24  22  23  24  21 '$ Low    Glucose, Bld 70 - 99 mg/dL 92   146 High  CM  96 CM  112 High  CM  117 High  CM  114 High  CM  121 High  CM   Comment: Glucose reference range applies only to samples taken after fasting for at least 8 hours.  BUN 8 - 23 mg/dL '12  16  11  22  16  9  20   '$ Creatinine, Ser 0.61 - 1.24 mg/dL 1.05  0.80  1.19  1.14  1.06  1.07  1.09   Calcium 8.9 - 10.3 mg/dL 8.5 Low   8.8 Low   8.5 Low   9.1  8.9  8.3 Low   9.1   GFR, Estimated >60 mL/min >60  >60 CM  >60 CM  >60 CM  >60 CM  >60 CM  >60 CM   Comment: (NOTE)  Calculated using the CKD-EPI  Creatinine Equation (2021)   Anion gap 5 - '15 7  8 '$ CM  6 CM  8 CM  7 CM  6 CM  7 CM   Comment: Performed at [facility], [address] [place], [place] [address]  Resulting Agency  [facility] [place] [facility] [place] [facility] [place] [facility] [place] [facility] [place] [facility] [place] [facility]         Specimen Collected: 05/03/22 14:13 Last Resulted: 05/03/22 14:40

## 2022-05-20 ENCOUNTER — Telehealth: Payer: Self-pay | Admitting: Pharmacist

## 2022-05-20 NOTE — Telephone Encounter (Signed)
I called [employer] to confirm the received the prescription escribed yesterday to [place]. They confirmed that have the prescription.  Called Mr. Friedlander and asked that he call [employer] back to set-up delivery.  He knows to give me a call if he has any trouble with [employer].

## 2022-05-27 ENCOUNTER — Other Ambulatory Visit: Payer: Self-pay | Admitting: Family Medicine

## 2022-05-27 DIAGNOSIS — I2510 Atherosclerotic heart disease of native coronary artery without angina pectoris: Secondary | ICD-10-CM

## 2022-05-27 DIAGNOSIS — E7800 Pure hypercholesterolemia, unspecified: Secondary | ICD-10-CM

## 2022-05-28 ENCOUNTER — Other Ambulatory Visit: Payer: Self-pay | Admitting: Family Medicine

## 2022-05-28 DIAGNOSIS — E7800 Pure hypercholesterolemia, unspecified: Secondary | ICD-10-CM

## 2022-05-28 DIAGNOSIS — I2510 Atherosclerotic heart disease of native coronary artery without angina pectoris: Secondary | ICD-10-CM

## 2022-06-07 MED FILL — Dexamethasone Sodium Phosphate Inj 100 MG/10ML: INTRAMUSCULAR | Qty: 1 | Status: AC

## 2022-06-08 ENCOUNTER — Encounter: Payer: Self-pay | Admitting: Internal Medicine

## 2022-06-08 ENCOUNTER — Inpatient Hospital Stay: Payer: Medicare HMO | Attending: Internal Medicine

## 2022-06-08 ENCOUNTER — Inpatient Hospital Stay (HOSPITAL_BASED_OUTPATIENT_CLINIC_OR_DEPARTMENT_OTHER): Payer: Medicare HMO | Admitting: Internal Medicine

## 2022-06-08 DIAGNOSIS — F17210 Nicotine dependence, cigarettes, uncomplicated: Secondary | ICD-10-CM | POA: Insufficient documentation

## 2022-06-08 DIAGNOSIS — C61 Malignant neoplasm of prostate: Secondary | ICD-10-CM | POA: Diagnosis not present

## 2022-06-08 DIAGNOSIS — C7951 Secondary malignant neoplasm of bone: Secondary | ICD-10-CM | POA: Diagnosis not present

## 2022-06-08 DIAGNOSIS — R634 Abnormal weight loss: Secondary | ICD-10-CM | POA: Insufficient documentation

## 2022-06-08 DIAGNOSIS — I10 Essential (primary) hypertension: Secondary | ICD-10-CM | POA: Insufficient documentation

## 2022-06-08 DIAGNOSIS — R202 Paresthesia of skin: Secondary | ICD-10-CM | POA: Insufficient documentation

## 2022-06-08 DIAGNOSIS — R200 Anesthesia of skin: Secondary | ICD-10-CM | POA: Diagnosis not present

## 2022-06-08 LAB — CBC WITH DIFFERENTIAL/PLATELET
Abs Immature Granulocytes: 0.02 10*3/uL (ref 0.00–0.07)
Basophils Absolute: 0 10*3/uL (ref 0.0–0.1)
Basophils Relative: 0 %
Eosinophils Absolute: 0.1 10*3/uL (ref 0.0–0.5)
Eosinophils Relative: 1 %
HCT: 34.6 % — ABNORMAL LOW (ref 39.0–52.0)
Hemoglobin: 11.1 g/dL — ABNORMAL LOW (ref 13.0–17.0)
Immature Granulocytes: 0 %
Lymphocytes Relative: 28 %
Lymphs Abs: 2.1 10*3/uL (ref 0.7–4.0)
MCH: 32.4 pg (ref 26.0–34.0)
MCHC: 32.1 g/dL (ref 30.0–36.0)
MCV: 100.9 fL — ABNORMAL HIGH (ref 80.0–100.0)
Monocytes Absolute: 0.5 10*3/uL (ref 0.1–1.0)
Monocytes Relative: 6 %
Neutro Abs: 5 10*3/uL (ref 1.7–7.7)
Neutrophils Relative %: 65 %
Platelets: 313 10*3/uL (ref 150–400)
RBC: 3.43 MIL/uL — ABNORMAL LOW (ref 4.22–5.81)
RDW: 14.7 % (ref 11.5–15.5)
WBC: 7.8 10*3/uL (ref 4.0–10.5)
nRBC: 0 % (ref 0.0–0.2)

## 2022-06-08 LAB — COMPREHENSIVE METABOLIC PANEL
ALT: 10 U/L (ref 0–44)
AST: 14 U/L — ABNORMAL LOW (ref 15–41)
Albumin: 3.7 g/dL (ref 3.5–5.0)
Alkaline Phosphatase: 127 U/L — ABNORMAL HIGH (ref 38–126)
Anion gap: 6 (ref 5–15)
BUN: 20 mg/dL (ref 8–23)
CO2: 25 mmol/L (ref 22–32)
Calcium: 8.9 mg/dL (ref 8.9–10.3)
Chloride: 104 mmol/L (ref 98–111)
Creatinine, Ser: 1 mg/dL (ref 0.61–1.24)
GFR, Estimated: >60 mL/min (ref >60)
Glucose, Bld: 106 mg/dL — ABNORMAL HIGH (ref 70–99)
Potassium: 4.1 mmol/L (ref 3.5–5.1)
Sodium: 135 mmol/L (ref 135–145)
Total Bilirubin: 0.7 mg/dL (ref 0.3–1.2)
Total Protein: 6.8 g/dL (ref 6.5–8.1)

## 2022-06-08 LAB — PSA: Prostatic Specific Antigen: 39.51 ng/mL — ABNORMAL HIGH (ref 0.00–4.00)

## 2022-06-08 MED ORDER — GABAPENTIN 100 MG PO CAPS: ORAL_CAPSULE | ORAL | 0 refills | Status: DC

## 2022-06-08 MED ORDER — SODIUM CHLORIDE 0.9% FLUSH
10.0000 mL | Freq: Once | INTRAVENOUS | Status: DC
  Filled 2022-06-08: qty 10

## 2022-06-08 MED ORDER — HEPARIN SOD (PORK) LOCK FLUSH 100 UNIT/ML IV SOLN
500.0000 [IU] | Freq: Once | INTRAVENOUS | Status: AC
  Administered 2022-06-08: 500 [IU] via INTRAVENOUS
  Filled 2022-06-08: qty 5

## 2022-06-08 NOTE — Assessment & Plan Note (Addendum)
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 MRI- liver-1st-March 2023 hemangioma.  Currently s/p cycle # 6 of Taxotere [60 mg per metered square]- finished JULY, 2023.   # Currently on  ADT-Eligard+  darolutamide today; 300 mg- 2 pills BID. Tolerating well.  # PN- from taxotere- G-2-3 monitor for now. Hx of vertigo; start gabapentin low-dose; refer to [facility]; discussed regarding measures to avoid falls.   # Prostatism symptoms: Continue Flomax; STABLE   # Weight loss/malnutrition-evaluation with nutrition-  STABLE   # IV access: functional refilled- emla cream.   # [email or web address]-last July 2023; [prefers 1-2 pm] # DISPOSITION:   # refer to Gwenette Greet- re: peripheral neuropathy/risk of falls.    # follow up in 71month MD; labs/port flush-[facility]/cmp; PSA;  - Dr.B

## 2022-06-08 NOTE — Progress Notes (Signed)
Patient having neuropathy in feet and fingers.    BP 118/73, HR 105

## 2022-06-08 NOTE — Progress Notes (Signed)
[address] [place] OFFICE PROGRESS NOTE  Patient Care Team: Steele Sizer, MD as PCP - General (Family Medicine) Cammie Sickle, MD as Consulting Physician (Oncology) Abbie Sons, MD (Urology)   Cancer Staging  Prostate cancer [facility]) Staging form: Prostate, AJCC 8th Edition - Clinical: Stage IVB (cT2c, cN1, cM1, PSA: 374, Grade Group: 5) - Signed by Cammie Sickle, MD on 12/13/2021 Prostate specific antigen (PSA) range: 20 or greater Histologic grading system: 5 grade system    Oncology History Overview Note  compatible with widespread metastatic prostate cancer, including innumerable sclerotic osseous lesions and extensive lymphadenopathy in the pelvis and retroperitoneum, as detailed above. 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 2. There is also an indeterminate hypovascular lesion in segment 5 of the liver which may represent a metastatic lesion. This could be better characterized with follow-up abdominal MRI with and without IV gadolinium if clinically appropriate. 3. Aortic atherosclerosis with mild fusiform aneurysmal dilatation of the infrarenal abdominal aorta measuring up to 3.2 x 3.0 cm. Recommend follow-up ultrasound every 3 years. This recommendation follows ACR consensus guidelines: White Paper of the ACR Incidental Findings Committee II on Vascular Findings. J Am Coll Radiol 2013; 10:789-794. 4. Bladder wall appears mildly thickened, likely related to bladder outlet obstruction given the enlarged prostate gland. 5. Severe colonic diverticulosis without evidence of acute diverticulitis at this time.  IMPRESSION: There are numerous foci of abnormal tracer uptake in the axial and proximal appendicular skeleton consistent with extensive skeletal metastatic disease.     #Stage IV castrate sensitive prostate cancer-December/20  2023-MRI negative for any liver lesions.; FEB 24th, 2022-Firmagon loading dose;  # march 24th-Taxoeter with udenyca; 4/12- add darolutamide  # DEC 2022- 66 [[place]; Urology]   Prostate cancer ([place])  12/13/2021 Initial Diagnosis   Prostate cancer ([place])   12/13/2021 Cancer Staging   Staging form: Prostate, AJCC 8th Edition - Clinical: Stage IVB (cT2c, cN1, cM1, PSA: 374, Grade Group: 5) - Signed by Cammie Sickle, MD on 12/13/2021 Prostate specific antigen (PSA) range: 20 or greater Histologic grading system: 5 grade system   01/12/2022 - 04/28/2022 Chemotherapy   Patient is on Treatment Plan : PROSTATE Docetaxel + Prednisone q21d      Genetic Testing   Negative genetic testing. No pathogenic variants identified on the [facility] Common Hereditary Cancers +RNA panel. VUS in BRIP1 called c.485G>T identified. The report date is 03/04/2022.  The Common Hereditary Cancers Panel + RNA offered by [facility] includes sequencing and/or deletion duplication testing of the following 47 genes: APC, ATM, AXIN2, BARD1, BMPR1A, BRCA1, BRCA2, BRIP1, CDH1, CDKN2A (p14ARF), CDKN2A (p16INK4a), CKD4, CHEK2, CTNNA1, DICER1, EPCAM (Deletion/duplication testing only), GREM1 (promoter region deletion/duplication testing only), KIT, MEN1, MLH1, MSH2, MSH3, MSH6, MUTYH, NBN, NF1, NHTL1, PALB2, PDGFRA, PMS2, POLD1, POLE, PTEN, RAD50, RAD51C, RAD51D, SDHB, SDHC, SDHD, SMAD4, SMARCA4. STK11, TP53, TSC1, TSC2, and VHL.  The following genes were evaluated for sequence changes only: SDHA and HOXB13 c.251G>A variant only.      HISTORY OF PRESENT ILLNESS: Ambulating with a cane.  With daughter.    Eric [name] 81 y.o.  male pleasant patient above history of castrate sensitive metastatic prostate cancer currently s/p  Taxotere chemotherapy x6 cycles; currently Eligard plus darolutamide is here for follow-up.  Patient complains of worsening tingling and numbness in the extremities.  He has not fallen.  However at times  felt unsteady.  Also has  history of vertigo.    Mild to moderate fatigue.  No nausea no vomiting.  No sores in the mouth.   Review of Systems  Constitutional:  Positive for malaise/fatigue and weight loss. Negative for chills, diaphoresis and fever.  HENT:  Negative for nosebleeds and sore throat.   Eyes:  Negative for double vision.  Respiratory:  Negative for cough, hemoptysis, sputum production, shortness of breath and wheezing.   Cardiovascular:  Negative for chest pain, palpitations, orthopnea and leg swelling.  Gastrointestinal:  Negative for abdominal pain, blood in stool, constipation, diarrhea, heartburn, melena, nausea and vomiting.  Genitourinary:  Negative for dysuria, frequency and urgency.  Musculoskeletal:  Positive for back pain and joint pain.  Skin: Negative.  Negative for itching and rash.  Neurological:  Negative for dizziness, tingling, focal weakness, weakness and headaches.  Endo/Heme/Allergies:  Does not bruise/bleed easily.  Psychiatric/Behavioral:  Negative for depression. The patient is not nervous/anxious and does not have insomnia.       PAST MEDICAL HISTORY :  Past Medical History:  Diagnosis Date   Cancer ([place])    COPD (chronic obstructive pulmonary disease) ([place])    Dyspnea    with exertion   Family history of cancer    GERD (gastroesophageal reflux disease)    occ   Hyperlipidemia    Hypertension     PAST SURGICAL HISTORY :   Past Surgical History:  Procedure Laterality Date   COLONOSCOPY     HEMORROIDECTOMY     IR IMAGING GUIDED PORT INSERTION  12/20/2021   PROSTATE BIOPSY N/A 10/26/2021   Procedure: PROSTATE BIOPSY;  Surgeon: Abbie Sons, MD;  Location: [facility] ORS;  Service: Urology;  Laterality: N/A;   TRANSRECTAL ULTRASOUND N/A 10/26/2021   Procedure: TRANSRECTAL ULTRASOUND;  Surgeon: Abbie Sons, MD;  Location: [facility] ORS;  Service: Urology;  Laterality: N/A;    FAMILY HISTORY :   Family History  Problem Relation Age of Onset    Heart disease Mother    COPD Father    Cancer Brother 19       blood cancer; unk exact type   Cancer Maternal Aunt        unk type   Cancer Paternal Aunt        unk type   Cancer Paternal Uncle        one unk type; one had throat   Cancer Cousin        unk types    SOCIAL HISTORY:   Social History   Tobacco Use   Smoking status: Every Day    Packs/day: 0.50    Years: 60.00    Total pack years: 30.00    Types: Cigarettes   Smokeless tobacco: Never   Tobacco comments:    smoking cessation information provided  Vaping Use   Vaping Use: Never used  Substance Use Topics   Alcohol use: Not Currently   Drug use: Not Currently    ALLERGIES:  has No Known Allergies.  MEDICATIONS:  Current Outpatient Medications  Medication Sig Dispense Refill   acetaminophen (TYLENOL) 500 MG tablet Take 1 tablet (500 mg total) by mouth every 6 (six) hours as needed. 30 tablet 0   albuterol (VENTOLIN HFA) 108 (90 Base) MCG/ACT inhaler TAKE 2 PUFFS BY MOUTH EVERY 6 HOURS AS NEEDED FOR WHEEZE OR SHORTNESS OF BREATH 18 each 5   atorvastatin (LIPITOR) 40 MG tablet Take 1 tablet (40 mg total) by mouth daily. In place of rosuvastatin due to caner treatment  90 tablet 1   Ca Carbonate-Mag Hydroxide (ROLAIDS PO) Take 1 tablet by mouth as needed.     darolutamide (NUBEQA) 300 MG tablet Take 2 tablets (600 mg total) by mouth 2 (two) times daily with a meal. 120 tablet 2   ezetimibe (ZETIA) 10 MG tablet Take 1 tablet (10 mg total) by mouth every morning. 90 tablet 1   Fluticasone-Umeclidin-Vilant (TRELEGY ELLIPTA) 100-62.5-25 MCG/ACT AEPB Inhale 1 puff into the lungs daily. 3 each 1   gabapentin (NEURONTIN) 100 MG capsule Take 1 pill at nighttime for 1 week.  If tolerating well take 2 pills at night. 60 capsule 0   lidocaine-prilocaine (EMLA) cream Apply on the port. 30 -45 min  prior to port access. 30 g 3   lisinopril (ZESTRIL) 2.5 MG tablet Take 1 tablet (2.5 mg total) by mouth daily. For bp and kidney  protection 90 tablet 1   metoprolol succinate (TOPROL-XL) 25 MG 24 hr tablet Take 0.5 tablets (12.5 mg total) by mouth daily. Take with or immediately following a meal. 45 tablet 0   ondansetron (ZOFRAN) 8 MG tablet One pill every 8 hours as needed for nausea/vomitting. 40 tablet 1   prochlorperazine (COMPAZINE) 10 MG tablet Take 1 tablet (10 mg total) by mouth every 6 (six) hours as needed for nausea or vomiting. 40 tablet 1   tamsulosin (FLOMAX) 0.4 MG CAPS capsule TAKE 1 CAPSULE BY MOUTH EVERY DAY AFTER SUPPER 90 capsule 1   No current facility-administered medications for this visit.    PHYSICAL EXAMINATION: ECOG PERFORMANCE STATUS: 1 - Symptomatic but completely ambulatory  BP 118/73 (BP Location: Right Arm, Patient Position: Sitting)   Pulse (!) 105   Temp 98.6 F (37 C) (Tympanic)   Resp 16   Wt 141 lb 12.8 oz (64.3 kg)   SpO2 98%   BMI 22.89 kg/m   Filed Weights   06/08/22 1500  Weight: 141 lb 12.8 oz (64.3 kg)     Physical Exam Vitals and nursing note reviewed.  HENT:     Head: Normocephalic and atraumatic.     Mouth/Throat:     Pharynx: Oropharynx is clear.  Eyes:     Extraocular Movements: Extraocular movements intact.     Pupils: Pupils are equal, round, and reactive to light.  Cardiovascular:     Rate and Rhythm: Normal rate and regular rhythm.  Pulmonary:     Comments: Decreased breath sounds bilaterally.  Abdominal:     Palpations: Abdomen is soft.  Musculoskeletal:        General: Normal range of motion.     Cervical back: Normal range of motion.  Skin:    General: Skin is warm.  Neurological:     General: No focal deficit present.     Mental Status: He is alert and oriented to person, place, and time.  Psychiatric:        Behavior: Behavior normal.        Judgment: Judgment normal.     LABORATORY DATA:  I have reviewed the data as listed    Component Value Date/Time   NA 135 06/08/2022 1451   NA 142 08/17/2015 0928   K 4.1 06/08/2022  1451   CL 104 06/08/2022 1451   CO2 25 06/08/2022 1451   GLUCOSE 106 (H) 06/08/2022 1451   BUN 20 06/08/2022 1451   BUN 17 08/17/2015 0928   CREATININE 1.00 06/08/2022 1451   CREATININE 1.06 09/30/2021 1119   CALCIUM 8.9 06/08/2022 1451   PROT  6.8 06/08/2022 1451   PROT 6.8 08/17/2015 0928   ALBUMIN 3.7 06/08/2022 1451   ALBUMIN 4.1 08/17/2015 0928   AST 14 (L) 06/08/2022 1451   ALT 10 06/08/2022 1451   ALKPHOS 127 (H) 06/08/2022 1451   BILITOT 0.7 06/08/2022 1451   BILITOT 0.4 08/17/2015 0928   GFRNONAA >60 06/08/2022 1451   GFRNONAA 50 (L) 03/03/2021 1050   GFRAA 59 (L) 03/03/2021 1050    No results found for: "SPEP", "UPEP"  Lab Results  Component Value Date   WBC 7.8 06/08/2022   NEUTROABS 5.0 06/08/2022   HGB 11.1 (L) 06/08/2022   HCT 34.6 (L) 06/08/2022   MCV 100.9 (H) 06/08/2022   PLT 313 06/08/2022      Chemistry      Component Value Date/Time   NA 135 06/08/2022 1451   NA 142 08/17/2015 0928   K 4.1 06/08/2022 1451   CL 104 06/08/2022 1451   CO2 25 06/08/2022 1451   BUN 20 06/08/2022 1451   BUN 17 08/17/2015 0928   CREATININE 1.00 06/08/2022 1451   CREATININE 1.06 09/30/2021 1119      Component Value Date/Time   CALCIUM 8.9 06/08/2022 1451   ALKPHOS 127 (H) 06/08/2022 1451   AST 14 (L) 06/08/2022 1451   ALT 10 06/08/2022 1451   BILITOT 0.7 06/08/2022 1451   BILITOT 0.4 08/17/2015 0928       RADIOGRAPHIC STUDIES: I have personally reviewed the radiological images as listed and agreed with the findings in the report. No results found.   ASSESSMENT & PLAN:  Prostate cancer ([place]) #Stage IV-castrate sensitive prostate cancer with multiple bone metastases/extensive lymphadenopathy;  lymphadenopathy in the upper right hemipelvis which likely causes severe stenosis or complete occlusion of the right common iliac vein. MRI- liver-1st-March 2023 hemangioma.  Currently s/p cycle # 6 of Taxotere [60 mg per metered square]- finished JULY, 2023.   #  Currently on  ADT-Eligard+  darolutamide today; 300 mg- 2 pills BID. Tolerating well.  # PN- from taxotere- G-2-3 monitor for now. Hx of vertigo; start gabapentin low-dose; refer to [facility]; discussed regarding measures to avoid falls.   # Prostatism symptoms: Continue Flomax; STABLE   # Weight loss/malnutrition-evaluation with nutrition-  STABLE   # IV access: functional refilled- emla cream.   # [email or web address]-last July 2023; [prefers 1-2 pm] # DISPOSITION:   # refer to Gwenette Greet- re: peripheral neuropathy/risk of falls.    # follow up in 85month MD; labs/port flush-cbc/cmp; PSA;  - Dr.B      Orders Placed This Encounter  Procedures   CBC with Differential/Platelet    Standing Status:   Future    Standing Expiration Date:   06/09/2023   Comprehensive metabolic panel    Standing Status:   Future    Standing Expiration Date:   06/09/2023   PSA    Standing Status:   Future    Standing Expiration Date:   06/09/2023   [facility]    Referral Priority:   Routine    Referral Type:   Consultation    Referral Reason:   Specialty Services Required    Number of Visits Requested:   1   All questions were answered. The patient knows to call the clinic with any problems, questions or concerns.      GCammie Sickle MD 06/08/2022 4:13 PM

## 2022-06-10 ENCOUNTER — Inpatient Hospital Stay: Payer: Medicare HMO

## 2022-06-10 NOTE — Progress Notes (Signed)
Nutrition Follow-up:  Patient with metastatic prostate cancer with bone mets.  Patient s/p 6 cycles of taxotere.  Now on eligard plus darolutamide.   Spoke with patient via phone for nutrition follow-up.  Patient reports that his appetite has improved after finishing chemotherapy.  Says that he ate eggs, cheese and bacon this am for breakfast.  Supper last night was chicken livers, cabbage and beans.  Drinks ensure shakes but not everyday, due to expense.       Medications: reviewed  Labs: reviewed  Anthropometrics:   Weight 141 lb 12.8 oz on 8/16 148 lb 3.2 oz on 6/14 151 lb on 5/24 149 lb on 3/22 151 lb on 1/18   NUTRITION DIAGNOSIS: Inadequate food intake ongoing with weight loss   INTERVENTION:  Recommend drinking 1-2 shakes daily.  Will mail coupons to patient Encouraged patient to increase calories and protein, at least 3 meals per day to prevent additional weight loss    MONITORING, EVALUATION, GOAL: weight trends, intake   NEXT VISIT: Thursday, Sept 21 phone call  Laurelin Elson B. Zenia Resides, [place], [place] Registered Dietitian [phone]

## 2022-06-23 ENCOUNTER — Other Ambulatory Visit (HOSPITAL_COMMUNITY): Payer: Self-pay

## 2022-06-23 NOTE — Progress Notes (Signed)
Name: Eric [name]   MRN: [id number]    DOB: December 03, 1940   Date:06/24/2022       Progress Note  Subjective  Chief Complaint  Follow Up  HPI    HTN: last visit we advised him to cut down metoprolol to half pill and continue lisinopril however he did not cut pills in half and  BP is still towards low end of normal, heart rate was 102 when he arrived but dropped to 88 with rest. We will stop lisinopril today. He states dizziness is not happening as often.    Patient Active Problem List   Diagnosis Date Noted   Genetic testing 03/08/2022   Metastatic cancer to bone ([place]) 03/04/2022   Lower urinary tract symptoms (LUTS) 03/04/2022   Pure hypercholesterolemia 03/04/2022   Dysthymia 03/04/2022   Family history of cancer 02/02/2022   Prostate cancer ([place]) 12/13/2021   Coronary artery calcification seen on CAT scan 06/28/2018   Centrilobular emphysema ([place]) 06/28/2018   Chronic kidney disease, stage III (moderate) (HCC) 06/28/2018   Atherosclerosis of aorta ([place]) 06/19/2018   Chronic low back pain with sciatica 02/07/2018   Hyperglycemia 05/03/2017   Essential hypertension 05/07/2015   Tobacco abuse 05/07/2015    Past Surgical History:  Procedure Laterality Date   COLONOSCOPY     HEMORROIDECTOMY     IR IMAGING GUIDED PORT INSERTION  12/20/2021   PROSTATE BIOPSY N/A 10/26/2021   Procedure: PROSTATE BIOPSY;  Surgeon: Abbie Sons, MD;  Location: [facility] ORS;  Service: Urology;  Laterality: N/A;   TRANSRECTAL ULTRASOUND N/A 10/26/2021   Procedure: TRANSRECTAL ULTRASOUND;  Surgeon: Abbie Sons, MD;  Location: [facility] ORS;  Service: Urology;  Laterality: N/A;    Family History  Problem Relation Age of Onset   Heart disease Mother    COPD Father    Cancer Brother 65       blood cancer; unk exact type   Cancer Maternal Aunt        unk type   Cancer Paternal Aunt        unk type   Cancer Paternal Uncle        one unk type; one had throat   Cancer Cousin        unk types     Social History   Tobacco Use   Smoking status: Every Day    Packs/day: 0.50    Years: 60.00    Total pack years: 30.00    Types: Cigarettes   Smokeless tobacco: Never   Tobacco comments:    smoking cessation information provided  Substance Use Topics   Alcohol use: Not Currently     Current Outpatient Medications:    acetaminophen (TYLENOL) 500 MG tablet, Take 1 tablet (500 mg total) by mouth every 6 (six) hours as needed., Disp: 30 tablet, Rfl: 0   albuterol (VENTOLIN HFA) 108 (90 Base) MCG/ACT inhaler, TAKE 2 PUFFS BY MOUTH EVERY 6 HOURS AS NEEDED FOR WHEEZE OR SHORTNESS OF BREATH, Disp: 18 each, Rfl: 5   atorvastatin (LIPITOR) 40 MG tablet, Take 1 tablet (40 mg total) by mouth daily. In place of rosuvastatin due to caner treatment, Disp: 90 tablet, Rfl: 1   Ca Carbonate-Mag Hydroxide (ROLAIDS PO), Take 1 tablet by mouth as needed., Disp: , Rfl:    darolutamide (NUBEQA) 300 MG tablet, Take 2 tablets (600 mg total) by mouth 2 (two) times daily with a meal., Disp: 120 tablet, Rfl: 2   ezetimibe (ZETIA) 10 MG tablet, Take 1  tablet (10 mg total) by mouth every morning., Disp: 90 tablet, Rfl: 1   Fluticasone-Umeclidin-Vilant (TRELEGY ELLIPTA) 100-62.5-25 MCG/ACT AEPB, Inhale 1 puff into the lungs daily., Disp: 3 each, Rfl: 1   gabapentin (NEURONTIN) 100 MG capsule, Take 1 pill at nighttime for 1 week.  If tolerating well take 2 pills at night., Disp: 60 capsule, Rfl: 0   lidocaine-prilocaine (EMLA) cream, Apply on the port. 30 -45 min  prior to port access., Disp: 30 g, Rfl: 3   ondansetron (ZOFRAN) 8 MG tablet, One pill every 8 hours as needed for nausea/vomitting., Disp: 40 tablet, Rfl: 1   prochlorperazine (COMPAZINE) 10 MG tablet, Take 1 tablet (10 mg total) by mouth every 6 (six) hours as needed for nausea or vomiting., Disp: 40 tablet, Rfl: 1   tamsulosin (FLOMAX) 0.4 MG CAPS capsule, TAKE 1 CAPSULE BY MOUTH EVERY DAY AFTER SUPPER, Disp: 90 capsule, Rfl: 1   metoprolol succinate  (TOPROL-XL) 25 MG 24 hr tablet, Take 0.5 tablets (12.5 mg total) by mouth daily. Take with or immediately following a meal., Disp: 90 tablet, Rfl: 1  No Known Allergies  I personally reviewed active problem list, medication list, allergies, family history, social history, health maintenance with the patient/caregiver today.   ROS  He still feels tired, some numbness on fingers, symptoms of LUTS, some weakness on right hand, no longer having dizziness  Objective  Vitals:   06/24/22 1141 06/24/22 1205  BP: 116/68   Pulse: (!) 102 88  Resp: 16   Temp: 98 F (36.7 C)   TempSrc: Oral   SpO2: 99%   Weight: 144 lb 4.8 oz (65.5 kg)   Height: '5\' 6"'$  (1.676 m)     Body mass index is 23.29 kg/m.  Physical Exam  Constitutional: Patient appears well-developed and malnourished, with some temporal waisting  No distress.  HEENT: head atraumatic, normocephalic, pupils equal and reactive to light,, neck supple Cardiovascular: Normal rate, regular rhythm and normal heart sounds.  No murmur heard. No BLE edema. Pulmonary/Chest: Effort normal and breath sounds normal. No respiratory distress. Abdominal: Soft.  There is no tenderness. Psychiatric: Patient has a normal mood and affect. behavior is normal. Judgment and thought content normal.  Muscular skeletal: walks slowly, using a cane    PHQ2/9:    06/24/2022   11:39 AM 05/10/2022   10:37 AM 03/04/2022    9:47 AM 02/10/2022    8:24 AM 01/14/2022    1:13 PM  Depression screen PHQ 2/9  Decreased Interest 0 0 3 0 0  Down, Depressed, Hopeless 0 0 1 1 0  PHQ - 2 Score 0 0 4 1 0  Altered sleeping 0 0 0    Tired, decreased energy 0 3 3    Change in appetite 0 0 0    Feeling bad or failure about yourself  0 0 0    Trouble concentrating 0 0 0    Moving slowly or fidgety/restless 0 0 0    Suicidal thoughts 0 0 0    PHQ-9 Score 0 3 7    Difficult doing work/chores Not difficult at all        phq 9 is negative   Fall Risk:    06/24/2022    11:39 AM 05/10/2022   10:37 AM 03/04/2022    9:38 AM 02/10/2022    8:26 AM 09/30/2021   10:31 AM  Fall Risk   Falls in the past year? 0 0 0 0 0  Number falls in  past yr: 0 0 0 0 0  Injury with Fall? 0 0 0 0 0  Risk for fall due to : No Fall Risks No Fall Risks No Fall Risks Impaired balance/gait No Fall Risks  Follow up Falls prevention discussed;Education provided Falls prevention discussed Falls prevention discussed Falls prevention discussed Falls prevention discussed      Functional Status Survey: Is the patient deaf or have difficulty hearing?: Yes Does the patient have difficulty seeing, even when wearing glasses/contacts?: Yes Does the patient have difficulty concentrating, remembering, or making decisions?: No Does the patient have difficulty walking or climbing stairs?: No Does the patient have difficulty dressing or bathing?: No Does the patient have difficulty doing errands alone such as visiting a doctor's office or shopping?: No    Assessment & Plan  1. Essential hypertension  - metoprolol succinate (TOPROL-XL) 25 MG 24 hr tablet; Take 0.5 tablets (12.5 mg total) by mouth daily. Take with or immediately following a meal.  Dispense: 90 tablet; Refill: 1  2. Coronary artery calcification seen on CAT scan  - metoprolol succinate (TOPROL-XL) 25 MG 24 hr tablet; Take 0.5 tablets (12.5 mg total) by mouth daily. Take with or immediately following a meal.  Dispense: 90 tablet; Refill: 1  3. Chemotherapy-induced neuropathy (HCC)  Taking gabapentin but states noticing some weakness on right hand since he went up on the dose, discussed NCS but he will hold off until seen by Oncologist

## 2022-06-24 ENCOUNTER — Encounter: Payer: Self-pay | Admitting: Family Medicine

## 2022-06-24 ENCOUNTER — Ambulatory Visit (INDEPENDENT_AMBULATORY_CARE_PROVIDER_SITE_OTHER): Payer: Medicare HMO | Admitting: Family Medicine

## 2022-06-24 VITALS — BP 116/68 | HR 88 | Temp 98.0°F | Resp 16 | Ht 66.0 in | Wt 144.3 lb

## 2022-06-24 DIAGNOSIS — G620 Drug-induced polyneuropathy: Secondary | ICD-10-CM

## 2022-06-24 DIAGNOSIS — I2510 Atherosclerotic heart disease of native coronary artery without angina pectoris: Secondary | ICD-10-CM | POA: Diagnosis not present

## 2022-06-24 DIAGNOSIS — T451X5A Adverse effect of antineoplastic and immunosuppressive drugs, initial encounter: Secondary | ICD-10-CM

## 2022-06-24 DIAGNOSIS — I10 Essential (primary) hypertension: Secondary | ICD-10-CM | POA: Diagnosis not present

## 2022-06-24 MED ORDER — METOPROLOL SUCCINATE ER 25 MG PO TB24: 12.5000 mg | ORAL_TABLET | Freq: Every day | ORAL | 1 refills | Status: DC

## 2022-06-24 MED ORDER — METOPROLOL SUCCINATE ER 25 MG PO TB24
25.0000 mg | ORAL_TABLET | Freq: Every day | ORAL | 1 refills | Status: DC
Start: 2022-06-24 — End: 2023-08-08

## 2022-07-11 ENCOUNTER — Inpatient Hospital Stay: Payer: Medicare HMO | Attending: Internal Medicine

## 2022-07-11 ENCOUNTER — Other Ambulatory Visit: Payer: Medicare HMO

## 2022-07-11 ENCOUNTER — Inpatient Hospital Stay (HOSPITAL_BASED_OUTPATIENT_CLINIC_OR_DEPARTMENT_OTHER): Payer: Medicare HMO | Admitting: Medical Oncology

## 2022-07-11 ENCOUNTER — Encounter: Payer: Self-pay | Admitting: Medical Oncology

## 2022-07-11 ENCOUNTER — Ambulatory Visit: Payer: Medicare HMO | Admitting: Internal Medicine

## 2022-07-11 VITALS — BP 130/72 | HR 101 | Temp 97.2°F | Wt 146.0 lb

## 2022-07-11 DIAGNOSIS — Z95828 Presence of other vascular implants and grafts: Secondary | ICD-10-CM

## 2022-07-11 DIAGNOSIS — R634 Abnormal weight loss: Secondary | ICD-10-CM | POA: Diagnosis not present

## 2022-07-11 DIAGNOSIS — N400 Enlarged prostate without lower urinary tract symptoms: Secondary | ICD-10-CM | POA: Diagnosis not present

## 2022-07-11 DIAGNOSIS — F17210 Nicotine dependence, cigarettes, uncomplicated: Secondary | ICD-10-CM | POA: Diagnosis not present

## 2022-07-11 DIAGNOSIS — R748 Abnormal levels of other serum enzymes: Secondary | ICD-10-CM | POA: Diagnosis not present

## 2022-07-11 DIAGNOSIS — G620 Drug-induced polyneuropathy: Secondary | ICD-10-CM | POA: Insufficient documentation

## 2022-07-11 DIAGNOSIS — C61 Malignant neoplasm of prostate: Secondary | ICD-10-CM | POA: Insufficient documentation

## 2022-07-11 DIAGNOSIS — C7951 Secondary malignant neoplasm of bone: Secondary | ICD-10-CM

## 2022-07-11 DIAGNOSIS — I10 Essential (primary) hypertension: Secondary | ICD-10-CM | POA: Insufficient documentation

## 2022-07-11 LAB — CBC WITH DIFFERENTIAL/PLATELET
Abs Immature Granulocytes: 0.02 10*3/uL (ref 0.00–0.07)
Basophils Absolute: 0 10*3/uL (ref 0.0–0.1)
Basophils Relative: 0 %
Eosinophils Absolute: 0.1 10*3/uL (ref 0.0–0.5)
Eosinophils Relative: 1 %
HCT: 35.5 % — ABNORMAL LOW (ref 39.0–52.0)
Hemoglobin: 11.5 g/dL — ABNORMAL LOW (ref 13.0–17.0)
Immature Granulocytes: 0 %
Lymphocytes Relative: 28 %
Lymphs Abs: 2.1 10*3/uL (ref 0.7–4.0)
MCH: 32.1 pg (ref 26.0–34.0)
MCHC: 32.4 g/dL (ref 30.0–36.0)
MCV: 99.2 fL (ref 80.0–100.0)
Monocytes Absolute: 0.5 10*3/uL (ref 0.1–1.0)
Monocytes Relative: 6 %
Neutro Abs: 4.7 10*3/uL (ref 1.7–7.7)
Neutrophils Relative %: 65 %
Platelets: 272 10*3/uL (ref 150–400)
RBC: 3.58 MIL/uL — ABNORMAL LOW (ref 4.22–5.81)
RDW: 14.3 % (ref 11.5–15.5)
WBC: 7.4 10*3/uL (ref 4.0–10.5)
nRBC: 0 % (ref 0.0–0.2)

## 2022-07-11 LAB — COMPREHENSIVE METABOLIC PANEL
ALT: 9 U/L (ref 0–44)
AST: 13 U/L — ABNORMAL LOW (ref 15–41)
Albumin: 3.7 g/dL (ref 3.5–5.0)
Alkaline Phosphatase: 178 U/L — ABNORMAL HIGH (ref 38–126)
Anion gap: 5 (ref 5–15)
BUN: 23 mg/dL (ref 8–23)
CO2: 26 mmol/L (ref 22–32)
Calcium: 8.8 mg/dL — ABNORMAL LOW (ref 8.9–10.3)
Chloride: 106 mmol/L (ref 98–111)
Creatinine, Ser: 1.21 mg/dL (ref 0.61–1.24)
GFR, Estimated: >60 mL/min (ref >60)
Glucose, Bld: 120 mg/dL — ABNORMAL HIGH (ref 70–99)
Potassium: 4.2 mmol/L (ref 3.5–5.1)
Sodium: 137 mmol/L (ref 135–145)
Total Bilirubin: 0.6 mg/dL (ref 0.3–1.2)
Total Protein: 6.9 g/dL (ref 6.5–8.1)

## 2022-07-11 LAB — PSA: Prostatic Specific Antigen: 94.13 ng/mL — ABNORMAL HIGH (ref 0.00–4.00)

## 2022-07-11 MED ORDER — SODIUM CHLORIDE 0.9% FLUSH
10.0000 mL | Freq: Once | INTRAVENOUS | Status: AC
  Administered 2022-07-11: 10 mL via INTRAVENOUS
  Filled 2022-07-11: qty 10

## 2022-07-11 MED ORDER — HEPARIN SOD (PORK) LOCK FLUSH 100 UNIT/ML IV SOLN
500.0000 [IU] | Freq: Once | INTRAVENOUS | Status: AC
  Administered 2022-07-11: 500 [IU] via INTRAVENOUS
  Filled 2022-07-11: qty 5

## 2022-07-11 NOTE — Progress Notes (Addendum)
[address] [facility] OFFICE PROGRESS NOTE  Patient Care Team: Eric Sizer, MD as PCP - General (Family Medicine) Eric Sickle, MD as Consulting Physician (Oncology) Eric Sons, MD (Urology)   Cancer Staging  Prostate cancer [facility]) Staging form: Prostate, AJCC 8th Edition - Clinical: Stage IVB (cT2c, cN1, cM1, PSA: 374, Grade Group: 5) - Signed by Eric Sickle, MD on 12/13/2021 Prostate specific antigen (PSA) range: 20 or greater Histologic grading system: 5 grade system    Oncology History Overview Note  compatible with widespread metastatic prostate cancer, including innumerable sclerotic osseous lesions and extensive lymphadenopathy in the pelvis and retroperitoneum, as detailed above. 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 2. There is also an indeterminate hypovascular lesion in segment 5 of the liver which may represent a metastatic lesion. This could be better characterized with follow-up abdominal MRI with and without IV gadolinium if clinically appropriate. 3. Aortic atherosclerosis with mild fusiform aneurysmal dilatation of the infrarenal abdominal aorta measuring up to 3.2 x 3.0 cm. Recommend follow-up ultrasound every 3 years. This recommendation follows ACR consensus guidelines: White Paper of the ACR Incidental Findings Committee II on Vascular Findings. J Am Coll Radiol 2013; 10:789-794. 4. Bladder wall appears mildly thickened, likely related to bladder outlet obstruction given the enlarged prostate gland. 5. Severe colonic diverticulosis without evidence of acute diverticulitis at this time.  IMPRESSION: There are numerous foci of abnormal tracer uptake in the axial and proximal appendicular skeleton consistent with extensive skeletal metastatic disease.     #Stage IV castrate sensitive prostate cancer-December/20  2023-MRI negative for any liver lesions.; FEB 24th, 2022-Firmagon loading dose;  # march 24th-Taxoeter with udenyca; 4/12- add darolutamide  # DEC 2022- 62 [Eric [name]; Urology]   Prostate cancer (Eric [name])  12/13/2021 Initial Diagnosis   Prostate cancer (Eric [name])   12/13/2021 Cancer Staging   Staging form: Prostate, AJCC 8th Edition - Clinical: Stage IVB (cT2c, cN1, cM1, PSA: 374, Grade Group: 5) - Signed by Eric Sickle, MD on 12/13/2021 Prostate specific antigen (PSA) range: 20 or greater Histologic grading system: 5 grade system   01/12/2022 - 04/28/2022 Chemotherapy   Patient is on Treatment Plan : PROSTATE Docetaxel + Prednisone q21d      Genetic Testing   Negative genetic testing. No pathogenic variants identified on the [facility] Common Hereditary Cancers +RNA panel. VUS in BRIP1 called c.485G>T identified. The report date is 03/04/2022.  The Common Hereditary Cancers Panel + RNA offered by [facility] includes sequencing and/or deletion duplication testing of the following 47 genes: APC, ATM, AXIN2, BARD1, BMPR1A, BRCA1, BRCA2, BRIP1, CDH1, CDKN2A (p14ARF), CDKN2A (p16INK4a), CKD4, CHEK2, CTNNA1, DICER1, EPCAM (Deletion/duplication testing only), GREM1 (promoter region deletion/duplication testing only), KIT, MEN1, MLH1, MSH2, MSH3, MSH6, MUTYH, NBN, NF1, NHTL1, PALB2, PDGFRA, PMS2, POLD1, POLE, PTEN, RAD50, RAD51C, RAD51D, SDHB, SDHC, SDHD, SMAD4, SMARCA4. STK11, TP53, TSC1, TSC2, and VHL.  The following genes were evaluated for sequence changes only: SDHA and HOXB13 c.251G>A variant only.      HISTORY OF PRESENT ILLNESS: Ambulating with a cane.  With daughter.    Eric [name] 81 y.o.  male pleasant patient above history of castrate sensitive metastatic prostate cancer currently s/p  Taxotere chemotherapy x6 cycles; currently Eligard plus darolutamide is here for follow-up.  Patient is here with his daughter today. They report that he is doing really well- much better than his last  visit. He tried the  gabapentin given to him by Dr. B at his last visit. The 100 mg dose worked well but 200 mg caused hand weakness so he stopped all of it. Now his neuropathy is almost fully resolved without any medications- think it is from him being further out from chemotherapy. He has no complaints today: no fevers, night sweats, unintentional weight loss (has gained weight from improved appetite), no new or worsening bone pains.   He has his first visit with Eric [name] in OT on Wednesday.   Wt Readings from Last 3 Encounters:  07/11/22 146 lb (66.2 kg)  06/24/22 144 lb 4.8 oz (65.5 kg)  06/08/22 141 lb 12.8 oz (64.3 kg)     Review of Systems  Constitutional:  Negative for chills, diaphoresis, fever, malaise/fatigue and weight loss.  HENT:  Negative for nosebleeds and sore throat.   Eyes:  Negative for double vision.  Respiratory:  Negative for cough, hemoptysis, sputum production, shortness of breath and wheezing.   Cardiovascular:  Negative for chest pain, palpitations, orthopnea and leg swelling.  Gastrointestinal:  Negative for abdominal pain, blood in stool, constipation, diarrhea, heartburn, melena, nausea and vomiting.  Genitourinary:  Negative for dysuria, frequency and urgency.  Musculoskeletal:  Positive for back pain and joint pain.  Skin: Negative.  Negative for itching and rash.  Neurological:  Negative for dizziness, tingling, focal weakness, weakness and headaches.  Endo/Heme/Allergies:  Does not bruise/bleed easily.  Psychiatric/Behavioral:  Negative for depression. The patient is not nervous/anxious and does not have insomnia.       PAST MEDICAL HISTORY :  Past Medical History:  Diagnosis Date   Cancer ([place])    COPD (chronic obstructive pulmonary disease) ([place])    Dyspnea    with exertion   Family history of cancer    GERD (gastroesophageal reflux disease)    occ   Hyperlipidemia    Hypertension     PAST SURGICAL HISTORY :   Past Surgical History:   Procedure Laterality Date   COLONOSCOPY     HEMORROIDECTOMY     IR IMAGING GUIDED PORT INSERTION  12/20/2021   PROSTATE BIOPSY N/A 10/26/2021   Procedure: PROSTATE BIOPSY;  Surgeon: Eric Sons, MD;  Location: [facility] ORS;  Service: Urology;  Laterality: N/A;   TRANSRECTAL ULTRASOUND N/A 10/26/2021   Procedure: TRANSRECTAL ULTRASOUND;  Surgeon: Eric Sons, MD;  Location: [facility] ORS;  Service: Urology;  Laterality: N/A;    FAMILY HISTORY :   Family History  Problem Relation Age of Onset   Heart disease Mother    COPD Father    Cancer Brother 61       blood cancer; unk exact type   Cancer Maternal Aunt        unk type   Cancer Paternal Aunt        unk type   Cancer Paternal Uncle        one unk type; one had throat   Cancer Cousin        unk types    SOCIAL HISTORY:   Social History   Tobacco Use   Smoking status: Every Day    Packs/day: 0.50    Years: 60.00    Total pack years: 30.00    Types: Cigarettes   Smokeless tobacco: Never   Tobacco comments:    smoking cessation information provided  Vaping Use   Vaping Use: Never used  Substance Use Topics   Alcohol use: Not Currently   Drug use: Not Currently  ALLERGIES:  has No Known Allergies.  MEDICATIONS:  Current Outpatient Medications  Medication Sig Dispense Refill   acetaminophen (TYLENOL) 500 MG tablet Take 1 tablet (500 mg total) by mouth every 6 (six) hours as needed. 30 tablet 0   albuterol (VENTOLIN HFA) 108 (90 Base) MCG/ACT inhaler TAKE 2 PUFFS BY MOUTH EVERY 6 HOURS AS NEEDED FOR WHEEZE OR SHORTNESS OF BREATH 18 each 5   atorvastatin (LIPITOR) 40 MG tablet Take 1 tablet (40 mg total) by mouth daily. In place of rosuvastatin due to caner treatment 90 tablet 1   Ca Carbonate-Mag Hydroxide (ROLAIDS PO) Take 1 tablet by mouth as needed.     darolutamide (NUBEQA) 300 MG tablet Take 2 tablets (600 mg total) by mouth 2 (two) times daily with a meal. 120 tablet 2   ezetimibe (ZETIA) 10 MG tablet Take 1  tablet (10 mg total) by mouth every morning. 90 tablet 1   Fluticasone-Umeclidin-Vilant (TRELEGY ELLIPTA) 100-62.5-25 MCG/ACT AEPB Inhale 1 puff into the lungs daily. 3 each 1   gabapentin (NEURONTIN) 100 MG capsule Take 1 pill at nighttime for 1 week.  If tolerating well take 2 pills at night. 60 capsule 0   lidocaine-prilocaine (EMLA) cream Apply on the port. 30 -45 min  prior to port access. 30 g 3   metoprolol succinate (TOPROL-XL) 25 MG 24 hr tablet Take 1 tablet (25 mg total) by mouth daily. Take with or immediately following a meal. 90 tablet 1   ondansetron (ZOFRAN) 8 MG tablet One pill every 8 hours as needed for nausea/vomitting. 40 tablet 1   prochlorperazine (COMPAZINE) 10 MG tablet Take 1 tablet (10 mg total) by mouth every 6 (six) hours as needed for nausea or vomiting. 40 tablet 1   tamsulosin (FLOMAX) 0.4 MG CAPS capsule TAKE 1 CAPSULE BY MOUTH EVERY DAY AFTER SUPPER 90 capsule 1   No current facility-administered medications for this visit.    PHYSICAL EXAMINATION: ECOG PERFORMANCE STATUS: 1 - Symptomatic but completely ambulatory  BP 130/72 (BP Location: Left Arm, Patient Position: Sitting)   Pulse (!) 101   Temp (!) 97.2 F (36.2 C) (Tympanic)   Wt 146 lb (66.2 kg)   BMI 23.57 kg/m   Filed Weights   07/11/22 1308  Weight: 146 lb (66.2 kg)     Physical Exam Vitals and nursing note reviewed.  Constitutional:      Comments: Using cane gently   HENT:     Head: Normocephalic and atraumatic.     Mouth/Throat:     Pharynx: Oropharynx is clear.  Eyes:     Extraocular Movements: Extraocular movements intact.     Pupils: Pupils are equal, round, and reactive to light.  Cardiovascular:     Rate and Rhythm: Normal rate and regular rhythm.  Pulmonary:     Comments: Decreased breath sounds bilaterally.  Abdominal:     Palpations: Abdomen is soft.     Tenderness: Right CVA tenderness: .dx.  Musculoskeletal:        General: Normal range of motion.     Cervical  back: Normal range of motion.  Skin:    General: Skin is warm.  Neurological:     General: No focal deficit present.     Mental Status: He is alert and oriented to person, place, and time.  Psychiatric:        Behavior: Behavior normal.        Judgment: Judgment normal.     LABORATORY DATA:  I have reviewed  the data as listed    Component Value Date/Time   NA 137 07/11/2022 1254   NA 142 08/17/2015 0928   K 4.2 07/11/2022 1254   CL 106 07/11/2022 1254   CO2 26 07/11/2022 1254   GLUCOSE 120 (H) 07/11/2022 1254   BUN 23 07/11/2022 1254   BUN 17 08/17/2015 0928   CREATININE 1.21 07/11/2022 1254   CREATININE 1.06 09/30/2021 1119   CALCIUM 8.8 (L) 07/11/2022 1254   PROT 6.9 07/11/2022 1254   PROT 6.8 08/17/2015 0928   ALBUMIN 3.7 07/11/2022 1254   ALBUMIN 4.1 08/17/2015 0928   AST 13 (L) 07/11/2022 1254   ALT 9 07/11/2022 1254   ALKPHOS 178 (H) 07/11/2022 1254   BILITOT 0.6 07/11/2022 1254   BILITOT 0.4 08/17/2015 0928   GFRNONAA >60 07/11/2022 1254   GFRNONAA 50 (L) 03/03/2021 1050   GFRAA 59 (L) 03/03/2021 1050    No results found for: "SPEP", "UPEP"  Lab Results  Component Value Date   WBC 7.4 07/11/2022   NEUTROABS 4.7 07/11/2022   HGB 11.5 (L) 07/11/2022   HCT 35.5 (L) 07/11/2022   MCV 99.2 07/11/2022   PLT 272 07/11/2022      Chemistry      Component Value Date/Time   NA 137 07/11/2022 1254   NA 142 08/17/2015 0928   K 4.2 07/11/2022 1254   CL 106 07/11/2022 1254   CO2 26 07/11/2022 1254   BUN 23 07/11/2022 1254   BUN 17 08/17/2015 0928   CREATININE 1.21 07/11/2022 1254   CREATININE 1.06 09/30/2021 1119      Component Value Date/Time   CALCIUM 8.8 (L) 07/11/2022 1254   ALKPHOS 178 (H) 07/11/2022 1254   AST 13 (L) 07/11/2022 1254   ALT 9 07/11/2022 1254   BILITOT 0.6 07/11/2022 1254   BILITOT 0.4 08/17/2015 2774       RADIOGRAPHIC STUDIES: I have personally reviewed the radiological images as listed and agreed with the findings in the  report. No results found.   ASSESSMENT & PLAN:  Encounter Diagnoses  Name Primary?   Prostate cancer ([place]) Yes   Metastatic cancer to bone ([place])    Drug-induced polyneuropathy ([place])    Port-A-Cath in place    Weight loss    Alkaline phosphatase elevation     Prostate cancer ([place]) #Stage IV-castrate sensitive prostate cancer with multiple bone metastases/extensive lymphadenopathy;  lymphadenopathy in the upper right hemipelvis which likely causes severe stenosis or complete occlusion of the right common iliac vein. MRI- liver-1st-March 2023 hemangioma.  Currently s/p cycle # 6 of Taxotere [60 mg per metered square]- finished JULY, 2023.    # Currently on  ADT-Eligard+  darolutamide; 300 mg- 2 pills BID. Tolerating well. Continue    # PN- from taxotere: Significant resolution in symptoms. Off of gabapentin now- may consider 100 mg in the future if needed.    # Prostatism symptoms: Chronic and stable on Flomax   # Weight loss/malnutrition-evaluation with nutrition- Greatly improved. Will continue to monitor    # IV access: port flush completed today   # [id number]-last July 2023; [prefers 1-2 pm] Due for this in Jan 2024  # Elevated Alkaline Phosphatase level- mild elevation today. Waxes and wanes. Will monitor   # DISPOSITION:    # follow up in 56month MD; labs/port flush-cbc/cmp; PSA- [place]   No orders of the defined types were placed in this encounter.  All questions were answered. The patient knows to call the clinic with any  problems, questions or concerns.      Hughie Closs, PA-C 07/11/2022 3:37 PM

## 2022-07-13 ENCOUNTER — Inpatient Hospital Stay: Payer: Medicare HMO | Admitting: Occupational Therapy

## 2022-07-13 DIAGNOSIS — G620 Drug-induced polyneuropathy: Secondary | ICD-10-CM

## 2022-07-13 NOTE — Therapy (Signed)
[facility] at [facility], [place], [place], [address] Phone: [phone]   Fax:  [phone]  Occupational Therapy Screen  Patient Details  Name: Eric [name] MRN: [id number] Date of Birth: 06-Dec-1940 No data recorded  Encounter Date: 07/13/2022   OT End of Session - 07/13/22 1610     Visit Number 0             Past Medical History:  Diagnosis Date   Cancer ([place])    COPD (chronic obstructive pulmonary disease) ([place])    Dyspnea    with exertion   Family history of cancer    GERD (gastroesophageal reflux disease)    occ   Hyperlipidemia    Hypertension     Past Surgical History:  Procedure Laterality Date   COLONOSCOPY     HEMORROIDECTOMY     IR IMAGING GUIDED PORT INSERTION  12/20/2021   PROSTATE BIOPSY N/A 10/26/2021   Procedure: PROSTATE BIOPSY;  Surgeon: Abbie Sons, MD;  Location: [facility] ORS;  Service: Urology;  Laterality: N/A;   TRANSRECTAL ULTRASOUND N/A 10/26/2021   Procedure: TRANSRECTAL ULTRASOUND;  Surgeon: Abbie Sons, MD;  Location: [facility] ORS;  Service: Urology;  Laterality: N/A;    There were no vitals filed for this visit.   Subjective Assessment - 07/13/22 1610     Subjective  I know I need to be active more.  I am done with my chemo.  But I am very sedentary and watching cowboy movies most of the day.    Currently in Pain? No/denies                Office visit with NP Nelwyn Salisbury 07/11/22: : Ambulating with a cane.  With daughter.    Eric [name] 81 y.o.  male pleasant patient above history of castrate sensitive metastatic prostate cancer currently s/p  Taxotere chemotherapy x6 cycles; currently Eligard plus darolutamide is here for follow-up.   Patient is here with his daughter today. They report that he is doing really well- much better than his last visit. He tried the gabapentin given to him by Dr. B at his last visit. The 100 mg dose worked well but 200  mg caused hand weakness so he stopped all of it. Now his neuropathy is almost fully resolved without any medications- think it is from him being further out from chemotherapy. He has no complaints today: no fevers, night sweats, unintentional weight loss (has gained weight from improved appetite), no new or worsening bone pains.    He has his first visit with Gwenette Greet in [place] on 07/13/22   OT SCREEN 07/13/22   Patient presented OT screen with daughter ambulating with a single-point cane..  Patient denied any falls in the last 3 to 6 months.  Patient has single-story house with a tub shower combo.  Patient would like to take a tub bath.  And is able still to get out of the tub using the railings.  Recommended patient to get nonslip early night or patches.  As well as a tub shower bench and hand-held shower. Patient with neuropathy in bilateral feet and fingertips.  Patient do report neuropathy got better is mostly in his heels elevated and in the toes.  Patient is looking into getting some innersoles for his feet. Patient admits that he is very sedentary and sitting watching TV most of the time.  Eating meals also on the couch or recliner. Patient is interested in  getting more active as well as with his encouragement of his daughter. BERG balance test done with pt -score of 47 out of 56 putting him at a low risk for falling.  But discussed with patient and daughter that wants to make sure patient maintain his strength and mobility. Patient was able to easily do sit and stand without using hands.  Most difficult was standing 1 leg as well as standing heel-to-toe and with turning 360 degrees he did get a little dizzy.  He has a history of vertigo and dizziness. Discussed with patient home program and patient very motivated to try at home. HEP - 150 min week OR 2 x 22-24 min day OR 4 x 5-7 min day  -Sit <> stand not using hands from firm chair 10 reps -Sidestepping in the hallway or kitchen counter or recliner  1 to 2 minutes -Standing at kitchen counter or recliner heel raises 10 reps -Standing holding on doing ankle dorsiflexion plantarflexion right and left leg 10 reps -Standing at recliner kitchen counter holding on doing for each leg hip flexion, abduction and extension right and left leg 10 reps each tapping at 12, 3 and 6 o'oclock. Per daughter patient can walk outside of the house around to get the mail as well as to some of the neighbors and in the yard..  Encourage patient to pay attention to uneven surfaces and increase gradually his walking distance or time.  Can follow-up with me if needed in 2 to 3 weeks to reassess balance.                            Patient will benefit from skilled therapeutic intervention in order to improve the following deficits and impairments:           Visit Diagnosis: Drug-induced polyneuropathy [facility])    Problem List Patient Active Problem List   Diagnosis Date Noted   Genetic testing 03/08/2022   Metastatic cancer to bone ([place]) 03/04/2022   Lower urinary tract symptoms (LUTS) 03/04/2022   Pure hypercholesterolemia 03/04/2022   Dysthymia 03/04/2022   Family history of cancer 02/02/2022   Prostate cancer ([place]) 12/13/2021   Coronary artery calcification seen on CAT scan 06/28/2018   Centrilobular emphysema ([place]) 06/28/2018   Chronic kidney disease, stage III (moderate) ([place]) 06/28/2018   Atherosclerosis of aorta ([place]) 06/19/2018   Chronic low back pain with sciatica 02/07/2018   Hyperglycemia 05/03/2017   Essential hypertension 05/07/2015   Tobacco abuse 05/07/2015    Rosalyn Gess, OTR/L,CLT 07/13/2022, 4:11 PM  [place] at [facility] [address] [place], [place], [address] Phone: [phone]   Fax:  [phone]  Name: Eric [name] MRN: [id number] Date of Birth: 18-Jan-1941

## 2022-07-21 ENCOUNTER — Inpatient Hospital Stay: Payer: Medicare HMO

## 2022-07-21 NOTE — Progress Notes (Signed)
Nutrition Follow-up:  Patient with metastatic prostate cancer with bone mets.  Patient s/p 6 cycles of taxotere, currently on eligard plus darolutamide.    Spoke with patient via phone for nutrition follow-up.  Patient reports that his appetite is better.  He ate shrimp and rice with ensure for lunch today (from local restaurant) and oatmeal for breakfast this morning and water.  Last night for dinner had shrimp, rice and egg roll as well.  Denies nutrition impact symptoms at this time.  Has worked with OT and has been giving exercises.    Medications: reviewed  Labs: reviewed  Anthropometrics:   Weight 146 lb on 9/18 141 lb on 8/16 148 lb on 6/14 151 lb on 5/24 149 lb on 3/22 151 lb on 1/18   NUTRITION DIAGNOSIS: Inadequate food intake improved   INTERVENTION:  Continue ensure 1-3 times per day. Will mail coupons Encouraged patient to continue focusing on lean proteins and plant foods.   Encouraged patient to continue exercises given by OT to improve strength.   Contact information mailed and patient will contact RD if needed   NEXT VISIT: no follow-up RD available if needed  Leyla Soliz B. Zenia Resides, [place], [place] Registered Dietitian [phone]

## 2022-08-03 ENCOUNTER — Inpatient Hospital Stay: Payer: Medicare HMO

## 2022-08-09 ENCOUNTER — Other Ambulatory Visit: Payer: Self-pay | Admitting: *Deleted

## 2022-08-09 DIAGNOSIS — C61 Malignant neoplasm of prostate: Secondary | ICD-10-CM

## 2022-08-10 ENCOUNTER — Encounter: Payer: Self-pay | Admitting: Internal Medicine

## 2022-08-10 ENCOUNTER — Inpatient Hospital Stay (HOSPITAL_BASED_OUTPATIENT_CLINIC_OR_DEPARTMENT_OTHER): Payer: Medicare HMO | Admitting: Internal Medicine

## 2022-08-10 ENCOUNTER — Inpatient Hospital Stay: Payer: Medicare HMO | Attending: Internal Medicine

## 2022-08-10 VITALS — BP 125/77 | HR 92 | Temp 97.7°F | Resp 16 | Wt 150.0 lb

## 2022-08-10 DIAGNOSIS — G629 Polyneuropathy, unspecified: Secondary | ICD-10-CM | POA: Diagnosis not present

## 2022-08-10 DIAGNOSIS — C61 Malignant neoplasm of prostate: Secondary | ICD-10-CM | POA: Insufficient documentation

## 2022-08-10 DIAGNOSIS — R29898 Other symptoms and signs involving the musculoskeletal system: Secondary | ICD-10-CM

## 2022-08-10 DIAGNOSIS — I10 Essential (primary) hypertension: Secondary | ICD-10-CM | POA: Diagnosis not present

## 2022-08-10 DIAGNOSIS — C7951 Secondary malignant neoplasm of bone: Secondary | ICD-10-CM | POA: Insufficient documentation

## 2022-08-10 DIAGNOSIS — N400 Enlarged prostate without lower urinary tract symptoms: Secondary | ICD-10-CM | POA: Diagnosis not present

## 2022-08-10 DIAGNOSIS — F17210 Nicotine dependence, cigarettes, uncomplicated: Secondary | ICD-10-CM | POA: Insufficient documentation

## 2022-08-10 DIAGNOSIS — R634 Abnormal weight loss: Secondary | ICD-10-CM | POA: Insufficient documentation

## 2022-08-10 DIAGNOSIS — Z95828 Presence of other vascular implants and grafts: Secondary | ICD-10-CM

## 2022-08-10 LAB — COMPREHENSIVE METABOLIC PANEL
ALT: 9 U/L (ref 0–44)
AST: 15 U/L (ref 15–41)
Albumin: 3.5 g/dL (ref 3.5–5.0)
Alkaline Phosphatase: 297 U/L — ABNORMAL HIGH (ref 38–126)
Anion gap: 6 (ref 5–15)
BUN: 25 mg/dL — ABNORMAL HIGH (ref 8–23)
CO2: 25 mmol/L (ref 22–32)
Calcium: 8.8 mg/dL — ABNORMAL LOW (ref 8.9–10.3)
Chloride: 106 mmol/L (ref 98–111)
Creatinine, Ser: 1.07 mg/dL (ref 0.61–1.24)
GFR, Estimated: >60 mL/min (ref >60)
Glucose, Bld: 98 mg/dL (ref 70–99)
Potassium: 4.2 mmol/L (ref 3.5–5.1)
Sodium: 137 mmol/L (ref 135–145)
Total Bilirubin: 0.4 mg/dL (ref 0.3–1.2)
Total Protein: 6.8 g/dL (ref 6.5–8.1)

## 2022-08-10 LAB — CBC WITH DIFFERENTIAL/PLATELET
Abs Immature Granulocytes: 0.03 10*3/uL (ref 0.00–0.07)
Basophils Absolute: 0 10*3/uL (ref 0.0–0.1)
Basophils Relative: 0 %
Eosinophils Absolute: 0.1 10*3/uL (ref 0.0–0.5)
Eosinophils Relative: 1 %
HCT: 35 % — ABNORMAL LOW (ref 39.0–52.0)
Hemoglobin: 11.5 g/dL — ABNORMAL LOW (ref 13.0–17.0)
Immature Granulocytes: 1 %
Lymphocytes Relative: 28 %
Lymphs Abs: 1.9 10*3/uL (ref 0.7–4.0)
MCH: 33 pg (ref 26.0–34.0)
MCHC: 32.9 g/dL (ref 30.0–36.0)
MCV: 100.3 fL — ABNORMAL HIGH (ref 80.0–100.0)
Monocytes Absolute: 0.5 10*3/uL (ref 0.1–1.0)
Monocytes Relative: 7 %
Neutro Abs: 4.1 10*3/uL (ref 1.7–7.7)
Neutrophils Relative %: 63 %
Platelets: 276 10*3/uL (ref 150–400)
RBC: 3.49 MIL/uL — ABNORMAL LOW (ref 4.22–5.81)
RDW: 14.4 % (ref 11.5–15.5)
WBC: 6.6 10*3/uL (ref 4.0–10.5)
nRBC: 0 % (ref 0.0–0.2)

## 2022-08-10 LAB — PSA: Prostatic Specific Antigen: 220 ng/mL — ABNORMAL HIGH (ref 0.00–4.00)

## 2022-08-10 MED ORDER — SODIUM CHLORIDE 0.9% FLUSH
10.0000 mL | Freq: Once | INTRAVENOUS | Status: AC
  Administered 2022-08-10: 10 mL via INTRAVENOUS
  Filled 2022-08-10: qty 10

## 2022-08-10 MED ORDER — HEPARIN SOD (PORK) LOCK FLUSH 100 UNIT/ML IV SOLN
500.0000 [IU] | Freq: Once | INTRAVENOUS | Status: AC
  Administered 2022-08-10: 500 [IU] via INTRAVENOUS
  Filled 2022-08-10: qty 5

## 2022-08-10 NOTE — Progress Notes (Unsigned)
Worsening right arm/hand weakness with numbness in fingers.  Did take Gabapentin in the past but symptoms worsened so he stopped the medication.  He has stopped all prescribed medications other than Nubeqa for the past few weeks because he was unsure if they could be causing right arm/hand weakness.    BP 125/77, HR 92-without Metoprolol for a few weeks.

## 2022-08-10 NOTE — Progress Notes (Unsigned)
[address] [place] OFFICE PROGRESS NOTE  Patient Care Team: Steele Sizer, MD as PCP - General (Family Medicine) Cammie Sickle, MD as Consulting Physician (Oncology) Abbie Sons, MD (Urology)   Cancer Staging  Prostate cancer [facility]) Staging form: Prostate, AJCC 8th Edition - Clinical: Stage IVB (cT2c, cN1, cM1, PSA: 374, Grade Group: 5) - Signed by Cammie Sickle, MD on 12/13/2021 Prostate specific antigen (PSA) range: 20 or greater Histologic grading system: 5 grade system    Oncology History Overview Note  compatible with widespread metastatic prostate cancer, including innumerable sclerotic osseous lesions and extensive lymphadenopathy in the pelvis and retroperitoneum, as detailed above. 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 2. There is also an indeterminate hypovascular lesion in segment 5 of the liver which may represent a metastatic lesion. This could be better characterized with follow-up abdominal MRI with and without IV gadolinium if clinically appropriate. 3. Aortic atherosclerosis with mild fusiform aneurysmal dilatation of the infrarenal abdominal aorta measuring up to 3.2 x 3.0 cm. Recommend follow-up ultrasound every 3 years. This recommendation follows ACR consensus guidelines: White Paper of the ACR Incidental Findings Committee II on Vascular Findings. J Am Coll Radiol 2013; 10:789-794. 4. Bladder wall appears mildly thickened, likely related to bladder outlet obstruction given the enlarged prostate gland. 5. Severe colonic diverticulosis without evidence of acute diverticulitis at this time.  IMPRESSION: There are numerous foci of abnormal tracer uptake in the axial and proximal appendicular skeleton consistent with extensive skeletal metastatic disease.     #Stage IV castrate sensitive prostate cancer-December/20  2023-MRI negative for any liver lesions.; FEB 24th, 2022-Firmagon loading dose;  # march 24th-Taxoeter with udenyca; 4/12- add darolutamide  # DEC 2022- 82 [[place]; Urology]   Prostate cancer ([place])  12/13/2021 Initial Diagnosis   Prostate cancer ([place])   12/13/2021 Cancer Staging   Staging form: Prostate, AJCC 8th Edition - Clinical: Stage IVB (cT2c, cN1, cM1, PSA: 374, Grade Group: 5) - Signed by Cammie Sickle, MD on 12/13/2021 Prostate specific antigen (PSA) range: 20 or greater Histologic grading system: 5 grade system   01/12/2022 - 04/28/2022 Chemotherapy   Patient is on Treatment Plan : PROSTATE Docetaxel + Prednisone q21d      Genetic Testing   Negative genetic testing. No pathogenic variants identified on the [facility] Common Hereditary Cancers +RNA panel. VUS in BRIP1 called c.485G>T identified. The report date is 03/04/2022.  The Common Hereditary Cancers Panel + RNA offered by [facility] includes sequencing and/or deletion duplication testing of the following 47 genes: APC, ATM, AXIN2, BARD1, BMPR1A, BRCA1, BRCA2, BRIP1, CDH1, CDKN2A (p14ARF), CDKN2A (p16INK4a), CKD4, CHEK2, CTNNA1, DICER1, EPCAM (Deletion/duplication testing only), GREM1 (promoter region deletion/duplication testing only), KIT, MEN1, MLH1, MSH2, MSH3, MSH6, MUTYH, NBN, NF1, NHTL1, PALB2, PDGFRA, PMS2, POLD1, POLE, PTEN, RAD50, RAD51C, RAD51D, SDHB, SDHC, SDHD, SMAD4, SMARCA4. STK11, TP53, TSC1, TSC2, and VHL.  The following genes were evaluated for sequence changes only: SDHA and HOXB13 c.251G>A variant only.      HISTORY OF PRESENT ILLNESS: Ambulating with a cane.  With daughter.    Eric [name] 81 y.o.  male pleasant patient above history of castrate sensitive metastatic prostate cancer currently s/p  Taxotere chemotherapy x6 cycles; currently Eligard plus darolutamide is here for follow-up.  Worsening right arm/hand weakness with numbness in fingers.  Patient stated has been getting worse in the last 2  to 3 weeks.  No  falls.  Patient states to be compliant with Gabapentin in the past but since his symptoms worsened so he stopped the medication.  However continues to take darolutamide as recommended. However at times felt unsteady.  Also has history of vertigo.    Mild to moderate fatigue.  No nausea no vomiting.  No sores in the mouth.   Review of Systems  Constitutional:  Positive for malaise/fatigue and weight loss. Negative for chills, diaphoresis and fever.  HENT:  Negative for nosebleeds and sore throat.   Eyes:  Negative for double vision.  Respiratory:  Negative for cough, hemoptysis, sputum production, shortness of breath and wheezing.   Cardiovascular:  Negative for chest pain, palpitations, orthopnea and leg swelling.  Gastrointestinal:  Negative for abdominal pain, blood in stool, constipation, diarrhea, heartburn, melena, nausea and vomiting.  Genitourinary:  Negative for dysuria, frequency and urgency.  Musculoskeletal:  Positive for back pain and joint pain.  Skin: Negative.  Negative for itching and rash.  Neurological:  Negative for dizziness, tingling, focal weakness, weakness and headaches.  Endo/Heme/Allergies:  Does not bruise/bleed easily.  Psychiatric/Behavioral:  Negative for depression. The patient is not nervous/anxious and does not have insomnia.       PAST MEDICAL HISTORY :  Past Medical History:  Diagnosis Date   Cancer ([place])    COPD (chronic obstructive pulmonary disease) ([place])    Dyspnea    with exertion   Family history of cancer    GERD (gastroesophageal reflux disease)    occ   Hyperlipidemia    Hypertension     PAST SURGICAL HISTORY :   Past Surgical History:  Procedure Laterality Date   COLONOSCOPY     HEMORROIDECTOMY     IR IMAGING GUIDED PORT INSERTION  12/20/2021   PROSTATE BIOPSY N/A 10/26/2021   Procedure: PROSTATE BIOPSY;  Surgeon: Abbie Sons, MD;  Location: [facility] ORS;  Service: Urology;  Laterality: N/A;   TRANSRECTAL ULTRASOUND  N/A 10/26/2021   Procedure: TRANSRECTAL ULTRASOUND;  Surgeon: Abbie Sons, MD;  Location: [facility] ORS;  Service: Urology;  Laterality: N/A;    FAMILY HISTORY :   Family History  Problem Relation Age of Onset   Heart disease Mother    COPD Father    Cancer Brother 49       blood cancer; unk exact type   Cancer Maternal Aunt        unk type   Cancer Paternal Aunt        unk type   Cancer Paternal Uncle        one unk type; one had throat   Cancer Cousin        unk types    SOCIAL HISTORY:   Social History   Tobacco Use   Smoking status: Every Day    Packs/day: 0.50    Years: 60.00    Total pack years: 30.00    Types: Cigarettes   Smokeless tobacco: Never   Tobacco comments:    smoking cessation information provided  Vaping Use   Vaping Use: Never used  Substance Use Topics   Alcohol use: Not Currently   Drug use: Not Currently    ALLERGIES:  has No Known Allergies.  MEDICATIONS:  Current Outpatient Medications  Medication Sig Dispense Refill   acetaminophen (TYLENOL) 500 MG tablet Take 1 tablet (500 mg total) by mouth every 6 (six) hours as needed. 30 tablet 0   albuterol (VENTOLIN HFA) 108 (90 Base) MCG/ACT inhaler TAKE 2 PUFFS BY MOUTH  EVERY 6 HOURS AS NEEDED FOR WHEEZE OR SHORTNESS OF BREATH 18 each 5   Ca Carbonate-Mag Hydroxide (ROLAIDS PO) Take 1 tablet by mouth as needed.     darolutamide (NUBEQA) 300 MG tablet Take 2 tablets (600 mg total) by mouth 2 (two) times daily with a meal. 120 tablet 2   lidocaine-prilocaine (EMLA) cream Apply on the port. 30 -45 min  prior to port access. 30 g 3   atorvastatin (LIPITOR) 40 MG tablet Take 1 tablet (40 mg total) by mouth daily. In place of rosuvastatin due to caner treatment (Patient not taking: Reported on 08/10/2022) 90 tablet 1   ezetimibe (ZETIA) 10 MG tablet Take 1 tablet (10 mg total) by mouth every morning. (Patient not taking: Reported on 08/10/2022) 90 tablet 1   Fluticasone-Umeclidin-Vilant (TRELEGY ELLIPTA)  100-62.5-25 MCG/ACT AEPB Inhale 1 puff into the lungs daily. (Patient not taking: Reported on 08/10/2022) 3 each 1   gabapentin (NEURONTIN) 100 MG capsule Take 1 pill at nighttime for 1 week.  If tolerating well take 2 pills at night. (Patient not taking: Reported on 08/10/2022) 60 capsule 0   metoprolol succinate (TOPROL-XL) 25 MG 24 hr tablet Take 1 tablet (25 mg total) by mouth daily. Take with or immediately following a meal. (Patient not taking: Reported on 08/10/2022) 90 tablet 1   ondansetron (ZOFRAN) 8 MG tablet One pill every 8 hours as needed for nausea/vomitting. (Patient not taking: Reported on 08/10/2022) 40 tablet 1   prochlorperazine (COMPAZINE) 10 MG tablet Take 1 tablet (10 mg total) by mouth every 6 (six) hours as needed for nausea or vomiting. (Patient not taking: Reported on 08/10/2022) 40 tablet 1   tamsulosin (FLOMAX) 0.4 MG CAPS capsule TAKE 1 CAPSULE BY MOUTH EVERY DAY AFTER SUPPER (Patient not taking: Reported on 08/10/2022) 90 capsule 1   No current facility-administered medications for this visit.    PHYSICAL EXAMINATION: ECOG PERFORMANCE STATUS: 1 - Symptomatic but completely ambulatory  BP 125/77   Pulse 92   Temp 97.7 F (36.5 C) (Tympanic)   Resp 16   Wt 150 lb (68 kg)   BMI 24.21 kg/m   Filed Weights   08/10/22 1300  Weight: 150 lb (68 kg)   4-5 weakness noted right upper extremity grip strength.   Physical Exam Vitals and nursing note reviewed.  HENT:     Head: Normocephalic and atraumatic.     Mouth/Throat:     Pharynx: Oropharynx is clear.  Eyes:     Extraocular Movements: Extraocular movements intact.     Pupils: Pupils are equal, round, and reactive to light.  Cardiovascular:     Rate and Rhythm: Normal rate and regular rhythm.  Pulmonary:     Comments: Decreased breath sounds bilaterally.  Abdominal:     Palpations: Abdomen is soft.  Musculoskeletal:        General: Normal range of motion.     Cervical back: Normal range of motion.   Skin:    General: Skin is warm.  Neurological:     General: No focal deficit present.     Mental Status: He is alert and oriented to person, place, and time.  Psychiatric:        Behavior: Behavior normal.        Judgment: Judgment normal.     LABORATORY DATA:  I have reviewed the data as listed    Component Value Date/Time   NA 137 08/10/2022 1301   NA 142 08/17/2015 0928   K 4.2  08/10/2022 1301   CL 106 08/10/2022 1301   CO2 25 08/10/2022 1301   GLUCOSE 98 08/10/2022 1301   BUN 25 (H) 08/10/2022 1301   BUN 17 08/17/2015 0928   CREATININE 1.07 08/10/2022 1301   CREATININE 1.06 09/30/2021 1119   CALCIUM 8.8 (L) 08/10/2022 1301   PROT 6.8 08/10/2022 1301   PROT 6.8 08/17/2015 0928   ALBUMIN 3.5 08/10/2022 1301   ALBUMIN 4.1 08/17/2015 0928   AST 15 08/10/2022 1301   ALT 9 08/10/2022 1301   ALKPHOS 297 (H) 08/10/2022 1301   BILITOT 0.4 08/10/2022 1301   BILITOT 0.4 08/17/2015 0928   GFRNONAA >60 08/10/2022 1301   GFRNONAA 50 (L) 03/03/2021 1050   GFRAA 59 (L) 03/03/2021 1050    No results found for: "SPEP", "UPEP"  Lab Results  Component Value Date   WBC 6.6 08/10/2022   NEUTROABS 4.1 08/10/2022   HGB 11.5 (L) 08/10/2022   HCT 35.0 (L) 08/10/2022   MCV 100.3 (H) 08/10/2022   PLT 276 08/10/2022      Chemistry      Component Value Date/Time   NA 137 08/10/2022 1301   NA 142 08/17/2015 0928   K 4.2 08/10/2022 1301   CL 106 08/10/2022 1301   CO2 25 08/10/2022 1301   BUN 25 (H) 08/10/2022 1301   BUN 17 08/17/2015 0928   CREATININE 1.07 08/10/2022 1301   CREATININE 1.06 09/30/2021 1119      Component Value Date/Time   CALCIUM 8.8 (L) 08/10/2022 1301   ALKPHOS 297 (H) 08/10/2022 1301   AST 15 08/10/2022 1301   ALT 9 08/10/2022 1301   BILITOT 0.4 08/10/2022 1301   BILITOT 0.4 08/17/2015 3235       RADIOGRAPHIC STUDIES: I have personally reviewed the radiological images as listed and agreed with the findings in the report. No results found.    ASSESSMENT & PLAN:  Prostate cancer ([place]) #Stage IV-castrate sensitive prostate cancer with multiple bone metastases/extensive lymphadenopathy;  lymphadenopathy in the upper right hemipelvis which likely causes severe stenosis or complete occlusion of the right common iliac vein. MRI- liver-1st-March 2023 hemangioma.  Currently s/p cycle # 6 of Taxotere [60 mg per metered square]- finished JULY, 2023.   # Currently on  ADT-Eligard+  darolutamide today; 300 mg- 2 pills BID. Tolerating well; but unfortunately PSA rising.  I discussed my concerns for progressive disease noted by rising PSA.  Recommend PSMA PET scan.  Also discussed the use of   lutetium Lu-177 [four cycles every six weeks; responding patients could have an extra two cycles].  We will inform Dr. Nicole Kindred.   # PN- from taxotere- G-2-3; Right UE weakness-?  Stroke versus others.  Worsening in last 2-3 weeks-re-start baby asprin. Recommend MRI Brain with and without contrast especially given the progressive prostate cancer.  We will also make a referral to Dr. Mickeal Skinner.  # Prostatism symptoms: Continue Flomax; STABLE   # Weight loss/malnutrition-s/p evaluation with nutrition-  Improving/ STABLE   # IV access: functional refilled- emla cream.   # eligardq6M-last July 2023; [prefers 1-2 pm]  # DISPOSITION:   # refer to Dr.vaslow re: right UE weakness/ Peripheral neuropathy.  # PET scan ordered # MRI Brain STAT # follow up in 2-3 week MD; No labs-   - Dr.B   # 40 minutes face-to-face with the patient discussing the above plan of care; more than 50% of time spent on prognosis/ natural history; counseling and coordination.       Orders  Placed This Encounter  Procedures   NM PET (PSMA) SKULL TO MID THIGH    Standing Status:   Future    Standing Expiration Date:   08/11/2023    Order Specific Question:   If indicated for the ordered procedure, I authorize the administration of a radiopharmaceutical per Radiology protocol     Answer:   Yes    Order Specific Question:   Preferred imaging location?    Answer:   [facility]   MR BRAIN W WO CONTRAST    Standing Status:   Future    Standing Expiration Date:   08/11/2023    Order Specific Question:   If indicated for the ordered procedure, I authorize the administration of contrast media per Radiology protocol    Answer:   Yes    Order Specific Question:   What is the patient's sedation requirement?    Answer:   No Sedation    Order Specific Question:   Does the patient have a pacemaker or implanted devices?    Answer:   No    Order Specific Question:   Use SRS Protocol?    Answer:   No    Order Specific Question:   Preferred imaging location?    Answer:   [facility] (table limit-350lbs)   Amb Referral to Neuro Oncology    Referral Priority:   Routine    Referral Type:   Consultation    Referral Reason:   Specialty Services Required    Number of Visits Requested:   1   All questions were answered. The patient knows to call the clinic with any problems, questions or concerns.      Cammie Sickle, MD 08/11/2022 1:57 PM

## 2022-08-10 NOTE — Assessment & Plan Note (Addendum)
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 MRI- liver-1st-March 2023 hemangioma.  Currently s/p cycle # 6 of Taxotere [60 mg per metered square]- finished JULY, 2023.   # Currently on  ADT-Eligard+  darolutamide today; 300 mg- 2 pills BID. Tolerating well; but unfortunately PSA rising. discussed re: PET scan; and use of   # PN- from taxotere- G-2-3 monitor for now.;OFF gabapentin low-dose;   # Right UE weakness-- worsening in last 2-3 weeks-re-start baby asprin. Recommend MRI Brain;   # Prostatism symptoms: Continue Flomax; STABLE   # Weight loss/malnutrition-s/p evaluation with nutrition-  Improving/ STABLE   # IV access: functional refilled- emla cream.   # eligardq6M-last July 2023; [prefers 1-2 pm]  # DISPOSITION:   # refer to Dr.vaslow re: right UE weakness/ Peripheral neuropathy.  # PET scan ordered # MRI Brain STAT # follow up in 2-3 week MD; No labs-   - Dr.B

## 2022-08-11 ENCOUNTER — Encounter: Payer: Self-pay | Admitting: Internal Medicine

## 2022-08-11 ENCOUNTER — Ambulatory Visit
Admission: RE | Admit: 2022-08-11 | Discharge: 2022-08-11 | Disposition: A | Discharge status: Home / Self Care | Payer: Medicare HMO | Source: Ambulatory Visit | Attending: Internal Medicine | Admitting: Internal Medicine

## 2022-08-11 DIAGNOSIS — C61 Malignant neoplasm of prostate: Secondary | ICD-10-CM | POA: Diagnosis present

## 2022-08-11 DIAGNOSIS — R29898 Other symptoms and signs involving the musculoskeletal system: Secondary | ICD-10-CM | POA: Insufficient documentation

## 2022-08-11 MED ORDER — GADOBUTROL 1 MMOL/ML IV SOLN
6.0000 mL | Freq: Once | INTRAVENOUS | Status: AC | PRN
  Administered 2022-08-11: 6 mL via INTRAVENOUS

## 2022-08-12 ENCOUNTER — Telehealth: Payer: Self-pay | Admitting: *Deleted

## 2022-08-12 NOTE — Telephone Encounter (Signed)
Call placed to patients daughter Rhondie to review results of MRI brain. Patients daughter verbalized understanding and denies further questions or concerns at this time.

## 2022-08-12 NOTE — Telephone Encounter (Signed)
-----   Message from Cammie Sickle, MD sent at 08/11/2022  6:35 PM EDT ----- Tillie Rung- Please inform the patient's daughter that MRI brain does not show any evidence of cancer or stroke.   For now would recommend await evaluation with neurology, Dr. Mickeal Skinner.  Also await PET scan as ordered for further recommendations.   Thank you,  GB

## 2022-08-17 ENCOUNTER — Other Ambulatory Visit: Payer: Self-pay | Admitting: *Deleted

## 2022-08-17 DIAGNOSIS — C61 Malignant neoplasm of prostate: Secondary | ICD-10-CM

## 2022-08-17 DIAGNOSIS — C7951 Secondary malignant neoplasm of bone: Secondary | ICD-10-CM

## 2022-08-18 ENCOUNTER — Encounter: Payer: Self-pay | Admitting: Internal Medicine

## 2022-08-22 ENCOUNTER — Ambulatory Visit
Admission: RE | Admit: 2022-08-22 | Discharge: 2022-08-22 | Disposition: A | Discharge status: Home / Self Care | Payer: Medicare HMO | Source: Ambulatory Visit | Attending: Internal Medicine | Admitting: Internal Medicine

## 2022-08-22 ENCOUNTER — Other Ambulatory Visit: Payer: Self-pay | Admitting: *Deleted

## 2022-08-22 DIAGNOSIS — R29898 Other symptoms and signs involving the musculoskeletal system: Secondary | ICD-10-CM

## 2022-08-22 DIAGNOSIS — C61 Malignant neoplasm of prostate: Secondary | ICD-10-CM

## 2022-08-22 DIAGNOSIS — R531 Weakness: Secondary | ICD-10-CM | POA: Diagnosis not present

## 2022-08-22 DIAGNOSIS — C7951 Secondary malignant neoplasm of bone: Secondary | ICD-10-CM | POA: Diagnosis not present

## 2022-08-22 MED ORDER — PIFLIFOLASTAT F 18 (PYLARIFY) INJECTION
9.0000 | Freq: Once | INTRAVENOUS | Status: AC
  Administered 2022-08-22: 9.74 via INTRAVENOUS

## 2022-08-22 NOTE — Telephone Encounter (Signed)
Patient had PET today, refill pending those results

## 2022-08-23 ENCOUNTER — Encounter: Payer: Self-pay | Admitting: Internal Medicine

## 2022-08-23 MED ORDER — DAROLUTAMIDE 300 MG PO TABS: 600.0000 mg | ORAL_TABLET | Freq: Two times a day (BID) | ORAL | 2 refills | Status: DC

## 2022-08-23 NOTE — Telephone Encounter (Signed)
Per MD, okay to refill. Rx sent to [facility]

## 2022-08-23 NOTE — Addendum Note (Signed)
Addended by: Darl Pikes on: 08/23/2022 02:17 PM   Modules accepted: Orders

## 2022-08-23 NOTE — Telephone Encounter (Signed)
Call returned and informed that we are awaiting PET results before refilling his medicine. She was in agreement with this plan

## 2022-08-26 ENCOUNTER — Inpatient Hospital Stay: Payer: Medicare HMO | Attending: Internal Medicine | Admitting: Medical Oncology

## 2022-08-26 VITALS — BP 136/66 | HR 93 | Temp 97.6°F | Wt 148.0 lb

## 2022-08-26 DIAGNOSIS — C7951 Secondary malignant neoplasm of bone: Secondary | ICD-10-CM | POA: Diagnosis not present

## 2022-08-26 DIAGNOSIS — C61 Malignant neoplasm of prostate: Secondary | ICD-10-CM | POA: Diagnosis not present

## 2022-08-26 DIAGNOSIS — F17210 Nicotine dependence, cigarettes, uncomplicated: Secondary | ICD-10-CM | POA: Insufficient documentation

## 2022-08-26 DIAGNOSIS — M5412 Radiculopathy, cervical region: Secondary | ICD-10-CM | POA: Diagnosis not present

## 2022-08-26 DIAGNOSIS — R29898 Other symptoms and signs involving the musculoskeletal system: Secondary | ICD-10-CM

## 2022-08-26 DIAGNOSIS — G620 Drug-induced polyneuropathy: Secondary | ICD-10-CM

## 2022-08-26 DIAGNOSIS — N400 Enlarged prostate without lower urinary tract symptoms: Secondary | ICD-10-CM | POA: Insufficient documentation

## 2022-08-26 DIAGNOSIS — I10 Essential (primary) hypertension: Secondary | ICD-10-CM | POA: Diagnosis not present

## 2022-08-26 DIAGNOSIS — R634 Abnormal weight loss: Secondary | ICD-10-CM | POA: Diagnosis not present

## 2022-08-26 NOTE — Progress Notes (Signed)
[address] [facility] OFFICE PROGRESS NOTE  Patient Care Team: Steele Sizer, MD as PCP - General (Family Medicine) Cammie Sickle, MD as Consulting Physician (Oncology) Abbie Sons, MD (Urology)   Cancer Staging  Prostate cancer [facility]) Staging form: Prostate, AJCC 8th Edition - Clinical: Stage IVB (cT2c, cN1, cM1, PSA: 374, Grade Group: 5) - Signed by Cammie Sickle, MD on 12/13/2021 Prostate specific antigen (PSA) range: 20 or greater Histologic grading system: 5 grade system    Oncology History Overview Note  compatible with widespread metastatic prostate cancer, including innumerable sclerotic osseous lesions and extensive lymphadenopathy in the pelvis and retroperitoneum, as detailed above. 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 2. There is also an indeterminate hypovascular lesion in segment 5 of the liver which may represent a metastatic lesion. This could be better characterized with follow-up abdominal MRI with and without IV gadolinium if clinically appropriate. 3. Aortic atherosclerosis with mild fusiform aneurysmal dilatation of the infrarenal abdominal aorta measuring up to 3.2 x 3.0 cm. Recommend follow-up ultrasound every 3 years. This recommendation follows ACR consensus guidelines: White Paper of the ACR Incidental Findings Committee II on Vascular Findings. J Am Coll Radiol 2013; 10:789-794. 4. Bladder wall appears mildly thickened, likely related to bladder outlet obstruction given the enlarged prostate gland. 5. Severe colonic diverticulosis without evidence of acute diverticulitis at this time.  IMPRESSION: There are numerous foci of abnormal tracer uptake in the axial and proximal appendicular skeleton consistent with extensive skeletal metastatic disease.     #Stage IV castrate sensitive prostate cancer-December/20  2023-MRI negative for any liver lesions.; FEB 24th, 2022-Firmagon loading dose;  # march 24th-Taxoeter with udenyca; 4/12- add darolutamide  # DEC 2022- 86 [[place]; Urology]   Prostate cancer ([place])  12/13/2021 Initial Diagnosis   Prostate cancer ([place])   12/13/2021 Cancer Staging   Staging form: Prostate, AJCC 8th Edition - Clinical: Stage IVB (cT2c, cN1, cM1, PSA: 374, Grade Group: 5) - Signed by Cammie Sickle, MD on 12/13/2021 Prostate specific antigen (PSA) range: 20 or greater Histologic grading system: 5 grade system   01/12/2022 - 04/28/2022 Chemotherapy   Patient is on Treatment Plan : PROSTATE Docetaxel + Prednisone q21d      Genetic Testing   Negative genetic testing. No pathogenic variants identified on the [facility] Common Hereditary Cancers +RNA panel. VUS in BRIP1 called c.485G>T identified. The report date is 03/04/2022.  The Common Hereditary Cancers Panel + RNA offered by [facility] includes sequencing and/or deletion duplication testing of the following 47 genes: APC, ATM, AXIN2, BARD1, BMPR1A, BRCA1, BRCA2, BRIP1, CDH1, CDKN2A (p14ARF), CDKN2A (p16INK4a), CKD4, CHEK2, CTNNA1, DICER1, EPCAM (Deletion/duplication testing only), GREM1 (promoter region deletion/duplication testing only), KIT, MEN1, MLH1, MSH2, MSH3, MSH6, MUTYH, NBN, NF1, NHTL1, PALB2, PDGFRA, PMS2, POLD1, POLE, PTEN, RAD50, RAD51C, RAD51D, SDHB, SDHC, SDHD, SMAD4, SMARCA4. STK11, TP53, TSC1, TSC2, and VHL.  The following genes were evaluated for sequence changes only: SDHA and HOXB13 c.251G>A variant only.      HISTORY OF PRESENT ILLNESS: Ambulating with a cane.  With daughter.    Eric [name] 81 y.o.  male pleasant patient above history of castrate sensitive metastatic prostate cancer currently s/p  Taxotere chemotherapy x6 cycles; currently Eligard plus darolutamide is here for follow-up.  Right arm/hand weakness with numbness in fingers is stable. He has a visit on 09/09/2022 with Dr. Mickeal Skinner to  discuss this further.  Other than this he feels like he doing well. Continues to have some mild to moderate fatigue.  No nausea no vomiting.  No sores in the mouth. No unintentional weight loss- they report his weight fluctuates up and down by a few pounds normally.   Wt Readings from Last 3 Encounters:  08/26/22 148 lb (67.1 kg)  08/10/22 150 lb (68 kg)  07/11/22 146 lb (66.2 kg)    Review of Systems  Constitutional:  Positive for malaise/fatigue and weight loss. Negative for chills, diaphoresis and fever.  HENT:  Negative for nosebleeds and sore throat.   Eyes:  Negative for double vision.  Respiratory:  Negative for cough, hemoptysis, sputum production, shortness of breath and wheezing.   Cardiovascular:  Negative for chest pain, palpitations, orthopnea and leg swelling.  Gastrointestinal:  Negative for abdominal pain, blood in stool, constipation, diarrhea, heartburn, melena, nausea and vomiting.  Genitourinary:  Negative for dysuria, frequency and urgency.  Musculoskeletal:  Positive for back pain and joint pain.  Skin: Negative.  Negative for itching and rash.  Neurological:  Negative for dizziness, tingling, focal weakness, weakness and headaches.  Endo/Heme/Allergies:  Does not bruise/bleed easily.  Psychiatric/Behavioral:  Negative for depression. The patient is not nervous/anxious and does not have insomnia.       PAST MEDICAL HISTORY :  Past Medical History:  Diagnosis Date   Cancer ([place])    COPD (chronic obstructive pulmonary disease) ([place])    Dyspnea    with exertion   Family history of cancer    GERD (gastroesophageal reflux disease)    occ   Hyperlipidemia    Hypertension     PAST SURGICAL HISTORY :   Past Surgical History:  Procedure Laterality Date   COLONOSCOPY     HEMORROIDECTOMY     IR IMAGING GUIDED PORT INSERTION  12/20/2021   PROSTATE BIOPSY N/A 10/26/2021   Procedure: PROSTATE BIOPSY;  Surgeon: Abbie Sons, MD;  Location: [facility] ORS;  Service:  Urology;  Laterality: N/A;   TRANSRECTAL ULTRASOUND N/A 10/26/2021   Procedure: TRANSRECTAL ULTRASOUND;  Surgeon: Abbie Sons, MD;  Location: [facility] ORS;  Service: Urology;  Laterality: N/A;    FAMILY HISTORY :   Family History  Problem Relation Age of Onset   Heart disease Mother    COPD Father    Cancer Brother 80       blood cancer; unk exact type   Cancer Maternal Aunt        unk type   Cancer Paternal Aunt        unk type   Cancer Paternal Uncle        one unk type; one had throat   Cancer Cousin        unk types    SOCIAL HISTORY:   Social History   Tobacco Use   Smoking status: Every Day    Packs/day: 0.50    Years: 60.00    Total pack years: 30.00    Types: Cigarettes   Smokeless tobacco: Never   Tobacco comments:    smoking cessation information provided  Vaping Use   Vaping Use: Never used  Substance Use Topics   Alcohol use: Not Currently   Drug use: Not Currently    ALLERGIES:  has No Known Allergies.  MEDICATIONS:  Current Outpatient Medications  Medication Sig Dispense Refill   acetaminophen (TYLENOL) 500 MG tablet Take 1 tablet (500 mg total) by mouth every 6 (six) hours as needed. 30 tablet 0  albuterol (VENTOLIN HFA) 108 (90 Base) MCG/ACT inhaler TAKE 2 PUFFS BY MOUTH EVERY 6 HOURS AS NEEDED FOR WHEEZE OR SHORTNESS OF BREATH 18 each 5   Ca Carbonate-Mag Hydroxide (ROLAIDS PO) Take 1 tablet by mouth as needed.     darolutamide (NUBEQA) 300 MG tablet Take 2 tablets (600 mg total) by mouth 2 (two) times daily with a meal. 120 tablet 2   lidocaine-prilocaine (EMLA) cream Apply on the port. 30 -45 min  prior to port access. 30 g 3   atorvastatin (LIPITOR) 40 MG tablet Take 1 tablet (40 mg total) by mouth daily. In place of rosuvastatin due to caner treatment (Patient not taking: Reported on 08/10/2022) 90 tablet 1   ezetimibe (ZETIA) 10 MG tablet Take 1 tablet (10 mg total) by mouth every morning. (Patient not taking: Reported on 08/10/2022) 90 tablet  1   Fluticasone-Umeclidin-Vilant (TRELEGY ELLIPTA) 100-62.5-25 MCG/ACT AEPB Inhale 1 puff into the lungs daily. (Patient not taking: Reported on 08/10/2022) 3 each 1   gabapentin (NEURONTIN) 100 MG capsule Take 1 pill at nighttime for 1 week.  If tolerating well take 2 pills at night. (Patient not taking: Reported on 08/10/2022) 60 capsule 0   metoprolol succinate (TOPROL-XL) 25 MG 24 hr tablet Take 1 tablet (25 mg total) by mouth daily. Take with or immediately following a meal. (Patient not taking: Reported on 08/10/2022) 90 tablet 1   ondansetron (ZOFRAN) 8 MG tablet One pill every 8 hours as needed for nausea/vomitting. (Patient not taking: Reported on 08/10/2022) 40 tablet 1   prochlorperazine (COMPAZINE) 10 MG tablet Take 1 tablet (10 mg total) by mouth every 6 (six) hours as needed for nausea or vomiting. (Patient not taking: Reported on 08/10/2022) 40 tablet 1   tamsulosin (FLOMAX) 0.4 MG CAPS capsule TAKE 1 CAPSULE BY MOUTH EVERY DAY AFTER SUPPER (Patient not taking: Reported on 08/10/2022) 90 capsule 1   No current facility-administered medications for this visit.    PHYSICAL EXAMINATION: ECOG PERFORMANCE STATUS: 1 - Symptomatic but completely ambulatory  BP 125/77   Pulse 92   Temp 97.7 F (36.5 C) (Tympanic)   Resp 16   Wt 150 lb (68 kg)   BMI 24.21 kg/m   Filed Weights   08/26/22 1250  Weight: 148 lb (67.1 kg)   4-5 weakness noted right upper extremity grip strength.   Physical Exam Vitals and nursing note reviewed.  HENT:     Head: Normocephalic and atraumatic.     Mouth/Throat:     Pharynx: Oropharynx is clear.  Eyes:     Extraocular Movements: Extraocular movements intact.     Pupils: Pupils are equal, round, and reactive to light.  Cardiovascular:     Rate and Rhythm: Normal rate and regular rhythm.  Pulmonary:     Comments: Decreased breath sounds bilaterally.  Abdominal:     Palpations: Abdomen is soft.  Musculoskeletal:        General: Normal range of  motion.     Cervical back: Normal range of motion.  Skin:    General: Skin is warm.  Neurological:     General: No focal deficit present.     Mental Status: He is alert and oriented to person, place, and time.  Psychiatric:        Behavior: Behavior normal.        Judgment: Judgment normal.     LABORATORY DATA:  I have reviewed the data as listed    Component Value Date/Time   NA  137 08/10/2022 1301   NA 142 08/17/2015 0928   K 4.2 08/10/2022 1301   CL 106 08/10/2022 1301   CO2 25 08/10/2022 1301   GLUCOSE 98 08/10/2022 1301   BUN 25 (H) 08/10/2022 1301   BUN 17 08/17/2015 0928   CREATININE 1.07 08/10/2022 1301   CREATININE 1.06 09/30/2021 1119   CALCIUM 8.8 (L) 08/10/2022 1301   PROT 6.8 08/10/2022 1301   PROT 6.8 08/17/2015 0928   ALBUMIN 3.5 08/10/2022 1301   ALBUMIN 4.1 08/17/2015 0928   AST 15 08/10/2022 1301   ALT 9 08/10/2022 1301   ALKPHOS 297 (H) 08/10/2022 1301   BILITOT 0.4 08/10/2022 1301   BILITOT 0.4 08/17/2015 0928   GFRNONAA >60 08/10/2022 1301   GFRNONAA 50 (L) 03/03/2021 1050   GFRAA 59 (L) 03/03/2021 1050    No results found for: "SPEP", "UPEP"  Lab Results  Component Value Date   WBC 6.6 08/10/2022   NEUTROABS 4.1 08/10/2022   HGB 11.5 (L) 08/10/2022   HCT 35.0 (L) 08/10/2022   MCV 100.3 (H) 08/10/2022   PLT 276 08/10/2022      Chemistry      Component Value Date/Time   NA 137 08/10/2022 1301   NA 142 08/17/2015 0928   K 4.2 08/10/2022 1301   CL 106 08/10/2022 1301   CO2 25 08/10/2022 1301   BUN 25 (H) 08/10/2022 1301   BUN 17 08/17/2015 0928   CREATININE 1.07 08/10/2022 1301   CREATININE 1.06 09/30/2021 1119      Component Value Date/Time   CALCIUM 8.8 (L) 08/10/2022 1301   ALKPHOS 297 (H) 08/10/2022 1301   AST 15 08/10/2022 1301   ALT 9 08/10/2022 1301   BILITOT 0.4 08/10/2022 1301   BILITOT 0.4 08/17/2015 9528       RADIOGRAPHIC STUDIES: I have personally reviewed the radiological images as listed and agreed with  the findings in the report. No results found.   ASSESSMENT & PLAN:  Prostate cancer ([place]) #Stage IV-castrate sensitive prostate cancer with multiple bone metastases/extensive lymphadenopathy;  lymphadenopathy in the upper right hemipelvis which likely causes severe stenosis or complete occlusion of the right common iliac vein. MRI- liver-1st-March 2023 hemangioma.  Currently s/p cycle # 6 of Taxotere [60 mg per metered square]- finished JULY, 2023.   # Currently on  ADT-Eligard+  darolutamide today; 300 mg- 2 pills BID. Lutetium Lu-177 [four cycles every six weeks; responding patients could have an extra two cycles] suggested to Dr. Nicole Kindred. Currently he is tolerating his medications well. His recent PET scan from 08/22/2022 appears similar to past. His PSA continues to rise though- will recheck at follow up.   # PN- from taxotere- G-2-3; Right UE weakness- MR was unremarkable. Has appointment scheduled with Dr. Mickeal Skinner in 2 weeks.  # Prostatism symptoms: Chronic- not worsened. Continue Flomax; STABLE   # Weight loss/malnutrition-s/p evaluation with nutrition- Chronic - off/on. Continue nutritional work up  [employer] July 2023; [prefers 1-2 pm]- Due In January 2024  # DISPOSITION:   Adding MD, and lab appointment with Dr. Jacinto Reap on Dec 6th along with his port flush.   # 15 minutes face-to-face with the patient discussing the above plan of care; more than 50% of time spent on prognosis/ natural history; counseling and coordination.   Orders Placed This Encounter  Procedures   NM PET (PSMA) SKULL TO MID THIGH    Standing Status:   Future    Standing Expiration Date:   08/11/2023  Order Specific Question:   If indicated for the ordered procedure, I authorize the administration of a radiopharmaceutical per Radiology protocol    Answer:   Yes    Order Specific Question:   Preferred imaging location?    Answer:   [facility]   MR BRAIN W WO CONTRAST    Standing Status:   Future     Standing Expiration Date:   08/11/2023    Order Specific Question:   If indicated for the ordered procedure, I authorize the administration of contrast media per Radiology protocol    Answer:   Yes    Order Specific Question:   What is the patient's sedation requirement?    Answer:   No Sedation    Order Specific Question:   Does the patient have a pacemaker or implanted devices?    Answer:   No    Order Specific Question:   Use SRS Protocol?    Answer:   No    Order Specific Question:   Preferred imaging location?    Answer:   [facility] (table limit-350lbs)   Amb Referral to Neuro Oncology    Referral Priority:   Routine    Referral Type:   Consultation    Referral Reason:   Specialty Services Required    Number of Visits Requested:   1   All questions were answered. The patient knows to call the clinic with any problems, questions or concerns.      Hughie Closs, PA-C 08/26/2022 3:56 PM

## 2022-08-31 ENCOUNTER — Ambulatory Visit: Payer: Medicare HMO | Admitting: Family Medicine

## 2022-08-31 ENCOUNTER — Other Ambulatory Visit: Payer: Self-pay | Admitting: Family Medicine

## 2022-09-09 ENCOUNTER — Inpatient Hospital Stay (HOSPITAL_BASED_OUTPATIENT_CLINIC_OR_DEPARTMENT_OTHER): Payer: Medicare HMO | Admitting: Internal Medicine

## 2022-09-09 VITALS — BP 136/75 | HR 96 | Temp 97.8°F | Resp 18 | Ht 66.0 in | Wt 144.0 lb

## 2022-09-09 DIAGNOSIS — M5412 Radiculopathy, cervical region: Secondary | ICD-10-CM | POA: Insufficient documentation

## 2022-09-09 DIAGNOSIS — M5410 Radiculopathy, site unspecified: Secondary | ICD-10-CM | POA: Diagnosis not present

## 2022-09-09 DIAGNOSIS — C61 Malignant neoplasm of prostate: Secondary | ICD-10-CM | POA: Diagnosis not present

## 2022-09-09 NOTE — Progress Notes (Signed)
[facility] [place], [place] [address] [phone]   New Patient Evaluation  Date of Service: 09/09/22 Patient Name: Eric [name] Patient MRN: [id number] Patient DOB: December 18, 1940 Provider: Ventura Sellers, MD  Identifying Statement:  Eric [name] is a 81 y.o. male with Radiculopathy affecting upper extremity - Plan: MR [place], Ambulatory referral to Physical Therapy  Cervical radiculopathy who presents for initial consultation and evaluation regarding cancer associated neurologic deficits.    Referring Provider: Cammie Sickle, MD [address] [place],  [place] [address]  Primary Cancer:  Oncologic History: Oncology History Overview Note  compatible with widespread metastatic prostate cancer, including innumerable sclerotic osseous lesions and extensive lymphadenopathy in the pelvis and retroperitoneum, as detailed above. 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 2. There is also an indeterminate hypovascular lesion in segment 5 of the liver which may represent a metastatic lesion. This could be better characterized with follow-up abdominal MRI with and without IV gadolinium if clinically appropriate. 3. Aortic atherosclerosis with mild fusiform aneurysmal dilatation of the infrarenal abdominal aorta measuring up to 3.2 x 3.0 cm. Recommend follow-up ultrasound every 3 years. This recommendation follows ACR consensus guidelines: White Paper of the ACR Incidental Findings Committee II on Vascular Findings. J Am Coll Radiol 2013; 10:789-794. 4. Bladder wall appears mildly thickened, likely related to bladder outlet obstruction given the enlarged prostate gland. 5. Severe colonic diverticulosis without evidence of acute diverticulitis at this time.  IMPRESSION: There are  numerous foci of abnormal tracer uptake in the axial and proximal appendicular skeleton consistent with extensive skeletal metastatic disease.     #Stage IV castrate sensitive prostate cancer-December/20 2023-MRI negative for any liver lesions.; FEB 24th, 2022-Firmagon loading dose;  # march 24th-Taxoeter with udenyca; 4/12- add darolutamide  # DEC 2022- 56 [[facility]; Urology]   Prostate cancer ([place])  12/13/2021 Initial Diagnosis   Prostate cancer ([place])   12/13/2021 Cancer Staging   Staging form: Prostate, AJCC 8th Edition - Clinical: Stage IVB (cT2c, cN1, cM1, PSA: 374, Grade Group: 5) - Signed by Cammie Sickle, MD on 12/13/2021 Prostate specific antigen (PSA) range: 20 or greater Histologic grading system: 5 grade system   01/12/2022 - 04/28/2022 Chemotherapy   Patient is on Treatment Plan : PROSTATE Docetaxel + Prednisone q21d      Genetic Testing   Negative genetic testing. No pathogenic variants identified on the [facility] Common Hereditary Cancers +RNA panel. VUS in BRIP1 called c.485G>T identified. The report date is 03/04/2022.  The Common Hereditary Cancers Panel + RNA offered by [facility] includes sequencing and/or deletion duplication testing of the following 47 genes: APC, ATM, AXIN2, BARD1, BMPR1A, BRCA1, BRCA2, BRIP1, CDH1, CDKN2A (p14ARF), CDKN2A (p16INK4a), CKD4, CHEK2, CTNNA1, DICER1, EPCAM (Deletion/duplication testing only), GREM1 (promoter region deletion/duplication testing only), KIT, MEN1, MLH1, MSH2, MSH3, MSH6, MUTYH, NBN, NF1, NHTL1, PALB2, PDGFRA, PMS2, POLD1, POLE, PTEN, RAD50, RAD51C, RAD51D, SDHB, SDHC, SDHD, SMAD4, SMARCA4. STK11, TP53, TSC1, TSC2, and VHL.  The following genes were evaluated for sequence changes only: SDHA and HOXB13 c.251G>A variant only.     History of Present Illness: The patient's records from the referring physician were obtained and reviewed and the patient interviewed to confirm this HPI.  Eric [name] presents today to  review right hand symptoms.  He describes several months history of mostly painless weakness, and also  numbness.  Over the summer he started noticing some difficulty with his right hand, had hard time opening the gas cap on his car.  Some ADLs were limited by the weakness.  In recent weeks though, this has actually improved.  His "little fingers" are more affected than the thumb, index finger.  Numbness affects all the digits.  There is mild numbness in bottoms of his feet as well, no pain or weakness there.  MRI brain was done recently through Dr. Rogue Bussing.    Medications: Current Outpatient Medications on File Prior to Visit  Medication Sig Dispense Refill   acetaminophen (TYLENOL) 500 MG tablet Take 1 tablet (500 mg total) by mouth every 6 (six) hours as needed. 30 tablet 0   albuterol (VENTOLIN HFA) 108 (90 Base) MCG/ACT inhaler TAKE 2 PUFFS BY MOUTH EVERY 6 HOURS AS NEEDED FOR WHEEZE OR SHORTNESS OF BREATH 18 each 5   atorvastatin (LIPITOR) 40 MG tablet TAKE 1 TABLET (40 MG TOTAL) BY MOUTH DAILY. IN PLACE OF ROSUVASTATIN DUE TO CANER TREATMENT 90 tablet 1   Ca Carbonate-Mag Hydroxide (ROLAIDS PO) Take 1 tablet by mouth as needed.     darolutamide (NUBEQA) 300 MG tablet Take 2 tablets (600 mg total) by mouth 2 (two) times daily with a meal. 120 tablet 2   ezetimibe (ZETIA) 10 MG tablet Take 1 tablet (10 mg total) by mouth every morning. 90 tablet 1   Fluticasone-Umeclidin-Vilant (TRELEGY ELLIPTA) 100-62.5-25 MCG/ACT AEPB Inhale 1 puff into the lungs daily. 3 each 1   lidocaine-prilocaine (EMLA) cream Apply on the port. 30 -45 min  prior to port access. 30 g 3   lisinopril (ZESTRIL) 2.5 MG tablet Take by mouth.     metoprolol succinate (TOPROL-XL) 25 MG 24 hr tablet Take 1 tablet (25 mg total) by mouth daily. Take with or immediately following a meal. 90 tablet 1   tamsulosin (FLOMAX) 0.4 MG CAPS capsule TAKE 1 CAPSULE BY MOUTH EVERY DAY AFTER SUPPER 90 capsule 1   gabapentin (NEURONTIN) 100 MG  capsule Take 1 pill at nighttime for 1 week.  If tolerating well take 2 pills at night. (Patient not taking: Reported on 08/10/2022) 60 capsule 0   ondansetron (ZOFRAN) 8 MG tablet One pill every 8 hours as needed for nausea/vomitting. (Patient not taking: Reported on 08/10/2022) 40 tablet 1   prochlorperazine (COMPAZINE) 10 MG tablet Take 1 tablet (10 mg total) by mouth every 6 (six) hours as needed for nausea or vomiting. (Patient not taking: Reported on 08/10/2022) 40 tablet 1   No current facility-administered medications on file prior to visit.    Allergies: No Known Allergies Past Medical History:  Past Medical History:  Diagnosis Date   Cancer ([place])    COPD (chronic obstructive pulmonary disease) ([place])    Dyspnea    with exertion   Family history of cancer    GERD (gastroesophageal reflux disease)    occ   Hyperlipidemia    Hypertension    Past Surgical History:  Past Surgical History:  Procedure Laterality Date   COLONOSCOPY     HEMORROIDECTOMY     IR IMAGING GUIDED PORT INSERTION  12/20/2021   PROSTATE BIOPSY N/A 10/26/2021   Procedure: PROSTATE BIOPSY;  Surgeon: Abbie Sons, MD;  Location: [facility] ORS;  Service: Urology;  Laterality: N/A;   TRANSRECTAL ULTRASOUND N/A 10/26/2021   Procedure: TRANSRECTAL ULTRASOUND;  Surgeon: Abbie Sons, MD;  Location: [facility] ORS;  Service: Urology;  Laterality: N/A;   Social History:  Social History   Socioeconomic History   Marital status: Single    Spouse name: Not on file   Number of children: 1   Years of education: Not on file   Highest education level: High school graduate  Occupational History   Occupation: retired   Tobacco Use   Smoking status: Every Day    Packs/day: 0.50    Years: 60.00    Total pack years: 30.00    Types: Cigarettes   Smokeless tobacco: Never   Tobacco comments:    smoking cessation information provided  Vaping Use   Vaping Use: Never used  Substance and Sexual Activity   Alcohol use: Not  Currently   Drug use: Not Currently   Sexual activity: Not Currently  Other Topics Concern   Not on file  Social History Narrative   Single, lives with male friend Abbott Pao is care giver] for the past 20 plus years. 1/2 ppd smoke; no alcohol. 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 Daughter lives 10-15 mins; in [place].    Social Determinants of Health   Financial Resource Strain: Low Risk  (02/10/2022)   Overall Financial Resource Strain (CARDIA)    Difficulty of Paying Living Expenses: Not hard at all  Food Insecurity: No Food Insecurity (02/10/2022)   Hunger Vital Sign    Worried About Running Out of Food in the Last Year: Never true    Ran Out of Food in the Last Year: Never true  Transportation Needs: No Transportation Needs (02/10/2022)   PRAPARE - Hydrologist (Medical): No    Lack of Transportation (Non-Medical): No  Physical Activity: Inactive (02/10/2022)   Exercise Vital Sign    Days of Exercise per Week: 0 days    Minutes of Exercise per Session: 0 min  Stress: No Stress Concern Present (02/10/2022)   [name]    Feeling of Stress : Only a little  Social Connections: Socially Isolated (02/10/2022)   Social Connection and Isolation Panel [NHANES]    Frequency of Communication with Friends and Family: More than three times a week    Frequency of Social Gatherings with Friends and Family: Three times a week    Attends Religious Services: Never    Active Member of Clubs or Organizations: No    Attends Archivist Meetings: Never    Marital Status: Widowed  Intimate Partner Violence: Not At Risk (02/10/2022)   Humiliation, Afraid, Rape, and Kick questionnaire    Fear of Current or Ex-Partner: No    Emotionally Abused: No    Physically Abused: No    Sexually Abused: No   Family History:  Family History  Problem Relation Age of Onset   Heart disease  Mother    COPD Father    Cancer Brother 5       blood cancer; unk exact type   Cancer Maternal Aunt        unk type   Cancer Paternal Aunt        unk type   Cancer Paternal Uncle        one unk type; one had throat   Cancer Cousin        unk types    Review of Systems: Constitutional: Doesn't report fevers, chills or abnormal weight loss Eyes: Doesn't report blurriness of vision Ears, nose, mouth, throat, and face: Doesn't report sore throat Respiratory: Doesn't report cough, dyspnea or wheezes Cardiovascular: Doesn't  report palpitation, chest discomfort  Gastrointestinal:  Doesn't report nausea, constipation, diarrhea GU: Doesn't report incontinence Skin: Doesn't report skin rashes Neurological: Per HPI Musculoskeletal: Doesn't report joint pain Behavioral/Psych: Doesn't report anxiety  Physical Exam: Vitals:   09/09/22 0955  BP: 136/75  Pulse: 96  Resp: 18  Temp: 97.8 F (36.6 C)  SpO2: 99%   KPS: 80. General: Alert, cooperative, pleasant, in no acute distress Head: Normal EENT: No conjunctival injection or scleral icterus.  Lungs: Resp effort normal Cardiac: Regular rate Abdomen: Non-distended abdomen Skin: No rashes cyanosis or petechiae. Extremities: No clubbing or edema  Neurologic Exam: Mental Status: Awake, alert, attentive to examiner. Oriented to self and environment. Language is fluent with intact comprehension.  Cranial Nerves: Visual acuity is grossly normal. Visual fields are full. Extra-ocular movements intact. No ptosis. Face is symmetric Motor: Tone is normal.  Right hypothenar atrophy noted.  Weakness in right hand with 4th/5th digit extension, abduction to thumb. Reflexes are symmetric, no pathologic reflexes present.  Sensory: Intact to light touch Gait: Normal.   Labs: I have reviewed the data as listed    Component Value Date/Time   NA 137 08/10/2022 1301   NA 142 08/17/2015 0928   K 4.2 08/10/2022 1301   CL 106 08/10/2022 1301    CO2 25 08/10/2022 1301   GLUCOSE 98 08/10/2022 1301   BUN 25 (H) 08/10/2022 1301   BUN 17 08/17/2015 0928   CREATININE 1.07 08/10/2022 1301   CREATININE 1.06 09/30/2021 1119   CALCIUM 8.8 (L) 08/10/2022 1301   PROT 6.8 08/10/2022 1301   PROT 6.8 08/17/2015 0928   ALBUMIN 3.5 08/10/2022 1301   ALBUMIN 4.1 08/17/2015 0928   AST 15 08/10/2022 1301   ALT 9 08/10/2022 1301   ALKPHOS 297 (H) 08/10/2022 1301   BILITOT 0.4 08/10/2022 1301   BILITOT 0.4 08/17/2015 0928   GFRNONAA >60 08/10/2022 1301   GFRNONAA 50 (L) 03/03/2021 1050   GFRAA 59 (L) 03/03/2021 1050   Lab Results  Component Value Date   WBC 6.6 08/10/2022   NEUTROABS 4.1 08/10/2022   HGB 11.5 (L) 08/10/2022   HCT 35.0 (L) 08/10/2022   MCV 100.3 (H) 08/10/2022   PLT 276 08/10/2022    Imaging:  NM PET (PSMA) SKULL TO MID THIGH  Result Date: 08/22/2022 CLINICAL DATA:  New diagnosis prostate carcinoma. Diagnosis February 2023. Post 6 cycles attacks ane chemotherapy EXAM: NUCLEAR MEDICINE PET SKULL BASE TO THIGH TECHNIQUE: 9.7 mCi F18 Piflufolastat (Pylarify) was injected intravenously. Full-ring PET imaging was performed from the skull base to thigh after the radiotracer. CT data was obtained and used for attenuation correction and anatomic localization. COMPARISON:  CT and bone scan 12/20/2021 FINDINGS: NECK No radiotracer activity in neck lymph nodes. Incidental CT finding: None. CHEST No radiotracer accumulation within mediastinal or hilar lymph nodes. No suspicious pulmonary nodules on the CT scan. Incidental CT finding: None. ABDOMEN/PELVIS Prostate: No focal activity within the prostate gland. Lymph nodes: No radiotracer avid pelvic lymph nodes remain. The previously described enlarged pelvic lymph nodes are no longer measurable. The radiotracer avid periaortic adenopathy or enlarged nodes. Liver: No evidence of liver metastasis. Incidental CT finding: None. SKELETON Widespread intensely radiotracer avid skeletal metastasis.  the pattern is very similar to bone scan. Innumerable lesions within the proximal femurs, pelvis, spine, ribs and humeri. The radiotracer avid lesions are accompanied sclerotic change on companion CT. Example lesion in the LEFT iliac wing with SUV max equal 20.5. Example lesion at L5 with SUV  max equal 37.6 Example lesion at C2 with SUV max equal 10.0. IMPRESSION: 1. Multiple intensely radiotracer avid prostate cancer skeletal metastasis within the axillary and appendicular skeleton as described above. Lesion too numerous to count. Pattern similar to bone scan 11/30/2021. 2. Resolution metastatic adenopathy in the pelvis and periaortic retroperitoneum. 3. No focal activity within the prostate gland. A Electronically Signed   By: Suzy Bouchard M.D.   On: 08/22/2022 15:03   MR BRAIN W WO CONTRAST  Result Date: 08/11/2022 CLINICAL DATA:  Right arm weakness.  Prostate cancer EXAM: MRI HEAD WITHOUT AND WITH CONTRAST TECHNIQUE: Multiplanar, multiecho pulse sequences of the brain and surrounding structures were obtained without and with intravenous contrast. CONTRAST:  41m GADAVIST GADOBUTROL 1 MMOL/ML IV SOLN COMPARISON:  MRI head 08/09/2020 FINDINGS: Brain: No acute infarction, hemorrhage, hydrocephalus, extra-axial collection or mass lesion. Mild to moderate white matter changes with patchy small white matter hyperintensities bilaterally. Brainstem and cerebellum normal. No enhancing metastatic deposits in the brain Vascular: Normal arterial flow voids Skull and upper cervical spine: Negative calvarium. Hypointense lesions C3 and C4 consistent with metastatic disease as noted on bone scan 11/30/2021 Sinuses/Orbits: Paranasal sinuses clear. Negative orbit. Small right mastoid effusion Other: None IMPRESSION: Negative for metastatic disease to the brain. Mild to moderate white matter changes most consistent with chronic microvascular ischemia. Negative for acute infarct C3 and C4 lesions consistent with metastatic  disease. Electronically Signed   By: CFranchot GalloM.D.   On: 08/11/2022 18:30     Assessment/Plan Radiculopathy affecting upper extremity - Plan: MR CERVICAL SPINE W WO CONTRAST, Ambulatory referral to Physical Therapy  Cervical radiculopathy  GSTEVIE CHARTERpresents with clinical syndrome localizing to peripheral nerve or root, C7-T1 distribution.  Etiology is unclear, but pathology was likely exacerbated by taxotere chemotherapy.   MRI brain demonstrated normal parenchyma, but bony metastases visible in upper cervical spine.  We will image the cervical spine with and w/o contrast to assess burden of metastatic disease, particularly in lower c-spine.  Will defer EMG for now.  In the meantime will refer for physical therapy given motor symptoms.  We spent twenty additional minutes teaching regarding the natural history, biology, and historical experience in the treatment of neurologic complications of cancer.   We appreciate the opportunity to participate in the care of GTAREK CRAVENS    We ask that GKEALII THUESONreturn to clinic in 1 months following MRI, or sooner as needed.  All questions were answered. The patient knows to call the clinic with any problems, questions or concerns. No barriers to learning were detected.  The total time spent in the encounter was 40 minutes and more than 50% was on counseling and review of test results   ZVentura Sellers MD Medical Director of Neuro-Oncology [facility] [place]11/17/23 10:23 AM

## 2022-09-09 NOTE — Progress Notes (Signed)
Patient states that he stopped taking the gabapentin due to right hand weakness

## 2022-09-14 ENCOUNTER — Telehealth: Payer: Self-pay

## 2022-09-14 ENCOUNTER — Other Ambulatory Visit (HOSPITAL_COMMUNITY): Payer: Self-pay

## 2022-09-14 DIAGNOSIS — C61 Malignant neoplasm of prostate: Secondary | ICD-10-CM

## 2022-09-14 NOTE — Telephone Encounter (Signed)
Oral Oncology Patient Advocate Encounter   Received notification that patient is due for re-enrollment for assistance for Nubeqa through [facility].   Re-enrollment process has been initiated and will be submitted upon completion of necessary documents.  [employer]'s phone number [phone].   I will continue to follow until final determination.  Berdine Addison, [facility] Patient [place]  [phone] (phone) [phone] (fax) 09/14/2022 11:19 AM

## 2022-09-17 ENCOUNTER — Ambulatory Visit
Admission: RE | Admit: 2022-09-17 | Discharge: 2022-09-17 | Disposition: A | Discharge status: Home / Self Care | Payer: Medicare HMO | Source: Ambulatory Visit | Attending: Internal Medicine | Admitting: Internal Medicine

## 2022-09-17 DIAGNOSIS — M5410 Radiculopathy, site unspecified: Secondary | ICD-10-CM | POA: Insufficient documentation

## 2022-09-17 MED ORDER — GADOBUTROL 1 MMOL/ML IV SOLN
6.0000 mL | Freq: Once | INTRAVENOUS | Status: AC | PRN
  Administered 2022-09-17: 7.5 mL via INTRAVENOUS

## 2022-09-19 MED ORDER — DAROLUTAMIDE 300 MG PO TABS: 600.0000 mg | ORAL_TABLET | Freq: Two times a day (BID) | ORAL | 2 refills | Status: DC

## 2022-09-19 NOTE — Telephone Encounter (Signed)
Prescription needed for 2024 re-enrollment escribed to [facility] pharmacy.

## 2022-09-21 NOTE — Telephone Encounter (Signed)
Patient will be in on Friday 12/01 to sign attestation form.   Berdine Addison, [facility] Patient [place]  [phone] (phone) [phone] (fax) 09/21/2022 9:53 AM

## 2022-09-23 ENCOUNTER — Other Ambulatory Visit: Payer: Self-pay | Admitting: Internal Medicine

## 2022-09-23 ENCOUNTER — Inpatient Hospital Stay: Payer: Medicare HMO | Attending: Internal Medicine | Admitting: Internal Medicine

## 2022-09-23 VITALS — BP 144/66 | HR 101 | Resp 18 | Wt 145.0 lb

## 2022-09-23 DIAGNOSIS — I10 Essential (primary) hypertension: Secondary | ICD-10-CM | POA: Diagnosis not present

## 2022-09-23 DIAGNOSIS — Z808 Family history of malignant neoplasm of other organs or systems: Secondary | ICD-10-CM | POA: Diagnosis not present

## 2022-09-23 DIAGNOSIS — M5412 Radiculopathy, cervical region: Secondary | ICD-10-CM

## 2022-09-23 DIAGNOSIS — M4722 Other spondylosis with radiculopathy, cervical region: Secondary | ICD-10-CM | POA: Diagnosis not present

## 2022-09-23 DIAGNOSIS — C61 Malignant neoplasm of prostate: Secondary | ICD-10-CM | POA: Diagnosis present

## 2022-09-23 DIAGNOSIS — R634 Abnormal weight loss: Secondary | ICD-10-CM | POA: Insufficient documentation

## 2022-09-23 DIAGNOSIS — C7951 Secondary malignant neoplasm of bone: Secondary | ICD-10-CM | POA: Diagnosis not present

## 2022-09-23 DIAGNOSIS — F17210 Nicotine dependence, cigarettes, uncomplicated: Secondary | ICD-10-CM | POA: Insufficient documentation

## 2022-09-23 DIAGNOSIS — N400 Enlarged prostate without lower urinary tract symptoms: Secondary | ICD-10-CM | POA: Diagnosis not present

## 2022-09-23 DIAGNOSIS — G629 Polyneuropathy, unspecified: Secondary | ICD-10-CM | POA: Diagnosis not present

## 2022-09-23 NOTE — Progress Notes (Signed)
Physical therapy never called patient

## 2022-09-23 NOTE — Progress Notes (Signed)
[facility] [place], [place] [address] [phone]   Interval Evaluation  Date of Service: 09/23/22 Patient Name: Eric [name] Patient MRN: [id number] Patient DOB: January 16, 1941 Provider: Ventura Sellers, MD  Identifying Statement:  Eric [name] is a 81 y.o. male with Cervical radiculopathy   Primary Cancer:  Oncologic History: Oncology History Overview Note  compatible with widespread metastatic prostate cancer, including innumerable sclerotic osseous lesions and extensive lymphadenopathy in the pelvis and retroperitoneum, as detailed above. 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 2. There is also an indeterminate hypovascular lesion in segment 5 of the liver which may represent a metastatic lesion. This could be better characterized with follow-up abdominal MRI with and without IV gadolinium if clinically appropriate. 3. Aortic atherosclerosis with mild fusiform aneurysmal dilatation of the infrarenal abdominal aorta measuring up to 3.2 x 3.0 cm. Recommend follow-up ultrasound every 3 years. This recommendation follows ACR consensus guidelines: White Paper of the ACR Incidental Findings Committee II on Vascular Findings. J Am Coll Radiol 2013; 10:789-794. 4. Bladder wall appears mildly thickened, likely related to bladder outlet obstruction given the enlarged prostate gland. 5. Severe colonic diverticulosis without evidence of acute diverticulitis at this time.  IMPRESSION: There are numerous foci of abnormal tracer uptake in the axial and proximal appendicular skeleton consistent with extensive skeletal metastatic disease.     #Stage IV castrate sensitive prostate cancer-December/20 2023-MRI negative for any liver lesions.; FEB 24th, 2022-Firmagon loading dose;  # march 24th-Taxoeter with  udenyca; 4/12- add darolutamide  # DEC 2022- 71 [[facility]; Urology]   Prostate cancer ([place])  12/13/2021 Initial Diagnosis   Prostate cancer ([place])   12/13/2021 Cancer Staging   Staging form: Prostate, AJCC 8th Edition - Clinical: Stage IVB (cT2c, cN1, cM1, PSA: 374, Grade Group: 5) - Signed by Cammie Sickle, MD on 12/13/2021 Prostate specific antigen (PSA) range: 20 or greater Histologic grading system: 5 grade system   01/12/2022 - 04/28/2022 Chemotherapy   Patient is on Treatment Plan : PROSTATE Docetaxel + Prednisone q21d      Genetic Testing   Negative genetic testing. No pathogenic variants identified on the [facility] Common Hereditary Cancers +RNA panel. VUS in BRIP1 called c.485G>T identified. The report date is 03/04/2022.  The Common Hereditary Cancers Panel + RNA offered by [facility] includes sequencing and/or deletion duplication testing of the following 47 genes: APC, ATM, AXIN2, BARD1, BMPR1A, BRCA1, BRCA2, BRIP1, CDH1, CDKN2A (p14ARF), CDKN2A (p16INK4a), CKD4, CHEK2, CTNNA1, DICER1, EPCAM (Deletion/duplication testing only), GREM1 (promoter region deletion/duplication testing only), KIT, MEN1, MLH1, MSH2, MSH3, MSH6, MUTYH, NBN, NF1, NHTL1, PALB2, PDGFRA, PMS2, POLD1, POLE, PTEN, RAD50, RAD51C, RAD51D, SDHB, SDHC, SDHD, SMAD4, SMARCA4. STK11, TP53, TSC1, TSC2, and VHL.  The following genes were evaluated for sequence changes only: SDHA and HOXB13 c.251G>A variant only.     Interval History: Eric [name] presents for follow up after cervical spine MRI study.  No new or progressive changes.  Right hand remains weak as prior. PT sessions have not started yet.  Still denies pain, changes in gait.    H+P (06/2022) Patient presents today to review right hand symptoms.  He describes several months history of mostly painless weakness, and also numbness.  Over the summer he started noticing some difficulty with his right hand, had hard time opening the gas cap on his car.  Some ADLs  were  limited by the weakness.  In recent weeks though, this has actually improved.  His "little fingers" are more affected than the thumb, index finger.  Numbness affects all the digits.  There is mild numbness in bottoms of his feet as well, no pain or weakness there.  MRI brain was done recently through Dr. Rogue Bussing.    Medications: Current Outpatient Medications on File Prior to Visit  Medication Sig Dispense Refill   acetaminophen (TYLENOL) 500 MG tablet Take 1 tablet (500 mg total) by mouth every 6 (six) hours as needed. 30 tablet 0   albuterol (VENTOLIN HFA) 108 (90 Base) MCG/ACT inhaler TAKE 2 PUFFS BY MOUTH EVERY 6 HOURS AS NEEDED FOR WHEEZE OR SHORTNESS OF BREATH 18 each 5   atorvastatin (LIPITOR) 40 MG tablet TAKE 1 TABLET (40 MG TOTAL) BY MOUTH DAILY. IN PLACE OF ROSUVASTATIN DUE TO CANER TREATMENT 90 tablet 1   Ca Carbonate-Mag Hydroxide (ROLAIDS PO) Take 1 tablet by mouth as needed.     darolutamide (NUBEQA) 300 MG tablet Take 2 tablets (600 mg total) by mouth 2 (two) times daily with a meal. 120 tablet 2   ezetimibe (ZETIA) 10 MG tablet Take 1 tablet (10 mg total) by mouth every morning. 90 tablet 1   Fluticasone-Umeclidin-Vilant (TRELEGY ELLIPTA) 100-62.5-25 MCG/ACT AEPB Inhale 1 puff into the lungs daily. 3 each 1   lidocaine-prilocaine (EMLA) cream Apply on the port. 30 -45 min  prior to port access. 30 g 3   lisinopril (ZESTRIL) 2.5 MG tablet Take by mouth.     metoprolol succinate (TOPROL-XL) 25 MG 24 hr tablet Take 1 tablet (25 mg total) by mouth daily. Take with or immediately following a meal. 90 tablet 1   tamsulosin (FLOMAX) 0.4 MG CAPS capsule TAKE 1 CAPSULE BY MOUTH EVERY DAY AFTER SUPPER 90 capsule 1   gabapentin (NEURONTIN) 100 MG capsule Take 1 pill at nighttime for 1 week.  If tolerating well take 2 pills at night. (Patient not taking: Reported on 08/10/2022) 60 capsule 0   ondansetron (ZOFRAN) 8 MG tablet One pill every 8 hours as needed for nausea/vomitting.  (Patient not taking: Reported on 08/10/2022) 40 tablet 1   prochlorperazine (COMPAZINE) 10 MG tablet Take 1 tablet (10 mg total) by mouth every 6 (six) hours as needed for nausea or vomiting. (Patient not taking: Reported on 08/10/2022) 40 tablet 1   No current facility-administered medications on file prior to visit.    Allergies: No Known Allergies Past Medical History:  Past Medical History:  Diagnosis Date   Cancer ([place])    COPD (chronic obstructive pulmonary disease) ([place])    Dyspnea    with exertion   Family history of cancer    GERD (gastroesophageal reflux disease)    occ   Hyperlipidemia    Hypertension    Past Surgical History:  Past Surgical History:  Procedure Laterality Date   COLONOSCOPY     HEMORROIDECTOMY     IR IMAGING GUIDED PORT INSERTION  12/20/2021   PROSTATE BIOPSY N/A 10/26/2021   Procedure: PROSTATE BIOPSY;  Surgeon: Abbie Sons, MD;  Location: [facility] ORS;  Service: Urology;  Laterality: N/A;   TRANSRECTAL ULTRASOUND N/A 10/26/2021   Procedure: TRANSRECTAL ULTRASOUND;  Surgeon: Abbie Sons, MD;  Location: [facility] ORS;  Service: Urology;  Laterality: N/A;   Social History:  Social History   Socioeconomic History   Marital status: Single    Spouse name: Not on file   Number of children: 1  Years of education: Not on file   Highest education level: High school graduate  Occupational History   Occupation: retired   Tobacco Use   Smoking status: Every Day    Packs/day: 0.50    Years: 60.00    Total pack years: 30.00    Types: Cigarettes   Smokeless tobacco: Never   Tobacco comments:    smoking cessation information provided  Vaping Use   Vaping Use: Never used  Substance and Sexual Activity   Alcohol use: Not Currently   Drug use: Not Currently   Sexual activity: Not Currently  Other Topics Concern   Not on file  Social History Narrative   Single, lives with male friend Abbott Pao is care giver] for the past 20 plus years. 1/2 ppd smoke; no  alcohol. 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 Daughter lives 10-15 mins; in [place].    Social Determinants of Health   Financial Resource Strain: Low Risk  (02/10/2022)   Overall Financial Resource Strain (CARDIA)    Difficulty of Paying Living Expenses: Not hard at all  Food Insecurity: No Food Insecurity (02/10/2022)   Hunger Vital Sign    Worried About Running Out of Food in the Last Year: Never true    Ran Out of Food in the Last Year: Never true  Transportation Needs: No Transportation Needs (02/10/2022)   PRAPARE - Hydrologist (Medical): No    Lack of Transportation (Non-Medical): No  Physical Activity: Inactive (02/10/2022)   Exercise Vital Sign    Days of Exercise per Week: 0 days    Minutes of Exercise per Session: 0 min  Stress: No Stress Concern Present (02/10/2022)   [place]    Feeling of Stress : Only a little  Social Connections: Socially Isolated (02/10/2022)   Social Connection and Isolation Panel [NHANES]    Frequency of Communication with Friends and Family: More than three times a week    Frequency of Social Gatherings with Friends and Family: Three times a week    Attends Religious Services: Never    Active Member of Clubs or Organizations: No    Attends Archivist Meetings: Never    Marital Status: Widowed  Intimate Partner Violence: Not At Risk (02/10/2022)   Humiliation, Afraid, Rape, and Kick questionnaire    Fear of Current or Ex-Partner: No    Emotionally Abused: No    Physically Abused: No    Sexually Abused: No   Family History:  Family History  Problem Relation Age of Onset   Heart disease Mother    COPD Father    Cancer Brother 24       blood cancer; unk exact type   Cancer Maternal Aunt        unk type   Cancer Paternal Aunt        unk type   Cancer Paternal Uncle        one unk type; one had throat   Cancer Cousin         unk types    Review of Systems: Constitutional: Doesn't report fevers, chills or abnormal weight loss Eyes: Doesn't report blurriness of vision Ears, nose, mouth, throat, and face: Doesn't report sore throat Respiratory: Doesn't report cough, dyspnea or wheezes Cardiovascular: Doesn't report palpitation, chest discomfort  Gastrointestinal:  Doesn't report nausea, constipation, diarrhea GU: Doesn't report incontinence Skin: Doesn't report skin rashes Neurological: Per HPI Musculoskeletal: Doesn't report  joint pain Behavioral/Psych: Doesn't report anxiety  Physical Exam: Vitals:   09/23/22 1130 09/23/22 1132  BP:  (!) 144/66  Pulse:  (!) 101  Resp: 18 18  SpO2:  100%   KPS: 80. General: Alert, cooperative, pleasant, in no acute distress Head: Normal EENT: No conjunctival injection or scleral icterus.  Lungs: Resp effort normal Cardiac: Regular rate Abdomen: Non-distended abdomen Skin: No rashes cyanosis or petechiae. Extremities: No clubbing or edema  Neurologic Exam: Mental Status: Awake, alert, attentive to examiner. Oriented to self and environment. Language is fluent with intact comprehension.  Cranial Nerves: Visual acuity is grossly normal. Visual fields are full. Extra-ocular movements intact. No ptosis. Face is symmetric Motor: Tone is normal.  Right hypothenar atrophy noted.  Weakness in right hand with 4th/5th digit extension, abduction to thumb. Reflexes are symmetric, no pathologic reflexes present.  Sensory: Intact to light touch Gait: Normal.   Labs: I have reviewed the data as listed    Component Value Date/Time   NA 137 08/10/2022 1301   NA 142 08/17/2015 0928   K 4.2 08/10/2022 1301   CL 106 08/10/2022 1301   CO2 25 08/10/2022 1301   GLUCOSE 98 08/10/2022 1301   BUN 25 (H) 08/10/2022 1301   BUN 17 08/17/2015 0928   CREATININE 1.07 08/10/2022 1301   CREATININE 1.06 09/30/2021 1119   CALCIUM 8.8 (L) 08/10/2022 1301   PROT 6.8 08/10/2022  1301   PROT 6.8 08/17/2015 0928   ALBUMIN 3.5 08/10/2022 1301   ALBUMIN 4.1 08/17/2015 0928   AST 15 08/10/2022 1301   ALT 9 08/10/2022 1301   ALKPHOS 297 (H) 08/10/2022 1301   BILITOT 0.4 08/10/2022 1301   BILITOT 0.4 08/17/2015 0928   GFRNONAA >60 08/10/2022 1301   GFRNONAA 50 (L) 03/03/2021 1050   GFRAA 59 (L) 03/03/2021 1050   Lab Results  Component Value Date   WBC 6.6 08/10/2022   NEUTROABS 4.1 08/10/2022   HGB 11.5 (L) 08/10/2022   HCT 35.0 (L) 08/10/2022   MCV 100.3 (H) 08/10/2022   PLT 276 08/10/2022    Imaging:  MR CERVICAL SPINE W WO CONTRAST  Result Date: 09/19/2022 CLINICAL DATA:  Brain/CNS neoplasm, assess treatment response weakness in both hands, loss of muscle mass in right hand, bilateral numbness/tingling, history of prostate cancer EXAM: MRI CERVICAL SPINE WITHOUT AND WITH CONTRAST TECHNIQUE: Multiplanar and multiecho pulse sequences of the cervical spine, to include the craniocervical junction and cervicothoracic junction, were obtained without and with intravenous contrast. CONTRAST:  7.39m GADAVIST GADOBUTROL 1 MMOL/ML IV SOLN COMPARISON:  None Available. FINDINGS: Alignment: Trace degenerative retrolisthesis at C3-C4 and anterolisthesis at C6-C7. Vertebrae: There are diffuse low T1 and T2 signal osseous metastases throughout the cervical spine, involving both the vertebrae and posterior elements. There is lesional and perilesional enhancement. Cord: Normal signal and morphology. Posterior Fossa, vertebral arteries, paraspinal tissues: Negative. Disc levels: The craniocervical junction is unremarkable. C2-C3: No significant spinal canal or neural foraminal narrowing. C3-C4: Trace retrolisthesis with minimal disc bulging, endplate spurring and bilateral facet arthropathy. Mild spinal canal narrowing. There is moderate left neural foraminal stenosis. C4-C5: Broad disc bulging, uncovertebral joint hypertrophy, and bilateral facet arthropathy. There is mild spinal canal  stenosis, moderate left and moderate-severe right neural foraminal stenosis. C5-C6: Disc height loss with uncovertebral joint hypertrophy, minimal disc bulging, and bilateral facet arthropathy. Mild spinal canal narrowing. There is moderate-severe bilateral neural foraminal stenosis, right greater than left. C6-C7: Disc height loss with uncovertebral joint hypertrophy, minimal disc bulging, and  bilateral facet arthropathy. No significant spinal canal stenosis. There is mild left and moderate right neural foraminal stenosis. C7-T1: Minimal disc bulging and uncovertebral joint hypertrophy with bilateral facet arthropathy. No significant spinal canal stenosis. There is moderate left and mild-to-moderate right neural foraminal stenosis. IMPRESSION: Diffuse osseous metastases in the cervical spine, as seen on recent PET-CT. No abnormal cord signal. Multilevel degenerative changes of the cervical spine, as summarized below: C3-C4: Mild spinal canal stenosis. Moderate left neural foraminal stenosis. C4-C5: Mild spinal canal stenosis. Moderate left and moderate-severe right neural foraminal stenosis. C5-C6: Mild spinal canal stenosis. Moderate-severe bilateral neural foraminal stenosis, right worse than left. C6-C7: Mild left and moderate right neural foraminal stenosis. No spinal canal stenosis. C7-T1: Moderate left and mild-to-moderate right neural foraminal stenosis. No spinal canal stenosis. Electronically Signed   By: Maurine Simmering M.D.   On: 09/19/2022 09:34     Assessment/Plan Cervical radiculopathy  SAMIL MECHAM is clinically stable today.  MRI cervical spine demonstrate moderate degenerative changes, but not localizing to lower cervical region (C8/T1) which would correspond to his symptoms.    We did discuss NCS/EMG study for further evaluation, but he elected to defer at this time.  No referral to neurosurgery warranted at this time.  We emphasized the importance of physical therapy in regaining  strength in the hand.  We appreciate the opportunity to participate in the care of Eric [name].    We ask that Eric [name] return to clinic as needed with new or progressive symptoms.   All questions were answered. The patient knows to call the clinic with any problems, questions or concerns. No barriers to learning were detected.  The total time spent in the encounter was 30 minutes and more than 50% was on counseling and review of test results   Ventura Sellers, MD Medical Director of Neuro-Oncology [facility] [place] 09/23/22 11:53 AM

## 2022-09-24 IMAGING — MR MR ABDOMEN WO/W CM
18 series · 48 of 48 positions shown · IV contrast (gadavist)
Comparison: CT abdomen 11/30/2021

CLINICAL DATA: Liver lesion, history of prostate cancer

EXAM:
MRI ABDOMEN WITHOUT AND WITH CONTRAST
TECHNIQUE: Multiplanar multisequence MR imaging of the abdomen was performed
both before and after the administration of intravenous contrast.
CONTRAST:  6mL GADAVIST GADOBUTROL 1 MMOL/ML IV SOLN

[Series 3: T2 · coronal · 6.0mm · 1.19mm/px · 2 of 30 slices shown (1 of 2)]
[im 1/30]
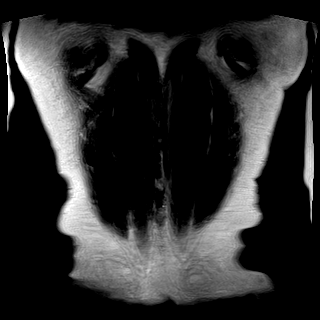
[im 30/30]
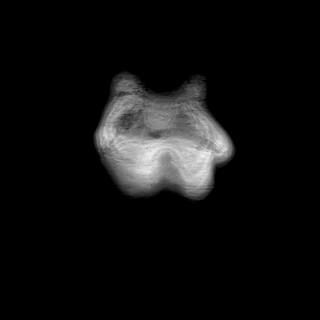

[Series 4: T2 · axial · 6.0mm · 1.19mm/px · z∈[-148,+75]mm · 2 of 32 slices shown (2 of 2)]
[im 1/32]
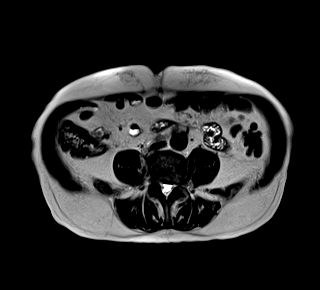
[im 32/32]
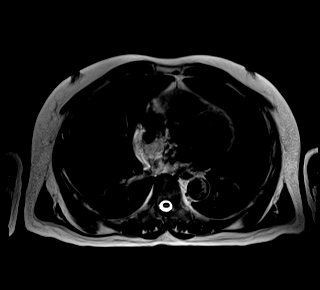

[Series 6: T2 fat-sat · axial · 6.0mm · 1.19mm/px · z∈[-138,+99]mm · 2 of 34 slices shown]
[im 1/34]
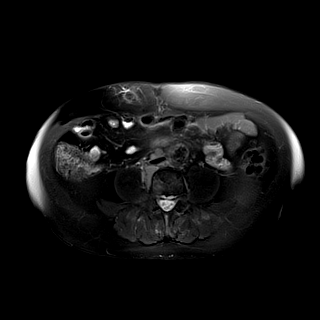
[im 34/34]
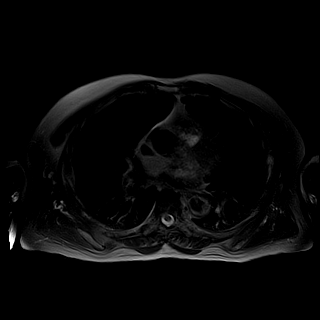

[Series 7: ax dwi_tracew · axial · 6.0mm · 1.42mm/px · z∈[-138,+99]mm · 4 of 102 slices shown]
[im 1/102]
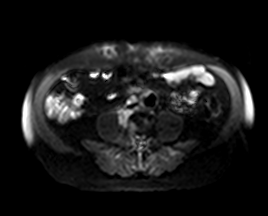
[im 34/102]
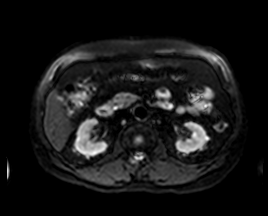
[im 68/102]
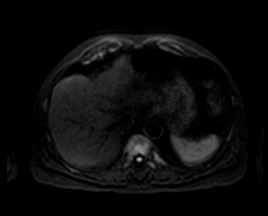
[im 102/102]
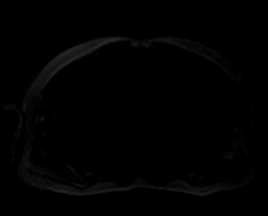

[Series 8: ax dwi_adc · axial · 6.0mm · 1.42mm/px · 1 of 34 slices shown]
[im 1/34]
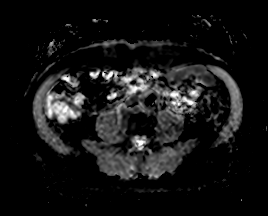

[Series 9: in & out · axial · 3.0mm · 1.19mm/px · z∈[-143,+70]mm · 3 of 72 slices shown (1 of 2)]
[im 1/72]
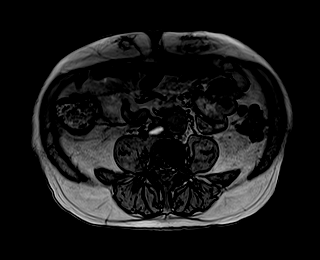
[im 36/72]
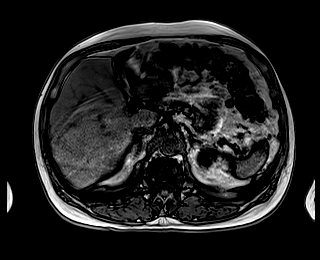
[im 72/72]
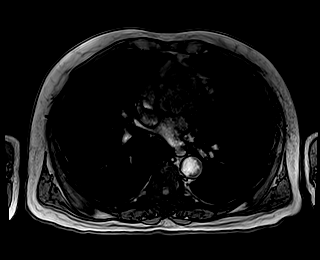

[Series 10: in & out · axial · 3.0mm · 1.19mm/px · z∈[-143,+70]mm · 3 of 72 slices shown (2 of 2)]
[im 1/72]
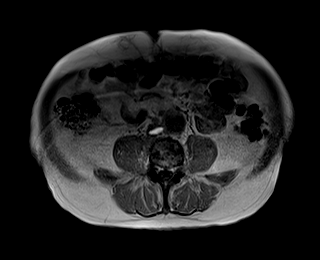
[im 36/72]
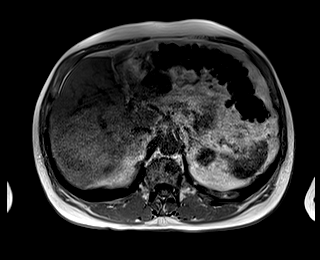
[im 72/72]
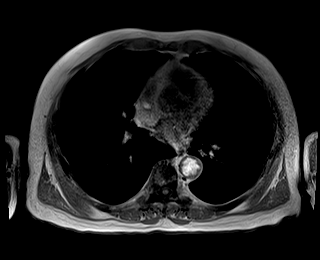

[Series 11: bSSFP · axial · 6.0mm · 0.74mm/px · 1 of 32 slices shown]
[im 1/32]
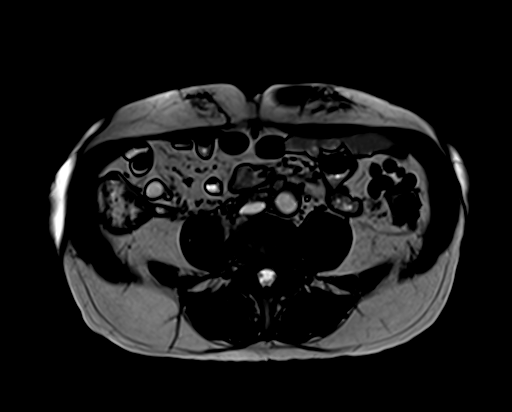

[Series 12: T1 dynamic fat-sat · axial · non-contrast · 3.0mm · 1.19mm/px · z∈[-155,+82]mm · 3 of 80 slices shown (1 of 5)]
[im 1/80]
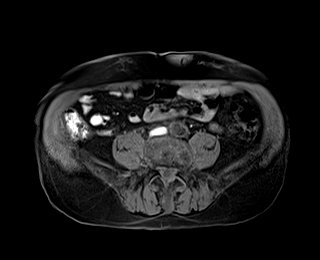
[im 40/80]
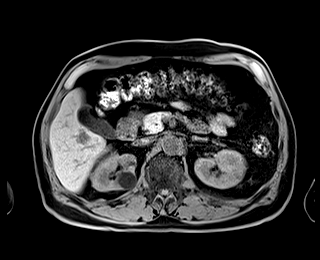
[im 80/80]
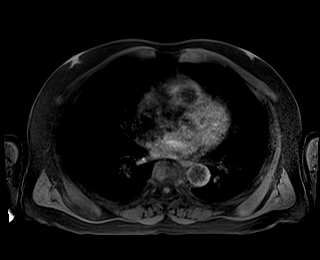

[Series 13: T1 dynamic fat-sat post-contrast · axial · 3.0mm · 1.19mm/px · z∈[-155,+82]mm · 3 of 80 slices shown (1 of 4)]
[im 1/80]
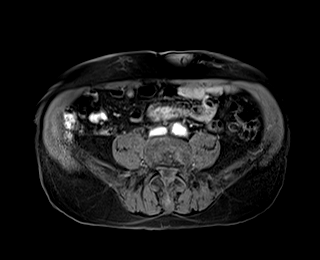
[im 40/80]
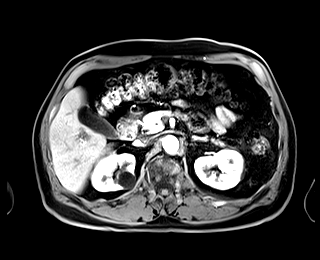
[im 80/80]
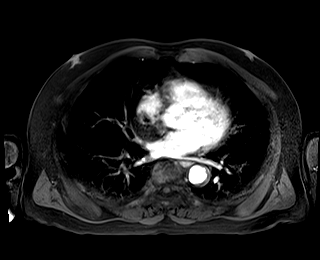

[Series 14: T1 dynamic fat-sat · axial · 3.0mm · 1.19mm/px · z∈[-155,+82]mm · 3 of 80 slices shown (2 of 5)]
[im 1/80]
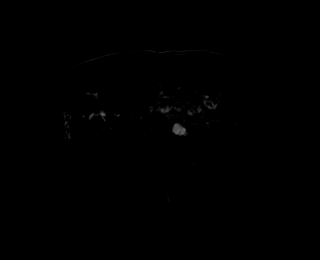
[im 40/80]
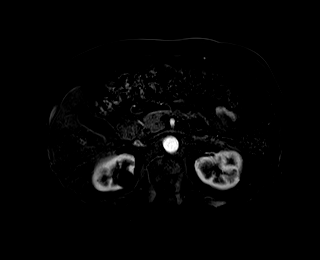
[im 80/80]
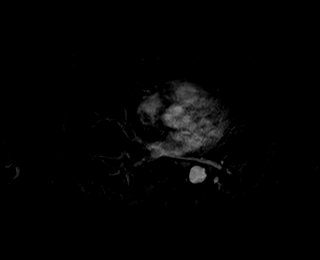

[Series 15: T1 dynamic fat-sat post-contrast · axial · 3.0mm · 1.19mm/px · z∈[-155,+82]mm · 3 of 80 slices shown (2 of 4)]
[im 1/80]
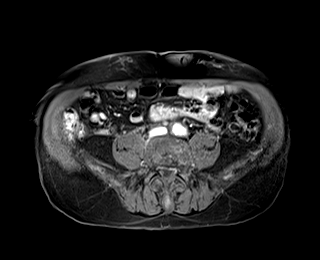
[im 40/80]
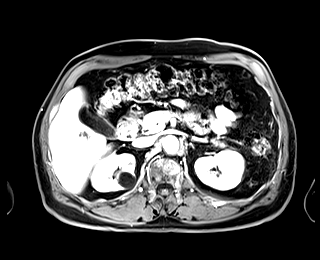
[im 80/80]
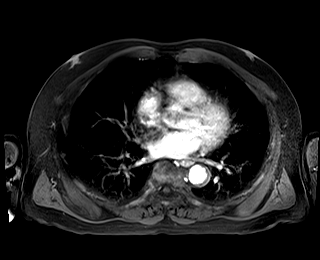

[Series 16: T1 dynamic fat-sat · axial · 3.0mm · 1.19mm/px · z∈[-155,+82]mm · 3 of 80 slices shown (3 of 5)]
[im 1/80]
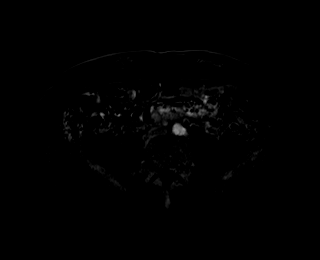
[im 40/80]
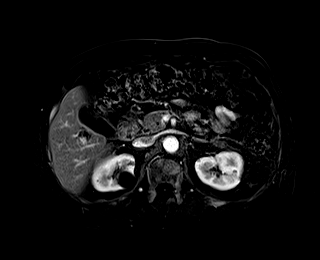
[im 80/80]
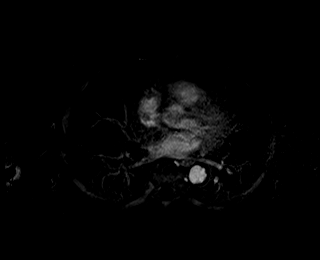

[Series 17: T1 dynamic fat-sat post-contrast · axial · 3.0mm · 1.19mm/px · z∈[-155,+82]mm · 3 of 80 slices shown (3 of 4)]
[im 1/80]
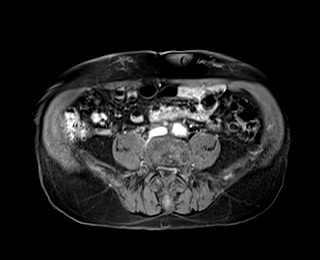
[im 40/80]
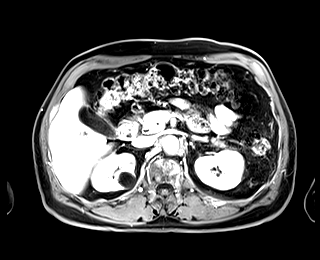
[im 80/80]
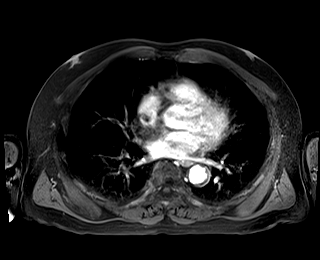

[Series 18: T1 dynamic fat-sat · axial · 3.0mm · 1.19mm/px · z∈[-155,+82]mm · 3 of 80 slices shown (4 of 5)]
[im 1/80]
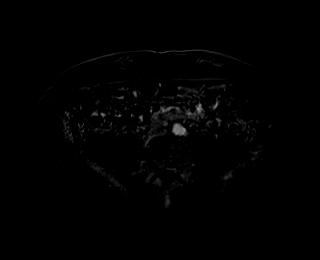
[im 40/80]
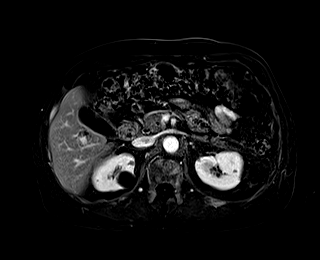
[im 80/80]
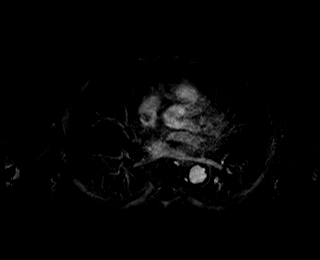

[Series 19: T1 dynamic post-contrast · coronal · 3.0mm · 1.31mm/px · 3 of 72 slices shown]
[im 1/72]
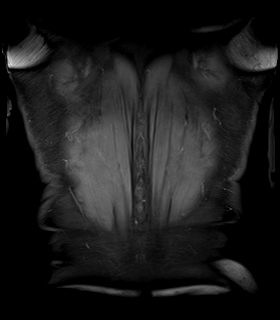
[im 36/72]
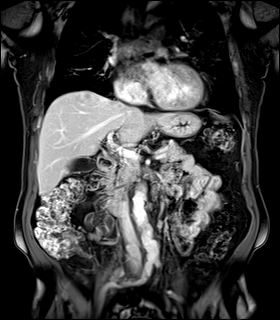
[im 72/72]
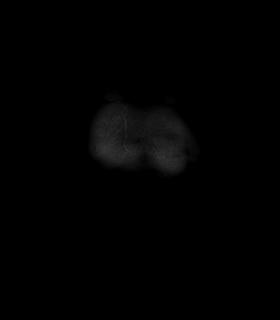

[Series 20: T1 dynamic fat-sat post-contrast · axial · 3.0mm · 1.19mm/px · z∈[-155,+82]mm · 3 of 80 slices shown (4 of 4)]
[im 1/80]
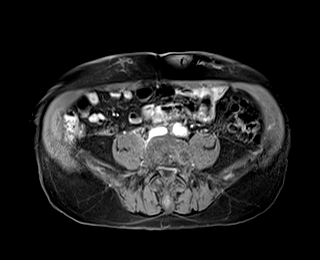
[im 40/80]
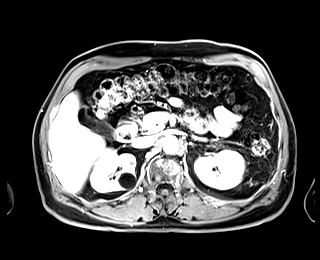
[im 80/80]
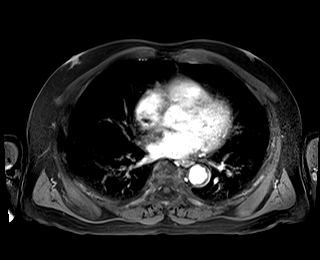

[Series 21: T1 dynamic fat-sat · axial · 3.0mm · 1.19mm/px · z∈[-155,+82]mm · 3 of 80 slices shown (5 of 5)]
[im 1/80]
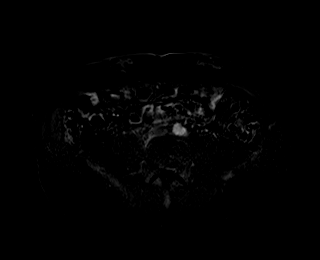
[im 40/80]
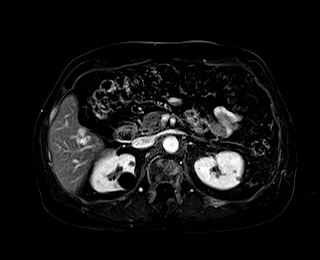
[im 80/80]
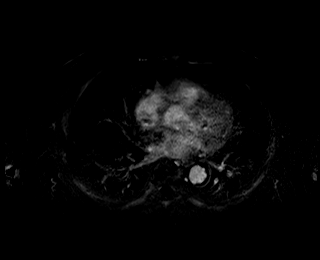

[48 of 48 positions shown; findings below may reference images not displayed]

FINDINGS: Lower chest: No acute process identified.

Hepatobiliary: Liver is normal in size and contour. 2.3 x 1.7 cm
lobulated hyperintense T2 signal mass in the inferior right hepatic
lobe segment 5 demonstrates early peripheral discontinuous
enhancement with gradual filling and is consistent with a
hemangioma. Also in segment [DATE], there is a 9 mm focus of early
hyperenhancement which is isointense to liver parenchyma by 90
seconds and does not demonstrate washout. No corresponding signal
abnormality visualized on other sequences, most likely a vascular
anomaly. No evidence of hepatic steatosis. Gallbladder appears
normal. No biliary ductal dilatation.

Pancreas: No mass, inflammatory changes, or other parenchymal
abnormality identified.

Spleen:  Within normal limits in size and appearance.

Adrenals/Urinary Tract: Adrenal glands appear normal. No
hydronephrosis or suspicious enhancing renal mass identified
bilaterally. There are multiple bilateral renal cortical cysts
measuring up to 2.8 cm in the upper left kidney.

Stomach/Bowel: Colonic diverticulosis. No evidence of bowel
obstruction.

Vascular/Lymphatic: Severe atherosclerotic disease. Extensive
retroperitoneal lymphadenopathy partially visualized with nodes
measuring up to 1.5 x 2.3 cm on the right.

Other:  No ascites.

Musculoskeletal: Extensive enhancing irregular bony lesions
consistent with metastases.
IMPRESSION: 1. No definitely suspicious hepatic mass identified. The previously
identified mass in the right hepatic lobe segment 5 is consistent
with a hemangioma. There is also a small focus of early enhancement
in the adjacent right hepatic lobe as described which most likely
represents a vascular anomaly rather than a mass.
2. Diffuse osseous metastatic disease.
3. Extensive retroperitoneal lymphadenopathy consistent with
metastases.
4. Colonic diverticulosis.

## 2022-09-28 ENCOUNTER — Inpatient Hospital Stay: Payer: Medicare HMO

## 2022-09-28 ENCOUNTER — Encounter: Payer: Self-pay | Admitting: Internal Medicine

## 2022-09-28 ENCOUNTER — Other Ambulatory Visit: Payer: Self-pay

## 2022-09-28 ENCOUNTER — Inpatient Hospital Stay (HOSPITAL_BASED_OUTPATIENT_CLINIC_OR_DEPARTMENT_OTHER): Payer: Medicare HMO | Admitting: Internal Medicine

## 2022-09-28 VITALS — BP 113/63 | HR 92 | Temp 97.9°F | Resp 16 | Wt 144.3 lb

## 2022-09-28 DIAGNOSIS — C61 Malignant neoplasm of prostate: Secondary | ICD-10-CM

## 2022-09-28 DIAGNOSIS — Z95828 Presence of other vascular implants and grafts: Secondary | ICD-10-CM

## 2022-09-28 DIAGNOSIS — C7951 Secondary malignant neoplasm of bone: Secondary | ICD-10-CM

## 2022-09-28 LAB — CBC WITH DIFFERENTIAL/PLATELET
Abs Immature Granulocytes: 0.05 10*3/uL (ref 0.00–0.07)
Basophils Absolute: 0 10*3/uL (ref 0.0–0.1)
Basophils Relative: 0 %
Eosinophils Absolute: 0.1 10*3/uL (ref 0.0–0.5)
Eosinophils Relative: 1 %
HCT: 33.7 % — ABNORMAL LOW (ref 39.0–52.0)
Hemoglobin: 11.2 g/dL — ABNORMAL LOW (ref 13.0–17.0)
Immature Granulocytes: 1 %
Lymphocytes Relative: 25 %
Lymphs Abs: 1.9 10*3/uL (ref 0.7–4.0)
MCH: 32.3 pg (ref 26.0–34.0)
MCHC: 33.2 g/dL (ref 30.0–36.0)
MCV: 97.1 fL (ref 80.0–100.0)
Monocytes Absolute: 0.5 10*3/uL (ref 0.1–1.0)
Monocytes Relative: 7 %
Neutro Abs: 5 10*3/uL (ref 1.7–7.7)
Neutrophils Relative %: 66 %
Platelets: 282 10*3/uL (ref 150–400)
RBC: 3.47 MIL/uL — ABNORMAL LOW (ref 4.22–5.81)
RDW: 14.8 % (ref 11.5–15.5)
WBC: 7.5 10*3/uL (ref 4.0–10.5)
nRBC: 0 % (ref 0.0–0.2)

## 2022-09-28 LAB — COMPREHENSIVE METABOLIC PANEL
ALT: 10 U/L (ref 0–44)
AST: 16 U/L (ref 15–41)
Albumin: 3.4 g/dL — ABNORMAL LOW (ref 3.5–5.0)
Alkaline Phosphatase: 598 U/L — ABNORMAL HIGH (ref 38–126)
Anion gap: 8 (ref 5–15)
BUN: 20 mg/dL (ref 8–23)
CO2: 21 mmol/L — ABNORMAL LOW (ref 22–32)
Calcium: 8.8 mg/dL — ABNORMAL LOW (ref 8.9–10.3)
Chloride: 106 mmol/L (ref 98–111)
Creatinine, Ser: 0.96 mg/dL (ref 0.61–1.24)
GFR, Estimated: >60 mL/min (ref >60)
Glucose, Bld: 116 mg/dL — ABNORMAL HIGH (ref 70–99)
Potassium: 4 mmol/L (ref 3.5–5.1)
Sodium: 135 mmol/L (ref 135–145)
Total Bilirubin: 0.5 mg/dL (ref 0.3–1.2)
Total Protein: 7 g/dL (ref 6.5–8.1)

## 2022-09-28 LAB — PSA: Prostatic Specific Antigen: 930 ng/mL — ABNORMAL HIGH (ref 0.00–4.00)

## 2022-09-28 MED ORDER — HEPARIN SOD (PORK) LOCK FLUSH 100 UNIT/ML IV SOLN
500.0000 [IU] | Freq: Once | INTRAVENOUS | Status: AC
  Administered 2022-09-28: 500 [IU] via INTRAVENOUS
  Filled 2022-09-28: qty 5

## 2022-09-28 MED ORDER — SODIUM CHLORIDE 0.9% FLUSH
10.0000 mL | Freq: Once | INTRAVENOUS | Status: AC
  Administered 2022-09-28: 10 mL via INTRAVENOUS
  Filled 2022-09-28: qty 10

## 2022-09-28 NOTE — Progress Notes (Signed)
[address] [place] OFFICE PROGRESS NOTE  Patient Care Team: Steele Sizer, MD as PCP - General (Family Medicine) Cammie Sickle, MD as Consulting Physician (Oncology) Abbie Sons, MD (Urology)   Cancer Staging  Prostate cancer [facility]) Staging form: Prostate, AJCC 8th Edition - Clinical: Stage IVB (cT2c, cN1, cM1, PSA: 374, Grade Group: 5) - Signed by Cammie Sickle, MD on 12/13/2021 Prostate specific antigen (PSA) range: 20 or greater Histologic grading system: 5 grade system    Oncology History Overview Note  compatible with widespread metastatic prostate cancer, including innumerable sclerotic osseous lesions and extensive lymphadenopathy in the pelvis and retroperitoneum, as detailed above. 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 2. There is also an indeterminate hypovascular lesion in segment 5 of the liver which may represent a metastatic lesion. This could be better characterized with follow-up abdominal MRI with and without IV gadolinium if clinically appropriate. 3. Aortic atherosclerosis with mild fusiform aneurysmal dilatation of the infrarenal abdominal aorta measuring up to 3.2 x 3.0 cm. Recommend follow-up ultrasound every 3 years. This recommendation follows ACR consensus guidelines: White Paper of the ACR Incidental Findings Committee II on Vascular Findings. J Am Coll Radiol 2013; 10:789-794. 4. Bladder wall appears mildly thickened, likely related to bladder outlet obstruction given the enlarged prostate gland. 5. Severe colonic diverticulosis without evidence of acute diverticulitis at this time.  IMPRESSION: There are numerous foci of abnormal tracer uptake in the axial and proximal appendicular skeleton consistent with extensive skeletal metastatic disease.     #Stage IV castrate sensitive prostate cancer-December/20  2023-MRI negative for any liver lesions.; FEB 24th, 2022-Firmagon loading dose;  # march 24th-Taxoeter with udenyca; 4/12- add darolutamide  # DEC 2022- 38 [[place]; Urology]   Prostate cancer ([place])  12/13/2021 Initial Diagnosis   Prostate cancer ([place])   12/13/2021 Cancer Staging   Staging form: Prostate, AJCC 8th Edition - Clinical: Stage IVB (cT2c, cN1, cM1, PSA: 374, Grade Group: 5) - Signed by Cammie Sickle, MD on 12/13/2021 Prostate specific antigen (PSA) range: 20 or greater Histologic grading system: 5 grade system   01/12/2022 - 04/28/2022 Chemotherapy   Patient is on Treatment Plan : PROSTATE Docetaxel + Prednisone q21d      Genetic Testing   Negative genetic testing. No pathogenic variants identified on the [facility] Common Hereditary Cancers +RNA panel. VUS in BRIP1 called c.485G>T identified. The report date is 03/04/2022.  The Common Hereditary Cancers Panel + RNA offered by [facility] includes sequencing and/or deletion duplication testing of the following 47 genes: APC, ATM, AXIN2, BARD1, BMPR1A, BRCA1, BRCA2, BRIP1, CDH1, CDKN2A (p14ARF), CDKN2A (p16INK4a), CKD4, CHEK2, CTNNA1, DICER1, EPCAM (Deletion/duplication testing only), GREM1 (promoter region deletion/duplication testing only), KIT, MEN1, MLH1, MSH2, MSH3, MSH6, MUTYH, NBN, NF1, NHTL1, PALB2, PDGFRA, PMS2, POLD1, POLE, PTEN, RAD50, RAD51C, RAD51D, SDHB, SDHC, SDHD, SMAD4, SMARCA4. STK11, TP53, TSC1, TSC2, and VHL.  The following genes were evaluated for sequence changes only: SDHA and HOXB13 c.251G>A variant only.      HISTORY OF PRESENT ILLNESS: Ambulating with a cane.  With daughter.    Eric [name] 81 y.o.  male pleasant patient above history of castrate resistant metastatic prostate cancer currently s/p  Taxotere chemotherapy x6 cycles; currently Eligard plus darolutamide is here for follow-up.  Patient noted to have progressive disease based on rising PSA/review results of the PSMA PET.  In the interim  has been evaluated by-neurology.  He also had MRI brain and also MRI cervical spine.  This patient's been recommended physical therapy for ongoing weakness/neuropathy of the right upper extremity.  Patient continues to complain of w mainly numbness in extremities.  Mild to moderate fatigue.  No nausea no vomiting.  No sores in the mouth.   Review of Systems  Constitutional:  Positive for malaise/fatigue and weight loss. Negative for chills, diaphoresis and fever.  HENT:  Negative for nosebleeds and sore throat.   Eyes:  Negative for double vision.  Respiratory:  Negative for cough, hemoptysis, sputum production, shortness of breath and wheezing.   Cardiovascular:  Negative for chest pain, palpitations, orthopnea and leg swelling.  Gastrointestinal:  Negative for abdominal pain, blood in stool, constipation, diarrhea, heartburn, melena, nausea and vomiting.  Genitourinary:  Negative for dysuria, frequency and urgency.  Musculoskeletal:  Positive for back pain and joint pain.  Skin: Negative.  Negative for itching and rash.  Neurological:  Negative for dizziness, tingling, focal weakness, weakness and headaches.  Endo/Heme/Allergies:  Does not bruise/bleed easily.  Psychiatric/Behavioral:  Negative for depression. The patient is not nervous/anxious and does not have insomnia.       PAST MEDICAL HISTORY :  Past Medical History:  Diagnosis Date   Cancer ([place])    COPD (chronic obstructive pulmonary disease) ([place])    Dyspnea    with exertion   Family history of cancer    GERD (gastroesophageal reflux disease)    occ   Hyperlipidemia    Hypertension     PAST SURGICAL HISTORY :   Past Surgical History:  Procedure Laterality Date   COLONOSCOPY     HEMORROIDECTOMY     IR IMAGING GUIDED PORT INSERTION  12/20/2021   PROSTATE BIOPSY N/A 10/26/2021   Procedure: PROSTATE BIOPSY;  Surgeon: Abbie Sons, MD;  Location: [facility] ORS;  Service: Urology;  Laterality: N/A;   TRANSRECTAL  ULTRASOUND N/A 10/26/2021   Procedure: TRANSRECTAL ULTRASOUND;  Surgeon: Abbie Sons, MD;  Location: [facility] ORS;  Service: Urology;  Laterality: N/A;    FAMILY HISTORY :   Family History  Problem Relation Age of Onset   Heart disease Mother    COPD Father    Cancer Brother 5       blood cancer; unk exact type   Cancer Maternal Aunt        unk type   Cancer Paternal Aunt        unk type   Cancer Paternal Uncle        one unk type; one had throat   Cancer Cousin        unk types    SOCIAL HISTORY:   Social History   Tobacco Use   Smoking status: Every Day    Packs/day: 0.50    Years: 60.00    Total pack years: 30.00    Types: Cigarettes   Smokeless tobacco: Never   Tobacco comments:    smoking cessation information provided  Vaping Use   Vaping Use: Never used  Substance Use Topics   Alcohol use: Not Currently   Drug use: Not Currently    ALLERGIES:  has No Known Allergies.  MEDICATIONS:  Current Outpatient Medications  Medication Sig Dispense Refill   acetaminophen (TYLENOL) 500 MG tablet Take 1 tablet (500 mg total) by mouth every 6 (six) hours as needed. 30 tablet 0   albuterol (VENTOLIN HFA) 108 (90 Base) MCG/ACT inhaler TAKE 2 PUFFS BY MOUTH EVERY 6 HOURS AS NEEDED FOR WHEEZE  OR SHORTNESS OF BREATH 18 each 5   Ca Carbonate-Mag Hydroxide (ROLAIDS PO) Take 1 tablet by mouth as needed.     darolutamide (NUBEQA) 300 MG tablet Take 2 tablets (600 mg total) by mouth 2 (two) times daily with a meal. 120 tablet 2   lidocaine-prilocaine (EMLA) cream Apply on the port. 30 -45 min  prior to port access. 30 g 3   atorvastatin (LIPITOR) 40 MG tablet Take 1 tablet (40 mg total) by mouth daily. In place of rosuvastatin due to caner treatment (Patient not taking: Reported on 08/10/2022) 90 tablet 1   ezetimibe (ZETIA) 10 MG tablet Take 1 tablet (10 mg total) by mouth every morning. (Patient not taking: Reported on 08/10/2022) 90 tablet 1   Fluticasone-Umeclidin-Vilant  (TRELEGY ELLIPTA) 100-62.5-25 MCG/ACT AEPB Inhale 1 puff into the lungs daily. (Patient not taking: Reported on 08/10/2022) 3 each 1   gabapentin (NEURONTIN) 100 MG capsule Take 1 pill at nighttime for 1 week.  If tolerating well take 2 pills at night. (Patient not taking: Reported on 08/10/2022) 60 capsule 0   metoprolol succinate (TOPROL-XL) 25 MG 24 hr tablet Take 1 tablet (25 mg total) by mouth daily. Take with or immediately following a meal. (Patient not taking: Reported on 08/10/2022) 90 tablet 1   ondansetron (ZOFRAN) 8 MG tablet One pill every 8 hours as needed for nausea/vomitting. (Patient not taking: Reported on 08/10/2022) 40 tablet 1   prochlorperazine (COMPAZINE) 10 MG tablet Take 1 tablet (10 mg total) by mouth every 6 (six) hours as needed for nausea or vomiting. (Patient not taking: Reported on 08/10/2022) 40 tablet 1   tamsulosin (FLOMAX) 0.4 MG CAPS capsule TAKE 1 CAPSULE BY MOUTH EVERY DAY AFTER SUPPER (Patient not taking: Reported on 08/10/2022) 90 capsule 1   No current facility-administered medications for this visit.    PHYSICAL EXAMINATION: ECOG PERFORMANCE STATUS: 1 - Symptomatic but completely ambulatory  BP 125/77   Pulse 92   Temp 97.7 F (36.5 C) (Tympanic)   Resp 16   Wt 150 lb (68 kg)   BMI 24.21 kg/m   Filed Weights   09/28/22 1400  Weight: 144 lb 4.8 oz (65.5 kg)   4-5 weakness noted right upper extremity grip strength.   Physical Exam Vitals and nursing note reviewed.  HENT:     Head: Normocephalic and atraumatic.     Mouth/Throat:     Pharynx: Oropharynx is clear.  Eyes:     Extraocular Movements: Extraocular movements intact.     Pupils: Pupils are equal, round, and reactive to light.  Cardiovascular:     Rate and Rhythm: Normal rate and regular rhythm.  Pulmonary:     Comments: Decreased breath sounds bilaterally.  Abdominal:     Palpations: Abdomen is soft.  Musculoskeletal:        General: Normal range of motion.     Cervical back:  Normal range of motion.  Skin:    General: Skin is warm.  Neurological:     General: No focal deficit present.     Mental Status: He is alert and oriented to person, place, and time.  Psychiatric:        Behavior: Behavior normal.        Judgment: Judgment normal.     LABORATORY DATA:  I have reviewed the data as listed    Component Value Date/Time   NA 137 08/10/2022 1301   NA 142 08/17/2015 0928   K 4.2 08/10/2022 1301   CL  106 08/10/2022 1301   CO2 25 08/10/2022 1301   GLUCOSE 98 08/10/2022 1301   BUN 25 (H) 08/10/2022 1301   BUN 17 08/17/2015 0928   CREATININE 1.07 08/10/2022 1301   CREATININE 1.06 09/30/2021 1119   CALCIUM 8.8 (L) 08/10/2022 1301   PROT 6.8 08/10/2022 1301   PROT 6.8 08/17/2015 0928   ALBUMIN 3.5 08/10/2022 1301   ALBUMIN 4.1 08/17/2015 0928   AST 15 08/10/2022 1301   ALT 9 08/10/2022 1301   ALKPHOS 297 (H) 08/10/2022 1301   BILITOT 0.4 08/10/2022 1301   BILITOT 0.4 08/17/2015 0928   GFRNONAA >60 08/10/2022 1301   GFRNONAA 50 (L) 03/03/2021 1050   GFRAA 59 (L) 03/03/2021 1050    No results found for: "SPEP", "UPEP"  Lab Results  Component Value Date   WBC 6.6 08/10/2022   NEUTROABS 4.1 08/10/2022   HGB 11.5 (L) 08/10/2022   HCT 35.0 (L) 08/10/2022   MCV 100.3 (H) 08/10/2022   PLT 276 08/10/2022      Chemistry      Component Value Date/Time   NA 137 08/10/2022 1301   NA 142 08/17/2015 0928   K 4.2 08/10/2022 1301   CL 106 08/10/2022 1301   CO2 25 08/10/2022 1301   BUN 25 (H) 08/10/2022 1301   BUN 17 08/17/2015 0928   CREATININE 1.07 08/10/2022 1301   CREATININE 1.06 09/30/2021 1119      Component Value Date/Time   CALCIUM 8.8 (L) 08/10/2022 1301   ALKPHOS 297 (H) 08/10/2022 1301   AST 15 08/10/2022 1301   ALT 9 08/10/2022 1301   BILITOT 0.4 08/10/2022 1301   BILITOT 0.4 08/17/2015 4742       RADIOGRAPHIC STUDIES: I have personally reviewed the radiological images as listed and agreed with the findings in the  report. No results found.   ASSESSMENT & PLAN:  Prostate cancer ([place]) #Stage IV-castrate sensitive prostate cancer with multiple bone metastases/extensive lymphadenopathy;  lymphadenopathy in the upper right hemipelvis which likely causes severe stenosis or complete occlusion of the right common iliac vein. MRI- liver-1st-March 2023 hemangioma.  Currently s/p cycle # 6 of Taxotere [60 mg per metered square]- finished JULY, 2023.   # Currently on  ADT-Eligard+  darolutamide today; 300 mg- 2 pills BID. Tolerating well; but unfortunately PSA rising.  I discussed my concerns for progressive disease noted by rising PSA.  Recommend PSMA PET scan.  Also discussed the use of   lutetium Lu-177 [four cycles every six weeks; responding patients could have an extra two cycles].  We will inform Dr. Nicole Kindred.   # PN- from taxotere- G-2-3; Right UE weakness-?  Stroke versus others.  Worsening in last 2-3 weeks-re-start baby asprin. Recommend MRI Brain with and without contrast especially given the progressive prostate cancer.  We will also make a referral to Dr. Mickeal Skinner.  # Prostatism symptoms: Continue Flomax; STABLE   # Weight loss/malnutrition-s/p evaluation with nutrition-  Improving/ STABLE   # IV access: functional refilled- emla cream.   # eligardq6M-last July 2023; [prefers 1-2 pm]  # DISPOSITION:   # refer to Dr.vaslow re: right UE weakness/ Peripheral neuropathy.  # PET scan ordered # MRI Brain STAT # follow up in 2-3 week MD; No labs-   - Dr.B   # 40 minutes face-to-face with the patient discussing the above plan of care; more than 50% of time spent on prognosis/ natural history; counseling and coordination.       Orders Placed This Encounter  Procedures  NM PET (PSMA) SKULL TO MID THIGH    Standing Status:   Future    Standing Expiration Date:   08/11/2023    Order Specific Question:   If indicated for the ordered procedure, I authorize the administration of a radiopharmaceutical per  Radiology protocol    Answer:   Yes    Order Specific Question:   Preferred imaging location?    Answer:   [facility]   MR BRAIN W WO CONTRAST    Standing Status:   Future    Standing Expiration Date:   08/11/2023    Order Specific Question:   If indicated for the ordered procedure, I authorize the administration of contrast media per Radiology protocol    Answer:   Yes    Order Specific Question:   What is the patient's sedation requirement?    Answer:   No Sedation    Order Specific Question:   Does the patient have a pacemaker or implanted devices?    Answer:   No    Order Specific Question:   Use SRS Protocol?    Answer:   No    Order Specific Question:   Preferred imaging location?    Answer:   [facility] (table limit-350lbs)   Amb Referral to Neuro Oncology    Referral Priority:   Routine    Referral Type:   Consultation    Referral Reason:   Specialty Services Required    Number of Visits Requested:   1   All questions were answered. The patient knows to call the clinic with any problems, questions or concerns.      Cammie Sickle, MD 09/28/2022 8:54 PM

## 2022-09-28 NOTE — Assessment & Plan Note (Addendum)
#  Stage IV-castrate resistant prostate cancer [OCT 2023] with multiple bone metastases/extensive lymphadenopathy; Currently s/p cycle # 6 of Taxotere [60 mg per metered square]- finished JULY, 2023. NOV 2023-PET scan shows multiple progressive bone lesions; shows no focal activity in the prostate/abdominal lymph nodes.  PSA rising-concerning for progressive disease.  # Currently on  ADT-Eligard+  darolutamide today; 300 mg- 2 pills BID. Tolerating well; but unfortunately PSA rising.  Given the progressive disease discussed regarding use of Pluvicto.  Order placed.  Informed Dr. Nicole Kindred.  # PN- from taxotere- G-2-3; Right UE weakness- S/p evaluation to Dr. Mickeal Skinner- MRI cervical spine demonstrate moderate degenerative changes, but not localizing to lower cervical region (C8/T1) which would correspond to his symptoms.  Awaiting physical therapy.  I discussed the role of Zometa to decrease skeletal related events [pain; fractures; need for radiation; hypercalcemia].  Discussed the potential side effects including but not limited to-infusion reaction; hypocalcemia; and Osteo-necrosis of jaw. Recommend ca+Vit D BID.   # Prostatism symptoms: Continue Flomax; STABLE   # Weight loss/malnutrition-s/p evaluation with nutrition-  Improving/ STABLE   # IV access: functional refilled-port flush every 2 to 3 months.  emla cream.   # Vaccination: s/p flu shot; reocmmend COVID; RSV   # eligard q 35M-last July 2023; [prefers 1-2 pm]  # DISPOSITION:   # follow up in 6 week MD; labs-/port- cbc/cmp; vit D 25 OH levals. PSA; Eligard; Zometa- - Dr.B

## 2022-09-28 NOTE — Progress Notes (Signed)
Stable SOBr on exertion.  Has appt with Dr. Sherrilee Gilles for neuropathy this Friday.

## 2022-09-29 ENCOUNTER — Telehealth: Payer: Self-pay

## 2022-09-29 NOTE — Telephone Encounter (Signed)
Dr. B would like patient to be receive  Pluvicto tx-currently status post Taxotere with progressive disease.  Message has been sent to Dr. Leonia Reeves [facility] radiologist), Oliver Barre [facility] Nuc med dept).  AAA patient connect form has been filled out, signed by MD, and pending patient signature.  Patient's daughter, [name], will pick up the form from the CC and return once patient has signed, hopefully the next day.  Once form has been returned to the office with patient signature will forward to Oliver Barre along with completed Pluvicto therapy ordering form.   Checking with ins auth dept at [facility] CC regarding auth for Pluvicto tx.

## 2022-10-04 NOTE — Telephone Encounter (Signed)
Patient signed AAA form and was returned to the office.

## 2022-10-04 NOTE — Telephone Encounter (Signed)
Completed AAA form and Pluvicto therapy ordering form has been sent to [facility].  Per [facility] CC PA depart the PA team has completed the PA process.Marland Kitchen

## 2022-10-05 ENCOUNTER — Encounter: Payer: Self-pay | Admitting: Internal Medicine

## 2022-10-05 NOTE — Telephone Encounter (Signed)
Patient scheduled to start treatments on 11/17/22 '@11am'$ .

## 2022-10-11 ENCOUNTER — Other Ambulatory Visit: Payer: Self-pay | Admitting: Internal Medicine

## 2022-10-11 DIAGNOSIS — C61 Malignant neoplasm of prostate: Secondary | ICD-10-CM

## 2022-10-12 ENCOUNTER — Telehealth: Payer: Self-pay

## 2022-10-12 NOTE — Telephone Encounter (Signed)
Please schedule as requested.

## 2022-10-12 NOTE — Telephone Encounter (Signed)
Patient will be receiving Pluvicto treatments at [facility].  Message received from [facility] with [facility] nuc med requesting [place] to coordinate Mr. Maves's labs (CBC,CMP, PSA) prior to each Pluvicto treatment.   He will need labs done the following weeks (within days of the listed date is okay): 11/07/22 12/19/22 01/30/23 03/13/23 04/24/23 06/05/23

## 2022-10-13 ENCOUNTER — Encounter: Payer: Self-pay | Admitting: Internal Medicine

## 2022-10-25 ENCOUNTER — Telehealth: Payer: Self-pay | Admitting: *Deleted

## 2022-10-25 DIAGNOSIS — C61 Malignant neoplasm of prostate: Secondary | ICD-10-CM

## 2022-10-25 NOTE — Telephone Encounter (Signed)
Daughter called and left message that pharmacy told her that we needs to do something on our end so that patient can get his cancer pills.

## 2022-10-26 NOTE — Telephone Encounter (Signed)
Oral Oncology Patient Advocate Encounter   Submitted application for assistance for Nubeqa to [employer].   Application submitted via e-fax to [phone]   [employer]'s phone number [phone].   I will continue to check the status until final determination.   Eric [name], [place] [facility] Patient [place]  [phone] (phone) [phone] (fax) 10/26/2022 9:45 AM

## 2022-10-27 ENCOUNTER — Encounter: Payer: Self-pay | Admitting: Internal Medicine

## 2022-10-28 ENCOUNTER — Telehealth: Payer: Self-pay | Admitting: Pharmacist

## 2022-10-28 NOTE — Telephone Encounter (Signed)
Oral Chemotherapy Pharmacist Encounter   Received phone call from patient's daughter Rhondie upset because she thought she would be receiving a status update on her father's mfg application from [place]. Suezanne Jacquet is out of the office today, so I am unable to speak to their last conversation.   I did let her know that the form she came in to sign this week for her father was sent to the assistance program and it could take a few weeks for them to process the paperwork.  Of note, Suezanne Jacquet had contacted the patient/caregiver on 09/21/22 about needing signatures and they were to come in on 09/23/22 to sign the paperwork. The delay in signing it now delaying the approval.   All of the above was discussed with Rhondie, who remained upset. Unfortunately there is no prostate assistance open at this time, so we will have to wait out the mfg application process. Apologized to patient if she was expecting an update and did not receive one, but she does now understand we are waiting on the program and signing the paperwork does not result in instant re-approval.   Of note, patient is scheduled to begin Pluvicto on 11/17/22.  Darl Pikes, PharmD, BCPS, BCOP, CPP Hematology/Oncology Clinical Pharmacist [place]/DB/AP Oral [facility] [phone]  10/28/2022 11:48 AM

## 2022-10-31 ENCOUNTER — Other Ambulatory Visit (HOSPITAL_COMMUNITY): Payer: Self-pay

## 2022-10-31 NOTE — Telephone Encounter (Signed)
Received notification from MD that regimen will be changing due to disease progression. Cancelling re-enrollment and patient has been notified.   Berdine Addison, [place] [facility] Patient [place]  [phone] (phone) [phone] (fax) 10/31/2022 9:13 AM

## 2022-10-31 NOTE — Telephone Encounter (Signed)
Received notification from [employer] that new enrollment needed to be filled out and submitted. [name], patient's daughter, will be in around 31 today 68.40 to sign application. I will continue to follow and update until final determination.   Berdine Addison, [place] [facility] Patient [place]  [phone] (phone) [phone] (fax) 10/31/2022 8:48 AM

## 2022-11-07 ENCOUNTER — Other Ambulatory Visit: Payer: Medicare HMO

## 2022-11-09 ENCOUNTER — Encounter: Payer: Self-pay | Admitting: Internal Medicine

## 2022-11-09 ENCOUNTER — Inpatient Hospital Stay: Payer: Medicare HMO

## 2022-11-09 ENCOUNTER — Inpatient Hospital Stay (HOSPITAL_BASED_OUTPATIENT_CLINIC_OR_DEPARTMENT_OTHER): Payer: Medicare HMO | Admitting: Internal Medicine

## 2022-11-09 ENCOUNTER — Inpatient Hospital Stay: Payer: Medicare HMO | Attending: Internal Medicine

## 2022-11-09 VITALS — BP 113/69 | Temp 97.1°F | Resp 108 | Wt 140.3 lb

## 2022-11-09 VITALS — HR 97 | Resp 18

## 2022-11-09 DIAGNOSIS — Z7983 Long term (current) use of bisphosphonates: Secondary | ICD-10-CM | POA: Insufficient documentation

## 2022-11-09 DIAGNOSIS — I10 Essential (primary) hypertension: Secondary | ICD-10-CM | POA: Insufficient documentation

## 2022-11-09 DIAGNOSIS — F17210 Nicotine dependence, cigarettes, uncomplicated: Secondary | ICD-10-CM | POA: Insufficient documentation

## 2022-11-09 DIAGNOSIS — C61 Malignant neoplasm of prostate: Secondary | ICD-10-CM | POA: Insufficient documentation

## 2022-11-09 DIAGNOSIS — C7951 Secondary malignant neoplasm of bone: Secondary | ICD-10-CM | POA: Diagnosis not present

## 2022-11-09 DIAGNOSIS — Z5111 Encounter for antineoplastic chemotherapy: Secondary | ICD-10-CM | POA: Diagnosis present

## 2022-11-09 LAB — CBC WITH DIFFERENTIAL/PLATELET
Abs Immature Granulocytes: 0.18 10*3/uL — ABNORMAL HIGH (ref 0.00–0.07)
Basophils Absolute: 0 10*3/uL (ref 0.0–0.1)
Basophils Relative: 0 %
Eosinophils Absolute: 0 10*3/uL (ref 0.0–0.5)
Eosinophils Relative: 1 %
HCT: 27.8 % — ABNORMAL LOW (ref 39.0–52.0)
Hemoglobin: 9.1 g/dL — ABNORMAL LOW (ref 13.0–17.0)
Immature Granulocytes: 2 %
Lymphocytes Relative: 25 %
Lymphs Abs: 1.9 10*3/uL (ref 0.7–4.0)
MCH: 32.4 pg (ref 26.0–34.0)
MCHC: 32.7 g/dL (ref 30.0–36.0)
MCV: 98.9 fL (ref 80.0–100.0)
Monocytes Absolute: 0.6 10*3/uL (ref 0.1–1.0)
Monocytes Relative: 7 %
Neutro Abs: 4.8 10*3/uL (ref 1.7–7.7)
Neutrophils Relative %: 65 %
Platelets: 223 10*3/uL (ref 150–400)
RBC: 2.81 MIL/uL — ABNORMAL LOW (ref 4.22–5.81)
RDW: 15.6 % — ABNORMAL HIGH (ref 11.5–15.5)
WBC: 7.5 10*3/uL (ref 4.0–10.5)
nRBC: 0.4 % — ABNORMAL HIGH (ref 0.0–0.2)

## 2022-11-09 LAB — COMPREHENSIVE METABOLIC PANEL
ALT: 10 U/L (ref 0–44)
AST: 27 U/L (ref 15–41)
Albumin: 3.2 g/dL — ABNORMAL LOW (ref 3.5–5.0)
Alkaline Phosphatase: 844 U/L — ABNORMAL HIGH (ref 38–126)
Anion gap: 7 (ref 5–15)
BUN: 25 mg/dL — ABNORMAL HIGH (ref 8–23)
CO2: 22 mmol/L (ref 22–32)
Calcium: 8.7 mg/dL — ABNORMAL LOW (ref 8.9–10.3)
Chloride: 107 mmol/L (ref 98–111)
Creatinine, Ser: 1.06 mg/dL (ref 0.61–1.24)
GFR, Estimated: >60 mL/min (ref >60)
Glucose, Bld: 113 mg/dL — ABNORMAL HIGH (ref 70–99)
Potassium: 4.3 mmol/L (ref 3.5–5.1)
Sodium: 136 mmol/L (ref 135–145)
Total Bilirubin: 0.3 mg/dL (ref 0.3–1.2)
Total Protein: 6.9 g/dL (ref 6.5–8.1)

## 2022-11-09 LAB — VITAMIN D 25 HYDROXY (VIT D DEFICIENCY, FRACTURES): Vit D, 25-Hydroxy: 41.63 ng/mL (ref 30–100)

## 2022-11-09 LAB — PSA: Prostatic Specific Antigen: >1500 ng/mL — ABNORMAL HIGH (ref 0.00–4.00)

## 2022-11-09 MED ORDER — HEPARIN SOD (PORK) LOCK FLUSH 100 UNIT/ML IV SOLN
500.0000 [IU] | Freq: Once | INTRAVENOUS | Status: AC | PRN
  Administered 2022-11-09: 500 [IU]
  Filled 2022-11-09: qty 5

## 2022-11-09 MED ORDER — SODIUM CHLORIDE 0.9 % IV SOLN
Freq: Once | INTRAVENOUS | Status: AC
  Filled 2022-11-09: qty 250

## 2022-11-09 MED ORDER — SODIUM CHLORIDE 0.9% FLUSH
10.0000 mL | Freq: Once | INTRAVENOUS | Status: AC | PRN
  Administered 2022-11-09: 10 mL
  Filled 2022-11-09: qty 10

## 2022-11-09 MED ORDER — LEUPROLIDE ACETATE (6 MONTH) 45 MG ~~LOC~~ KIT
45.0000 mg | PACK | Freq: Once | SUBCUTANEOUS | Status: AC
  Administered 2022-11-09: 45 mg via SUBCUTANEOUS
  Filled 2022-11-09: qty 45

## 2022-11-09 MED ORDER — ZOLEDRONIC ACID 4 MG/100ML IV SOLN
4.0000 mg | Freq: Once | INTRAVENOUS | Status: AC
  Administered 2022-11-09: 4 mg via INTRAVENOUS
  Filled 2022-11-09: qty 100

## 2022-11-09 NOTE — Progress Notes (Signed)
Patient feels he has increase in SOBr today.  Decrease in appetite with 4 lb wt loss.  Scheduled for first [place] next Thursday.

## 2022-11-09 NOTE — Assessment & Plan Note (Addendum)
#  Stage IV-castrate resistant prostate cancer [OCT 2023] with multiple bone metastases/extensive lymphadenopathy; Currently s/p cycle # 6 of Taxotere [60 mg per metered square]- finished JULY, 2023. NOV 2023-PET scan shows multiple progressive bone lesions; shows no focal activity in the prostate/abdominal lymph nodes.  PSA rising-concerning for progressive disease.  # Currently on  ADT-Eligard; iscontinued darolutamide sec to progression.  Given the progressive disease discussed regarding use of Pluvicto.  Awaiting evaluation/Pluvicto with Dr.Stewart on 1/25.   # PN- from taxotere- G-2-3; Right UE weakness- S/p evaluation to Dr. Mickeal Skinner- MRI cervical spine demonstrate moderate degenerative changes, but not localizing to lower cervical region (C8/T1) which would correspond to his symptoms.  Declined physical therapy.  I discussed the role of Zometa to decrease skeletal related events [pain; fractures; need for radiation; hypercalcemia].  Discussed the potential side effects including but not limited to-infusion reaction; hypocalcemia; and Osteo-necrosis of jaw. Continue  ca+Vit D BID. Corrected calcium is 9.3  # Moderate anemia hemoglobin 9-likely secondary to underlying prostate cancer/chronic disease.  Check iron studies.   # Prostatism symptoms: Continue Flomax; stable.   # Weight loss/malnutrition-s/p evaluation with nutrition-  Improving- stable.   # IV access: functional refilled-port flush every 2 to 3 months.  emla cream.   # Vaccination: s/p flu shot; reocmmend COVID; RSV   # eligard q 41M-last July 2023; [prefers 1-2 pm]  # DISPOSITION:   #  Eligard ;Zometa  # follow up in 1 month MD; labs-/port- cbc/cmp; PSA- add iron studies; ferritin- - Dr.B

## 2022-11-09 NOTE — Progress Notes (Signed)
[address] [place] OFFICE PROGRESS NOTE  Patient Care Team: Steele Sizer, MD as PCP - General (Family Medicine) Cammie Sickle, MD as Consulting Physician (Oncology) Abbie Sons, MD (Urology)   Cancer Staging  Prostate cancer [facility]) Staging form: Prostate, AJCC 8th Edition - Clinical: Stage IVB (cT2c, cN1, cM1, PSA: 374, Grade Group: 5) - Signed by Cammie Sickle, MD on 12/13/2021 Prostate specific antigen (PSA) range: 20 or greater Histologic grading system: 5 grade system    Oncology History Overview Note  compatible with widespread metastatic prostate cancer, including innumerable sclerotic osseous lesions and extensive lymphadenopathy in the pelvis and retroperitoneum, as detailed above. 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 2. There is also an indeterminate hypovascular lesion in segment 5 of the liver which may represent a metastatic lesion. This could be better characterized with follow-up abdominal MRI with and without IV gadolinium if clinically appropriate. 3. Aortic atherosclerosis with mild fusiform aneurysmal dilatation of the infrarenal abdominal aorta measuring up to 3.2 x 3.0 cm. Recommend follow-up ultrasound every 3 years. This recommendation follows ACR consensus guidelines: White Paper of the ACR Incidental Findings Committee II on Vascular Findings. J Am Coll Radiol 2013; 10:789-794. 4. Bladder wall appears mildly thickened, likely related to bladder outlet obstruction given the enlarged prostate gland. 5. Severe colonic diverticulosis without evidence of acute diverticulitis at this time.  IMPRESSION: There are numerous foci of abnormal tracer uptake in the axial and proximal appendicular skeleton consistent with extensive skeletal metastatic disease.     #Stage IV castrate sensitive prostate cancer-December/20  2023-MRI negative for any liver lesions.; FEB 24th, 2022-Firmagon loading dose;  # march 24th-Taxoeter with udenyca; 4/12- add darolutamide  # DEC 2022- 56 [[place]; Urology]   Prostate cancer ([place])  12/13/2021 Initial Diagnosis   Prostate cancer ([place])   12/13/2021 Cancer Staging   Staging form: Prostate, AJCC 8th Edition - Clinical: Stage IVB (cT2c, cN1, cM1, PSA: 374, Grade Group: 5) - Signed by Cammie Sickle, MD on 12/13/2021 Prostate specific antigen (PSA) range: 20 or greater Histologic grading system: 5 grade system   01/12/2022 - 04/28/2022 Chemotherapy   Patient is on Treatment Plan : PROSTATE Docetaxel + Prednisone q21d      Genetic Testing   Negative genetic testing. No pathogenic variants identified on the [facility] Common Hereditary Cancers +RNA panel. VUS in BRIP1 called c.485G>T identified. The report date is 03/04/2022.  The Common Hereditary Cancers Panel + RNA offered by [facility] includes sequencing and/or deletion duplication testing of the following 47 genes: APC, ATM, AXIN2, BARD1, BMPR1A, BRCA1, BRCA2, BRIP1, CDH1, CDKN2A (p14ARF), CDKN2A (p16INK4a), CKD4, CHEK2, CTNNA1, DICER1, EPCAM (Deletion/duplication testing only), GREM1 (promoter region deletion/duplication testing only), KIT, MEN1, MLH1, MSH2, MSH3, MSH6, MUTYH, NBN, NF1, NHTL1, PALB2, PDGFRA, PMS2, POLD1, POLE, PTEN, RAD50, RAD51C, RAD51D, SDHB, SDHC, SDHD, SMAD4, SMARCA4. STK11, TP53, TSC1, TSC2, and VHL.  The following genes were evaluated for sequence changes only: SDHA and HOXB13 c.251G>A variant only.      HISTORY OF PRESENT ILLNESS: Ambulating with a cane.  With daughter.    Eric [name] 82 y.o.  male pleasant patient above history of castrate resistant metastatic prostate cancer currently s/p  Taxotere chemotherapy x6 cycles; currently Eligard plus darolutamide is here for follow-up.    Patient is currently awaiting evaluation at [facility] for lutetium treatment for his prostate cancer.   Appointment next week.  Patient feels  he has increase in SOBr today.  Decrease in appetite with 4 lb wt loss.   Continues to have chronic mild tingling and numbness in the right upper extremity.  Did not get physical therapy.  Mild to moderate fatigue.  No nausea no vomiting.  No sores in the mouth.   Review of Systems  Constitutional:  Positive for malaise/fatigue and weight loss. Negative for chills, diaphoresis and fever.  HENT:  Negative for nosebleeds and sore throat.   Eyes:  Negative for double vision.  Respiratory:  Positive for shortness of breath. Negative for cough, hemoptysis, sputum production and wheezing.   Cardiovascular:  Negative for chest pain, palpitations, orthopnea and leg swelling.  Gastrointestinal:  Negative for abdominal pain, blood in stool, constipation, diarrhea, heartburn, melena, nausea and vomiting.  Genitourinary:  Negative for dysuria, frequency and urgency.  Musculoskeletal:  Positive for back pain and joint pain.  Skin: Negative.  Negative for itching and rash.  Neurological:  Negative for dizziness, tingling, focal weakness, weakness and headaches.  Endo/Heme/Allergies:  Does not bruise/bleed easily.  Psychiatric/Behavioral:  Negative for depression. The patient is not nervous/anxious and does not have insomnia.       PAST MEDICAL HISTORY :  Past Medical History:  Diagnosis Date   Cancer ([place])    COPD (chronic obstructive pulmonary disease) ([place])    Dyspnea    with exertion   Family history of cancer    GERD (gastroesophageal reflux disease)    occ   Hyperlipidemia    Hypertension     PAST SURGICAL HISTORY :   Past Surgical History:  Procedure Laterality Date   COLONOSCOPY     HEMORROIDECTOMY     IR IMAGING GUIDED PORT INSERTION  12/20/2021   PROSTATE BIOPSY N/A 10/26/2021   Procedure: PROSTATE BIOPSY;  Surgeon: Abbie Sons, MD;  Location: [facility] ORS;  Service: Urology;  Laterality: N/A;   TRANSRECTAL ULTRASOUND N/A 10/26/2021    Procedure: TRANSRECTAL ULTRASOUND;  Surgeon: Abbie Sons, MD;  Location: [facility] ORS;  Service: Urology;  Laterality: N/A;    FAMILY HISTORY :   Family History  Problem Relation Age of Onset   Heart disease Mother    COPD Father    Cancer Brother 10       blood cancer; unk exact type   Cancer Maternal Aunt        unk type   Cancer Paternal Aunt        unk type   Cancer Paternal Uncle        one unk type; one had throat   Cancer Cousin        unk types    SOCIAL HISTORY:   Social History   Tobacco Use   Smoking status: Every Day    Packs/day: 0.50    Years: 60.00    Total pack years: 30.00    Types: Cigarettes   Smokeless tobacco: Never   Tobacco comments:    smoking cessation information provided  Vaping Use   Vaping Use: Never used  Substance Use Topics   Alcohol use: Not Currently   Drug use: Not Currently    ALLERGIES:  has No Known Allergies.  MEDICATIONS:  Current Outpatient Medications  Medication Sig Dispense Refill   acetaminophen (TYLENOL) 500 MG tablet Take 1 tablet (500 mg total) by mouth every 6 (six) hours as needed. 30 tablet 0   albuterol (VENTOLIN HFA) 108 (90 Base) MCG/ACT inhaler TAKE 2 PUFFS BY MOUTH EVERY 6 HOURS AS NEEDED  FOR WHEEZE OR SHORTNESS OF BREATH 18 each 5   Ca Carbonate-Mag Hydroxide (ROLAIDS PO) Take 1 tablet by mouth as needed.     darolutamide (NUBEQA) 300 MG tablet Take 2 tablets (600 mg total) by mouth 2 (two) times daily with a meal. 120 tablet 2   lidocaine-prilocaine (EMLA) cream Apply on the port. 30 -45 min  prior to port access. 30 g 3   atorvastatin (LIPITOR) 40 MG tablet Take 1 tablet (40 mg total) by mouth daily. In place of rosuvastatin due to caner treatment (Patient not taking: Reported on 08/10/2022) 90 tablet 1   ezetimibe (ZETIA) 10 MG tablet Take 1 tablet (10 mg total) by mouth every morning. (Patient not taking: Reported on 08/10/2022) 90 tablet 1   Fluticasone-Umeclidin-Vilant (TRELEGY ELLIPTA) 100-62.5-25  MCG/ACT AEPB Inhale 1 puff into the lungs daily. (Patient not taking: Reported on 08/10/2022) 3 each 1   gabapentin (NEURONTIN) 100 MG capsule Take 1 pill at nighttime for 1 week.  If tolerating well take 2 pills at night. (Patient not taking: Reported on 08/10/2022) 60 capsule 0   metoprolol succinate (TOPROL-XL) 25 MG 24 hr tablet Take 1 tablet (25 mg total) by mouth daily. Take with or immediately following a meal. (Patient not taking: Reported on 08/10/2022) 90 tablet 1   ondansetron (ZOFRAN) 8 MG tablet One pill every 8 hours as needed for nausea/vomitting. (Patient not taking: Reported on 08/10/2022) 40 tablet 1   prochlorperazine (COMPAZINE) 10 MG tablet Take 1 tablet (10 mg total) by mouth every 6 (six) hours as needed for nausea or vomiting. (Patient not taking: Reported on 08/10/2022) 40 tablet 1   tamsulosin (FLOMAX) 0.4 MG CAPS capsule TAKE 1 CAPSULE BY MOUTH EVERY DAY AFTER SUPPER (Patient not taking: Reported on 08/10/2022) 90 capsule 1   No current facility-administered medications for this visit.    PHYSICAL EXAMINATION: ECOG PERFORMANCE STATUS: 1 - Symptomatic but completely ambulatory  BP 125/77   Pulse 92   Temp 97.7 F (36.5 C) (Tympanic)   Resp 16   Wt 150 lb (68 kg)   BMI 24.21 kg/m   Filed Weights   11/09/22 1400  Weight: 140 lb 4.8 oz (63.6 kg)   4-5 weakness noted right upper extremity grip strength.   Physical Exam Vitals and nursing note reviewed.  HENT:     Head: Normocephalic and atraumatic.     Mouth/Throat:     Pharynx: Oropharynx is clear.  Eyes:     Extraocular Movements: Extraocular movements intact.     Pupils: Pupils are equal, round, and reactive to light.  Cardiovascular:     Rate and Rhythm: Normal rate and regular rhythm.  Pulmonary:     Comments: Decreased breath sounds bilaterally.  Abdominal:     Palpations: Abdomen is soft.  Musculoskeletal:        General: Normal range of motion.     Cervical back: Normal range of motion.   Skin:    General: Skin is warm.  Neurological:     General: No focal deficit present.     Mental Status: He is alert and oriented to person, place, and time.  Psychiatric:        Behavior: Behavior normal.        Judgment: Judgment normal.     LABORATORY DATA:  I have reviewed the data as listed    Component Value Date/Time   NA 137 08/10/2022 1301   NA 142 08/17/2015 0928   K 4.2 08/10/2022 1301  CL 106 08/10/2022 1301   CO2 25 08/10/2022 1301   GLUCOSE 98 08/10/2022 1301   BUN 25 (H) 08/10/2022 1301   BUN 17 08/17/2015 0928   CREATININE 1.07 08/10/2022 1301   CREATININE 1.06 09/30/2021 1119   CALCIUM 8.8 (L) 08/10/2022 1301   PROT 6.8 08/10/2022 1301   PROT 6.8 08/17/2015 0928   ALBUMIN 3.5 08/10/2022 1301   ALBUMIN 4.1 08/17/2015 0928   AST 15 08/10/2022 1301   ALT 9 08/10/2022 1301   ALKPHOS 297 (H) 08/10/2022 1301   BILITOT 0.4 08/10/2022 1301   BILITOT 0.4 08/17/2015 0928   GFRNONAA >60 08/10/2022 1301   GFRNONAA 50 (L) 03/03/2021 1050   GFRAA 59 (L) 03/03/2021 1050    No results found for: "SPEP", "UPEP"  Lab Results  Component Value Date   WBC 6.6 08/10/2022   NEUTROABS 4.1 08/10/2022   HGB 11.5 (L) 08/10/2022   HCT 35.0 (L) 08/10/2022   MCV 100.3 (H) 08/10/2022   PLT 276 08/10/2022      Chemistry      Component Value Date/Time   NA 137 08/10/2022 1301   NA 142 08/17/2015 0928   K 4.2 08/10/2022 1301   CL 106 08/10/2022 1301   CO2 25 08/10/2022 1301   BUN 25 (H) 08/10/2022 1301   BUN 17 08/17/2015 0928   CREATININE 1.07 08/10/2022 1301   CREATININE 1.06 09/30/2021 1119      Component Value Date/Time   CALCIUM 8.8 (L) 08/10/2022 1301   ALKPHOS 297 (H) 08/10/2022 1301   AST 15 08/10/2022 1301   ALT 9 08/10/2022 1301   BILITOT 0.4 08/10/2022 1301   BILITOT 0.4 08/17/2015 5329       RADIOGRAPHIC STUDIES: I have personally reviewed the radiological images as listed and agreed with the findings in the report. No results found.    #Stage IV-castrate resistant prostate cancer [OCT 2023] with multiple bone metastases/extensive lymphadenopathy; Currently s/p cycle # 6 of Taxotere [60 mg per metered square]- finished JULY, 2023. NOV 2023-PET scan shows multiple progressive bone lesions; shows no focal activity in the prostate/abdominal lymph nodes.  PSA rising-concerning for progressive disease.  # Currently on  ADT-Eligard; iscontinued darolutamide sec to progression.  Given the progressive disease discussed regarding use of Pluvicto.  Awaiting evaluation/Pluvicto with Dr.Stewart on 1/25.   # PN- from taxotere- G-2-3; Right UE weakness- S/p evaluation to Dr. Mickeal Skinner- MRI cervical spine demonstrate moderate degenerative changes, but not localizing to lower cervical region (C8/T1) which would correspond to his symptoms.  Declined physical therapy.  I discussed the role of Zometa to decrease skeletal related events [pain; fractures; need for radiation; hypercalcemia].  Discussed the potential side effects including but not limited to-infusion reaction; hypocalcemia; and Osteo-necrosis of jaw. Continue  ca+Vit D BID. Corrected calcium is 9.3  # Moderate anemia hemoglobin 9-likely secondary to underlying prostate cancer/chronic disease.  Check iron studies.   # Prostatism symptoms: Continue Flomax; stable.   # Weight loss/malnutrition-s/p evaluation with nutrition-  Improving- stable.   # IV access: functional refilled-port flush every 2 to 3 months.  emla cream.   # Vaccination: s/p flu shot; reocmmend COVID; RSV   # eligard q 80M-last July 2023; [prefers 1-2 pm]  # DISPOSITION:   #  Eligard ;Zometa  # follow up in 1 month MD; labs-/port- cbc/cmp; PSA- add iron studies; ferritin- - Dr.B    Orders Placed This Encounter  Procedures   NM PET (PSMA) SKULL TO [place]    Standing  Status:   Future    Standing Expiration Date:   08/11/2023    Order Specific Question:   If indicated for the ordered procedure, I authorize the  administration of a radiopharmaceutical per Radiology protocol    Answer:   Yes    Order Specific Question:   Preferred imaging location?    Answer:   [facility]   MR BRAIN W WO CONTRAST    Standing Status:   Future    Standing Expiration Date:   08/11/2023    Order Specific Question:   If indicated for the ordered procedure, I authorize the administration of contrast media per Radiology protocol    Answer:   Yes    Order Specific Question:   What is the patient's sedation requirement?    Answer:   No Sedation    Order Specific Question:   Does the patient have a pacemaker or implanted devices?    Answer:   No    Order Specific Question:   Use SRS Protocol?    Answer:   No    Order Specific Question:   Preferred imaging location?    Answer:   [facility] (table limit-350lbs)   Amb Referral to Neuro Oncology    Referral Priority:   Routine    Referral Type:   Consultation    Referral Reason:   Specialty Services Required    Number of Visits Requested:   1   All questions were answered. The patient knows to call the clinic with any problems, questions or concerns.      Cammie Sickle, MD 11/09/2022 4:23 PM

## 2022-11-15 NOTE — Written Directive (Signed)
  PLUVICTO  THERAPY   RADIOPHARMACEUTICAL: Lutetium 177 vipivotide tetraxetan (Pluvicto)     PRESCRIBED DOSE FOR ADMINISTRATION:  200 mCi   ROUTE OFADMINISTRATION:  IV   DIAGNOSIS:  Prostate Cancer   REFERRING PHYSICIAN: Charlaine Dalton   TREATMENT #: 1   ADDITIONAL PHYSICIAN COMMENTS/NOTES:   AUTHORIZED USER SIGNATURE & TIME STAMP:

## 2022-11-16 ENCOUNTER — Ambulatory Visit: Payer: Medicare HMO | Admitting: Urology

## 2022-11-16 ENCOUNTER — Encounter: Payer: Self-pay | Admitting: Urology

## 2022-11-17 ENCOUNTER — Telehealth: Payer: Self-pay | Admitting: Internal Medicine

## 2022-11-17 ENCOUNTER — Ambulatory Visit (HOSPITAL_COMMUNITY)
Admission: RE | Admit: 2022-11-17 | Discharge: 2022-11-17 | Disposition: A | Discharge status: Home / Self Care | Payer: Medicare HMO | Source: Ambulatory Visit | Attending: Internal Medicine | Admitting: Internal Medicine

## 2022-11-17 DIAGNOSIS — C61 Malignant neoplasm of prostate: Secondary | ICD-10-CM | POA: Diagnosis not present

## 2022-11-17 LAB — COMPREHENSIVE METABOLIC PANEL
ALT: 10 U/L (ref 0–44)
AST: 27 U/L (ref 15–41)
Albumin: 3.2 g/dL — ABNORMAL LOW (ref 3.5–5.0)
Alkaline Phosphatase: 624 U/L — ABNORMAL HIGH (ref 38–126)
Anion gap: 10 (ref 5–15)
BUN: 23 mg/dL (ref 8–23)
CO2: 19 mmol/L — ABNORMAL LOW (ref 22–32)
Calcium: 8.5 mg/dL — ABNORMAL LOW (ref 8.9–10.3)
Chloride: 106 mmol/L (ref 98–111)
Creatinine, Ser: 1.24 mg/dL (ref 0.61–1.24)
GFR, Estimated: 59 mL/min — ABNORMAL LOW (ref >60)
Glucose, Bld: 130 mg/dL — ABNORMAL HIGH (ref 70–99)
Potassium: 4.5 mmol/L (ref 3.5–5.1)
Sodium: 135 mmol/L (ref 135–145)
Total Bilirubin: 0.7 mg/dL (ref 0.3–1.2)
Total Protein: 6.7 g/dL (ref 6.5–8.1)

## 2022-11-17 LAB — CBC
HCT: 28.2 % — ABNORMAL LOW (ref 39.0–52.0)
Hemoglobin: 9.1 g/dL — ABNORMAL LOW (ref 13.0–17.0)
MCH: 31.8 pg (ref 26.0–34.0)
MCHC: 32.3 g/dL (ref 30.0–36.0)
MCV: 98.6 fL (ref 80.0–100.0)
Platelets: 216 10*3/uL (ref 150–400)
RBC: 2.86 MIL/uL — ABNORMAL LOW (ref 4.22–5.81)
RDW: 15.9 % — ABNORMAL HIGH (ref 11.5–15.5)
WBC: 7.5 10*3/uL (ref 4.0–10.5)
nRBC: 0.4 % — ABNORMAL HIGH (ref 0.0–0.2)

## 2022-11-17 LAB — PSA: Prostatic Specific Antigen: >1500 ng/mL — ABNORMAL HIGH (ref 0.00–4.00)

## 2022-11-17 MED ORDER — LUTETIUM LU 177 VIPIVOTIDE TET 1000 MBQ/ML IV SOLN
197.7400 | Freq: Once | INTRAVENOUS | Status: AC
  Administered 2022-11-17: 197.74 via INTRAVENOUS

## 2022-11-17 MED ORDER — SODIUM CHLORIDE 0.9 % IV BOLUS
1000.0000 mL | Freq: Once | INTRAVENOUS | Status: AC
  Administered 2022-11-17: 1000 mL via INTRAVENOUS

## 2022-11-17 NOTE — Telephone Encounter (Signed)
Attempt made to reach patient to let him know that per Dr. Rogue Bussing, appointments need to be moved up sooner.  I have rescheduled appointments as requested, but was unable to leave a VM with patient.   Barbara--can you follow up? Thanks!

## 2022-11-17 NOTE — Progress Notes (Signed)
CLINICAL DATA: [82 year old male with castrate resistant prostate carcinoma.  Widespread skeletal metastasis identified by PSMA PET scan.  Patient completed taxing chemotherapy.  Rapid rise in PSA recently.]  EXAM: NUCLEAR MEDICINE PLUVICTO INJECTION  TECHNIQUE: Infusion: The nuclear medicine technologist and I personally verified the dose activity to be delivered as specified in the written directive, and verified the patient identification via 2 separate methods.  Initial flush of the intravenous catheter was performed was sterile saline. The dose syringe was connected to the catheter and the Lu-177 Pluvicto administered over a 1 to 10 min infusion. Single 10 cc  lushes with normal saline follow the dose. No complications were noted. The entire IV tubing, venocatheter, stopcock and syringes was removed in total, placed in a disposal bag and sent for assay of the residual activity, which will be reported at a later time in our EMR by the physics staff. Pressure was applied to the venipuncture site, and a compression bandage placed. Patient monitored for 1 hour following infusion.    Radiation Safety personnel were present to perform the discharge survey, as detailed on their documentation. After a short period of observation, the patient had his IV removed.  RADIOPHARMACEUTICALS: [197.7] microcuries Lu-177 PLUVICTO  FINDINGS: Current Infusion: [1]  Planned Infusions: 6    Patient presented to nuclear medicine for treatment. The patient's most recent blood counts were reviewed and remains a adequate candidate to proceed with Lu-177 Pluvicto.     Mild myelosuppression related prior chemotherapy.  Hemoglobin equal 9.1.  RIGHT blood blood cells platelets normal.  Renal function and hepatic function adequate.     PSA markedly elevated greater than 1,500 increased from 930 from one  month prior.     The patient was situated in an infusion suite with a contact barrier placed under the  arm. Intravenous access was established, using sterile technique, and a normal saline infusion from a syringe was started.     Micro-dosimetry: The prescribed radiation activity was assayed and confirmed to be within specified tolerance.  IMPRESSION: Current Infusion: [1]  Planned Infusions: 6    [The patient tolerated the infusion well. The patient will return in 6 weeks for ongoing care.]    Patient to return to oncologist in 4-5 weeks for MD  evaluation and laboratory evaluation (CBC and CMP)

## 2022-11-23 ENCOUNTER — Inpatient Hospital Stay: Payer: Medicare HMO

## 2022-11-25 MED FILL — Dexamethasone Sodium Phosphate Inj 100 MG/10ML: INTRAMUSCULAR | Qty: 1 | Status: AC

## 2022-11-28 ENCOUNTER — Inpatient Hospital Stay: Payer: Medicare HMO | Attending: Internal Medicine

## 2022-11-28 ENCOUNTER — Encounter: Payer: Self-pay | Admitting: Internal Medicine

## 2022-11-28 ENCOUNTER — Inpatient Hospital Stay (HOSPITAL_BASED_OUTPATIENT_CLINIC_OR_DEPARTMENT_OTHER): Payer: Medicare HMO | Admitting: Internal Medicine

## 2022-11-28 DIAGNOSIS — C7951 Secondary malignant neoplasm of bone: Secondary | ICD-10-CM | POA: Diagnosis not present

## 2022-11-28 DIAGNOSIS — C61 Malignant neoplasm of prostate: Secondary | ICD-10-CM | POA: Insufficient documentation

## 2022-11-28 DIAGNOSIS — F17210 Nicotine dependence, cigarettes, uncomplicated: Secondary | ICD-10-CM | POA: Diagnosis not present

## 2022-11-28 DIAGNOSIS — R634 Abnormal weight loss: Secondary | ICD-10-CM | POA: Insufficient documentation

## 2022-11-28 DIAGNOSIS — I10 Essential (primary) hypertension: Secondary | ICD-10-CM | POA: Insufficient documentation

## 2022-11-28 DIAGNOSIS — R0600 Dyspnea, unspecified: Secondary | ICD-10-CM | POA: Diagnosis not present

## 2022-11-28 DIAGNOSIS — Z7952 Long term (current) use of systemic steroids: Secondary | ICD-10-CM | POA: Diagnosis not present

## 2022-11-28 DIAGNOSIS — R5383 Other fatigue: Secondary | ICD-10-CM | POA: Diagnosis not present

## 2022-11-28 LAB — CBC WITH DIFFERENTIAL/PLATELET
Abs Immature Granulocytes: 0.14 10*3/uL — ABNORMAL HIGH (ref 0.00–0.07)
Basophils Absolute: 0 10*3/uL (ref 0.0–0.1)
Basophils Relative: 0 %
Eosinophils Absolute: 0 10*3/uL (ref 0.0–0.5)
Eosinophils Relative: 0 %
HCT: 26.5 % — ABNORMAL LOW (ref 39.0–52.0)
Hemoglobin: 8.6 g/dL — ABNORMAL LOW (ref 13.0–17.0)
Immature Granulocytes: 3 %
Lymphocytes Relative: 19 %
Lymphs Abs: 0.9 10*3/uL (ref 0.7–4.0)
MCH: 32.2 pg (ref 26.0–34.0)
MCHC: 32.5 g/dL (ref 30.0–36.0)
MCV: 99.3 fL (ref 80.0–100.0)
Monocytes Absolute: 0.4 10*3/uL (ref 0.1–1.0)
Monocytes Relative: 10 %
Neutro Abs: 3.1 10*3/uL (ref 1.7–7.7)
Neutrophils Relative %: 68 %
Platelets: 204 10*3/uL (ref 150–400)
RBC: 2.67 MIL/uL — ABNORMAL LOW (ref 4.22–5.81)
RDW: 16.1 % — ABNORMAL HIGH (ref 11.5–15.5)
WBC: 4.5 10*3/uL (ref 4.0–10.5)
nRBC: 1.3 % — ABNORMAL HIGH (ref 0.0–0.2)

## 2022-11-28 LAB — COMPREHENSIVE METABOLIC PANEL
ALT: 11 U/L (ref 0–44)
AST: 25 U/L (ref 15–41)
Albumin: 2.9 g/dL — ABNORMAL LOW (ref 3.5–5.0)
Alkaline Phosphatase: 632 U/L — ABNORMAL HIGH (ref 38–126)
Anion gap: 9 (ref 5–15)
BUN: 16 mg/dL (ref 8–23)
CO2: 19 mmol/L — ABNORMAL LOW (ref 22–32)
Calcium: 8.1 mg/dL — ABNORMAL LOW (ref 8.9–10.3)
Chloride: 104 mmol/L (ref 98–111)
Creatinine, Ser: 1.15 mg/dL (ref 0.61–1.24)
GFR, Estimated: >60 mL/min (ref >60)
Glucose, Bld: 113 mg/dL — ABNORMAL HIGH (ref 70–99)
Potassium: 4.5 mmol/L (ref 3.5–5.1)
Sodium: 132 mmol/L — ABNORMAL LOW (ref 135–145)
Total Bilirubin: 0.5 mg/dL (ref 0.3–1.2)
Total Protein: 6.6 g/dL (ref 6.5–8.1)

## 2022-11-28 LAB — PSA: Prostatic Specific Antigen: >1500 ng/mL — ABNORMAL HIGH (ref 0.00–4.00)

## 2022-11-28 MED ORDER — PREDNISONE 20 MG PO TABS: 40.0000 mg | ORAL_TABLET | Freq: Every day | ORAL | 0 refills | Status: DC

## 2022-11-28 MED ORDER — OLANZAPINE 5 MG PO TABS: 5.0000 mg | ORAL_TABLET | Freq: Every day | ORAL | 3 refills | Status: DC

## 2022-11-28 NOTE — Progress Notes (Signed)
Increasing SOBr  Decreasing appetite with 7 lb wt loss since 11/09/22.  Patient O2 sats 99% on room air with HR 112.  With standing O2 sats 95% with HR at 122 bpm.  Not able to stand longer that 30 seconds due to feeling lightheaded.  Also reports feeling increase in heart rate with standing.

## 2022-11-28 NOTE — Assessment & Plan Note (Addendum)
#  Stage IV-castrate resistant prostate cancer [OCT 2023] with multiple bone metastases/extensive lymphadenopathy; Currently s/p cycle # 6 of Taxotere [60 mg per metered square]- finished JULY, 2023. NOV 2023-PET scan shows multiple progressive bone lesions; shows no focal activity in the prostate/abdominal lymph nodes.  PSA rising-concerning for progressive disease.  # Currently on  ADT-Eligard; discontinued darolutamide sec to progression.  Given the progressive disease patient s/p Pluvicto with Dr.Stewart on 1/25.  #2 next March 7th-patient tolerating fairly well.  # Weight loss/ dyspnea/fatigue-likely secondary to progression of disease.  Recommend prednisone 40 mg a day for the next 3 weeks.  Also recommend Zyprexa at nighttime.  # PN- from taxotere- G-2-3; Right UE weakness- S/p evaluation to Dr. Marjo Bicker post MRI of the cervical spine.  Stable.  # Moderate anemia hemoglobin 8-9-likely secondary to underlying prostate cancer/chronic disease.  Monitor for now.  Notice of iron deficiency.  # Prostatism symptoms: Continue Flomax; stable.   # Diffuse bone metastases/castrate resistant prostate cancer -currently on Zometa every 2 months or so.  Continue  ca+Vit D BID. Corrected calcium is 9.3  # Weight loss/malnutrition-sec to underlying cancer- s/p evaluation with nutrition-  10 pounds in last 2 months- start zyprexa 5 mg; and increase to 10 mg qhs if needed.  See above.  # IV access: functional refilled-port flush every 2 to 3 months.  emla cream.   # Vaccination: s/p flu shot; reocmmend COVID; RSV   # eligard q 55M-last jan 17th, 2024 ; [prefers 1-2 pm]; zometa on 11/09/2022  # DISPOSITION:   # follow up in 3 week MD; labs-/port- cbc/cmp; PSA; possible venofer-  Dr.B

## 2022-11-28 NOTE — Progress Notes (Signed)
[address] [facility] OFFICE PROGRESS NOTE  Patient Care Team: Steele Sizer, MD as PCP - General (Family Medicine) Cammie Sickle, MD as Consulting Physician (Oncology) Abbie Sons, MD (Urology)   Cancer Staging  Prostate cancer [facility]) Staging form: Prostate, AJCC 8th Edition - Clinical: Stage IVB (cT2c, cN1, cM1, PSA: 374, Grade Group: 5) - Signed by Cammie Sickle, MD on 12/13/2021 Prostate specific antigen (PSA) range: 20 or greater Histologic grading system: 5 grade system    Oncology History Overview Note  compatible with widespread metastatic prostate cancer, including innumerable sclerotic osseous lesions and extensive lymphadenopathy in the pelvis and retroperitoneum, as detailed above. 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 2. There is also an indeterminate hypovascular lesion in segment 5 of the liver which may represent a metastatic lesion. This could be better characterized with follow-up abdominal MRI with and without IV gadolinium if clinically appropriate. 3. Aortic atherosclerosis with mild fusiform aneurysmal dilatation of the infrarenal abdominal aorta measuring up to 3.2 x 3.0 cm. Recommend follow-up ultrasound every 3 years. This recommendation follows ACR consensus guidelines: White Paper of the ACR Incidental Findings Committee II on Vascular Findings. J Am Coll Radiol 2013; 10:789-794. 4. Bladder wall appears mildly thickened, likely related to bladder outlet obstruction given the enlarged prostate gland. 5. Severe colonic diverticulosis without evidence of acute diverticulitis at this time.  IMPRESSION: There are numerous foci of abnormal tracer uptake in the axial and proximal appendicular skeleton consistent with extensive skeletal metastatic disease.     #Stage IV castrate sensitive prostate cancer-December/20  2023-MRI negative for any liver lesions.; FEB 24th, 2022-Firmagon loading dose;  # march 24th-Taxoeter with udenyca; 4/12- add darolutamide  # DEC 2022- 83 [[place]; Urology]   Prostate cancer ([place])  12/13/2021 Initial Diagnosis   Prostate cancer ([place])   12/13/2021 Cancer Staging   Staging form: Prostate, AJCC 8th Edition - Clinical: Stage IVB (cT2c, cN1, cM1, PSA: 374, Grade Group: 5) - Signed by Cammie Sickle, MD on 12/13/2021 Prostate specific antigen (PSA) range: 20 or greater Histologic grading system: 5 grade system   01/12/2022 - 04/28/2022 Chemotherapy   Patient is on Treatment Plan : PROSTATE Docetaxel + Prednisone q21d      Genetic Testing   Negative genetic testing. No pathogenic variants identified on the [facility] Common Hereditary Cancers +RNA panel. VUS in BRIP1 called c.485G>T identified. The report date is 03/04/2022.  The Common Hereditary Cancers Panel + RNA offered by [facility] includes sequencing and/or deletion duplication testing of the following 47 genes: APC, ATM, AXIN2, BARD1, BMPR1A, BRCA1, BRCA2, BRIP1, CDH1, CDKN2A (p14ARF), CDKN2A (p16INK4a), CKD4, CHEK2, CTNNA1, DICER1, EPCAM (Deletion/duplication testing only), GREM1 (promoter region deletion/duplication testing only), KIT, MEN1, MLH1, MSH2, MSH3, MSH6, MUTYH, NBN, NF1, NHTL1, PALB2, PDGFRA, PMS2, POLD1, POLE, PTEN, RAD50, RAD51C, RAD51D, SDHB, SDHC, SDHD, SMAD4, SMARCA4. STK11, TP53, TSC1, TSC2, and VHL.  The following genes were evaluated for sequence changes only: SDHA and HOXB13 c.251G>A variant only.      HISTORY OF PRESENT ILLNESS: Ambulating with a cane.  With daughter.    Eric [name] 82 y.o.  male pleasant patient above history of castrate resistant metastatic prostate cancer currently currently Eligard plus pluvicto is here for follow-up.    Patient feels he has increase in SOBr today.  Shortness of breath is worse with exertion.  Denies any chest pain.  Positive for cough.  Decrease in  appetite with 4 lb wt loss.   Complains of moderate to severe fatigue.  No nausea no vomiting.  No sores in the mouth.  Poor appetite.  Review of Systems  Constitutional:  Positive for malaise/fatigue and weight loss. Negative for chills, diaphoresis and fever.  HENT:  Negative for nosebleeds and sore throat.   Eyes:  Negative for double vision.  Respiratory:  Positive for shortness of breath. Negative for cough, hemoptysis, sputum production and wheezing.   Cardiovascular:  Negative for chest pain, palpitations, orthopnea and leg swelling.  Gastrointestinal:  Negative for abdominal pain, blood in stool, constipation, diarrhea, heartburn, melena, nausea and vomiting.  Genitourinary:  Negative for dysuria, frequency and urgency.  Musculoskeletal:  Positive for back pain and joint pain.  Skin: Negative.  Negative for itching and rash.  Neurological:  Negative for dizziness, tingling, focal weakness, weakness and headaches.  Endo/Heme/Allergies:  Does not bruise/bleed easily.  Psychiatric/Behavioral:  Negative for depression. The patient is not nervous/anxious and does not have insomnia.       PAST MEDICAL HISTORY :  Past Medical History:  Diagnosis Date   Cancer ([place])    COPD (chronic obstructive pulmonary disease) ([place])    Dyspnea    with exertion   Family history of cancer    GERD (gastroesophageal reflux disease)    occ   Hyperlipidemia    Hypertension     PAST SURGICAL HISTORY :   Past Surgical History:  Procedure Laterality Date   COLONOSCOPY     HEMORROIDECTOMY     IR IMAGING GUIDED PORT INSERTION  12/20/2021   PROSTATE BIOPSY N/A 10/26/2021   Procedure: PROSTATE BIOPSY;  Surgeon: Abbie Sons, MD;  Location: [facility] ORS;  Service: Urology;  Laterality: N/A;   TRANSRECTAL ULTRASOUND N/A 10/26/2021   Procedure: TRANSRECTAL ULTRASOUND;  Surgeon: Abbie Sons, MD;  Location: [facility] ORS;  Service: Urology;  Laterality: N/A;    FAMILY HISTORY :   Family History  Problem  Relation Age of Onset   Heart disease Mother    COPD Father    Cancer Brother 96       blood cancer; unk exact type   Cancer Maternal Aunt        unk type   Cancer Paternal Aunt        unk type   Cancer Paternal Uncle        one unk type; one had throat   Cancer Cousin        unk types    SOCIAL HISTORY:   Social History   Tobacco Use   Smoking status: Every Day    Packs/day: 0.50    Years: 60.00    Total pack years: 30.00    Types: Cigarettes   Smokeless tobacco: Never   Tobacco comments:    smoking cessation information provided  Vaping Use   Vaping Use: Never used  Substance Use Topics   Alcohol use: Not Currently   Drug use: Not Currently    ALLERGIES:  has No Known Allergies.  MEDICATIONS:  Current Outpatient Medications  Medication Sig Dispense Refill   acetaminophen (TYLENOL) 500 MG tablet Take 1 tablet (500 mg total) by mouth every 6 (six) hours as needed. 30 tablet 0   albuterol (VENTOLIN HFA) 108 (90 Base) MCG/ACT inhaler TAKE 2 PUFFS BY MOUTH EVERY 6 HOURS AS NEEDED FOR WHEEZE OR SHORTNESS OF BREATH 18 each 5   Ca Carbonate-Mag Hydroxide (ROLAIDS PO) Take 1 tablet by mouth as needed.  darolutamide (NUBEQA) 300 MG tablet Take 2 tablets (600 mg total) by mouth 2 (two) times daily with a meal. 120 tablet 2   lidocaine-prilocaine (EMLA) cream Apply on the port. 30 -45 min  prior to port access. 30 g 3   atorvastatin (LIPITOR) 40 MG tablet Take 1 tablet (40 mg total) by mouth daily. In place of rosuvastatin due to caner treatment (Patient not taking: Reported on 08/10/2022) 90 tablet 1   ezetimibe (ZETIA) 10 MG tablet Take 1 tablet (10 mg total) by mouth every morning. (Patient not taking: Reported on 08/10/2022) 90 tablet 1   Fluticasone-Umeclidin-Vilant (TRELEGY ELLIPTA) 100-62.5-25 MCG/ACT AEPB Inhale 1 puff into the lungs daily. (Patient not taking: Reported on 08/10/2022) 3 each 1   gabapentin (NEURONTIN) 100 MG capsule Take 1 pill at nighttime for 1 week.   If tolerating well take 2 pills at night. (Patient not taking: Reported on 08/10/2022) 60 capsule 0   metoprolol succinate (TOPROL-XL) 25 MG 24 hr tablet Take 1 tablet (25 mg total) by mouth daily. Take with or immediately following a meal. (Patient not taking: Reported on 08/10/2022) 90 tablet 1   ondansetron (ZOFRAN) 8 MG tablet One pill every 8 hours as needed for nausea/vomitting. (Patient not taking: Reported on 08/10/2022) 40 tablet 1   prochlorperazine (COMPAZINE) 10 MG tablet Take 1 tablet (10 mg total) by mouth every 6 (six) hours as needed for nausea or vomiting. (Patient not taking: Reported on 08/10/2022) 40 tablet 1   tamsulosin (FLOMAX) 0.4 MG CAPS capsule TAKE 1 CAPSULE BY MOUTH EVERY DAY AFTER SUPPER (Patient not taking: Reported on 08/10/2022) 90 capsule 1   No current facility-administered medications for this visit.    PHYSICAL EXAMINATION: ECOG PERFORMANCE STATUS: 1 - Symptomatic but completely ambulatory  BP 125/77   Pulse 92   Temp 97.7 F (36.5 C) (Tympanic)   Resp 16   Wt 150 lb (68 kg)   BMI 24.21 kg/m   Filed Weights   11/28/22 1500  Weight: 133 lb 8 oz (60.6 kg)   4-5 weakness noted right upper extremity grip strength.   Physical Exam Vitals and nursing note reviewed.  HENT:     Head: Normocephalic and atraumatic.     Mouth/Throat:     Pharynx: Oropharynx is clear.  Eyes:     Extraocular Movements: Extraocular movements intact.     Pupils: Pupils are equal, round, and reactive to light.  Cardiovascular:     Rate and Rhythm: Normal rate and regular rhythm.  Pulmonary:     Comments: Decreased breath sounds bilaterally.  Abdominal:     Palpations: Abdomen is soft.  Musculoskeletal:        General: Normal range of motion.     Cervical back: Normal range of motion.  Skin:    General: Skin is warm.  Neurological:     General: No focal deficit present.     Mental Status: He is alert and oriented to person, place, and time.  Psychiatric:         Behavior: Behavior normal.        Judgment: Judgment normal.     LABORATORY DATA:  I have reviewed the data as listed    Component Value Date/Time   NA 137 08/10/2022 1301   NA 142 08/17/2015 0928   K 4.2 08/10/2022 1301   CL 106 08/10/2022 1301   CO2 25 08/10/2022 1301   GLUCOSE 98 08/10/2022 1301   BUN 25 (H) 08/10/2022 1301   BUN  17 08/17/2015 0928   CREATININE 1.07 08/10/2022 1301   CREATININE 1.06 09/30/2021 1119   CALCIUM 8.8 (L) 08/10/2022 1301   PROT 6.8 08/10/2022 1301   PROT 6.8 08/17/2015 0928   ALBUMIN 3.5 08/10/2022 1301   ALBUMIN 4.1 08/17/2015 0928   AST 15 08/10/2022 1301   ALT 9 08/10/2022 1301   ALKPHOS 297 (H) 08/10/2022 1301   BILITOT 0.4 08/10/2022 1301   BILITOT 0.4 08/17/2015 0928   GFRNONAA >60 08/10/2022 1301   GFRNONAA 50 (L) 03/03/2021 1050   GFRAA 59 (L) 03/03/2021 1050    No results found for: "SPEP", "UPEP"  Lab Results  Component Value Date   WBC 6.6 08/10/2022   NEUTROABS 4.1 08/10/2022   HGB 11.5 (L) 08/10/2022   HCT 35.0 (L) 08/10/2022   MCV 100.3 (H) 08/10/2022   PLT 276 08/10/2022      Chemistry      Component Value Date/Time   NA 137 08/10/2022 1301   NA 142 08/17/2015 0928   K 4.2 08/10/2022 1301   CL 106 08/10/2022 1301   CO2 25 08/10/2022 1301   BUN 25 (H) 08/10/2022 1301   BUN 17 08/17/2015 0928   CREATININE 1.07 08/10/2022 1301   CREATININE 1.06 09/30/2021 1119      Component Value Date/Time   CALCIUM 8.8 (L) 08/10/2022 1301   ALKPHOS 297 (H) 08/10/2022 1301   AST 15 08/10/2022 1301   ALT 9 08/10/2022 1301   BILITOT 0.4 08/10/2022 1301   BILITOT 0.4 08/17/2015 0928       RADIOGRAPHIC STUDIES: I have personally reviewed the radiological images as listed and agreed with the findings in the report. No results found.   #Stage IV-castrate resistant prostate cancer [OCT 2023] with multiple bone metastases/extensive lymphadenopathy; Currently s/p cycle # 6 of Taxotere [60 mg per metered square]- finished  JULY, 2023. NOV 2023-PET scan shows multiple progressive bone lesions; shows no focal activity in the prostate/abdominal lymph nodes.  PSA rising-concerning for progressive disease.  # Currently on  ADT-Eligard; discontinued darolutamide sec to progression.  Given the progressive disease patient s/p Pluvicto with Dr.Stewart on 1/25.  #2 next March 7th-patient tolerating fairly well.  # Weight loss/ dyspnea/fatigue-likely secondary to progression of disease.  Recommend prednisone 40 mg a day for the next 3 weeks.  Also recommend Zyprexa at nighttime.  # PN- from taxotere- G-2-3; Right UE weakness- S/p evaluation to Dr. Marjo Bicker post MRI of the cervical spine.  Stable.  # Moderate anemia hemoglobin 8-9-likely secondary to underlying prostate cancer/chronic disease.  Monitor for now.  Notice of iron deficiency.  # Prostatism symptoms: Continue Flomax; stable.   # Diffuse bone metastases/castrate resistant prostate cancer -currently on Zometa every 2 months or so.  Continue  ca+Vit D BID. Corrected calcium is 9.3  # Weight loss/malnutrition-sec to underlying cancer- s/p evaluation with nutrition-  10 pounds in last 2 months- start zyprexa 5 mg; and increase to 10 mg qhs if needed.  See above.  # IV access: functional refilled-port flush every 2 to 3 months.  emla cream.   # Vaccination: s/p flu shot; reocmmend COVID; RSV   # eligard q 69M-last jan 17th, 2024 ; [prefers 1-2 pm]; zometa on 11/09/2022  # DISPOSITION:   # follow up in 3 week MD; labs-/port- cbc/cmp; PSA; possible venofer-  Dr.B    Orders Placed This Encounter  Procedures   NM PET (PSMA) SKULL TO MID THIGH    Standing Status:   Future  Standing Expiration Date:   08/11/2023    Order Specific Question:   If indicated for the ordered procedure, I authorize the administration of a radiopharmaceutical per Radiology protocol    Answer:   Yes    Order Specific Question:   Preferred imaging location?    Answer:   [facility]   MR BRAIN W WO CONTRAST    Standing Status:   Future    Standing Expiration Date:   08/11/2023    Order Specific Question:   If indicated for the ordered procedure, I authorize the administration of contrast media per Radiology protocol    Answer:   Yes    Order Specific Question:   What is the patient's sedation requirement?    Answer:   No Sedation    Order Specific Question:   Does the patient have a pacemaker or implanted devices?    Answer:   No    Order Specific Question:   Use SRS Protocol?    Answer:   No    Order Specific Question:   Preferred imaging location?    Answer:   [facility] (table limit-350lbs)   Amb Referral to Neuro Oncology    Referral Priority:   Routine    Referral Type:   Consultation    Referral Reason:   Specialty Services Required    Number of Visits Requested:   1   All questions were answered. The patient knows to call the clinic with any problems, questions or concerns.      Cammie Sickle, MD 11/28/2022 9:37 PM

## 2022-12-12 ENCOUNTER — Other Ambulatory Visit: Payer: Medicare HMO

## 2022-12-12 ENCOUNTER — Ambulatory Visit: Payer: Medicare HMO | Admitting: Internal Medicine

## 2022-12-14 ENCOUNTER — Encounter: Payer: Self-pay | Admitting: Internal Medicine

## 2022-12-19 ENCOUNTER — Ambulatory Visit: Payer: Medicare HMO | Admitting: Internal Medicine

## 2022-12-19 ENCOUNTER — Other Ambulatory Visit: Payer: Medicare HMO

## 2022-12-21 ENCOUNTER — Inpatient Hospital Stay: Payer: Medicare HMO

## 2022-12-21 ENCOUNTER — Inpatient Hospital Stay (HOSPITAL_BASED_OUTPATIENT_CLINIC_OR_DEPARTMENT_OTHER): Payer: Medicare HMO | Admitting: Internal Medicine

## 2022-12-21 ENCOUNTER — Other Ambulatory Visit: Payer: Self-pay | Admitting: Internal Medicine

## 2022-12-21 ENCOUNTER — Encounter: Payer: Self-pay | Admitting: Internal Medicine

## 2022-12-21 ENCOUNTER — Other Ambulatory Visit: Payer: Self-pay

## 2022-12-21 VITALS — BP 126/60 | HR 98 | Temp 97.3°F | Resp 18 | Wt 139.8 lb

## 2022-12-21 DIAGNOSIS — C61 Malignant neoplasm of prostate: Secondary | ICD-10-CM

## 2022-12-21 DIAGNOSIS — D649 Anemia, unspecified: Secondary | ICD-10-CM | POA: Diagnosis not present

## 2022-12-21 LAB — COMPREHENSIVE METABOLIC PANEL
ALT: 13 U/L (ref 0–44)
AST: 19 U/L (ref 15–41)
Albumin: 3.2 g/dL — ABNORMAL LOW (ref 3.5–5.0)
Alkaline Phosphatase: 1134 U/L — ABNORMAL HIGH (ref 38–126)
Anion gap: 10 (ref 5–15)
BUN: 23 mg/dL (ref 8–23)
CO2: 22 mmol/L (ref 22–32)
Calcium: 8.3 mg/dL — ABNORMAL LOW (ref 8.9–10.3)
Chloride: 108 mmol/L (ref 98–111)
Creatinine, Ser: 0.87 mg/dL (ref 0.61–1.24)
GFR, Estimated: >60 mL/min (ref >60)
Glucose, Bld: 102 mg/dL — ABNORMAL HIGH (ref 70–99)
Potassium: 4.6 mmol/L (ref 3.5–5.1)
Sodium: 140 mmol/L (ref 135–145)
Total Bilirubin: 0.4 mg/dL (ref 0.3–1.2)
Total Protein: 6.3 g/dL — ABNORMAL LOW (ref 6.5–8.1)

## 2022-12-21 LAB — IRON AND TIBC
Iron: 110 ug/dL (ref 45–182)
Saturation Ratios: 39 % (ref 17.9–39.5)
TIBC: 284 ug/dL (ref 250–450)
UIBC: 174 ug/dL

## 2022-12-21 LAB — CBC WITH DIFFERENTIAL/PLATELET
Abs Immature Granulocytes: 0.05 10*3/uL (ref 0.00–0.07)
Basophils Absolute: 0 10*3/uL (ref 0.0–0.1)
Basophils Relative: 0 %
Eosinophils Absolute: 0 10*3/uL (ref 0.0–0.5)
Eosinophils Relative: 0 %
HCT: 24.4 % — ABNORMAL LOW (ref 39.0–52.0)
Hemoglobin: 7.5 g/dL — ABNORMAL LOW (ref 13.0–17.0)
Immature Granulocytes: 1 %
Lymphocytes Relative: 17 %
Lymphs Abs: 0.9 10*3/uL (ref 0.7–4.0)
MCH: 33 pg (ref 26.0–34.0)
MCHC: 30.7 g/dL (ref 30.0–36.0)
MCV: 107.5 fL — ABNORMAL HIGH (ref 80.0–100.0)
Monocytes Absolute: 0.4 10*3/uL (ref 0.1–1.0)
Monocytes Relative: 8 %
Neutro Abs: 4 10*3/uL (ref 1.7–7.7)
Neutrophils Relative %: 74 %
Platelets: 103 10*3/uL — ABNORMAL LOW (ref 150–400)
RBC: 2.27 MIL/uL — ABNORMAL LOW (ref 4.22–5.81)
RDW: 22.3 % — ABNORMAL HIGH (ref 11.5–15.5)
WBC: 5.3 10*3/uL (ref 4.0–10.5)
nRBC: 3 % — ABNORMAL HIGH (ref 0.0–0.2)

## 2022-12-21 LAB — FERRITIN: Ferritin: 614 ng/mL — ABNORMAL HIGH (ref 24–336)

## 2022-12-21 LAB — ABO/RH: ABO/RH(D): O POS

## 2022-12-21 LAB — PREPARE RBC (CROSSMATCH)

## 2022-12-21 LAB — PSA: Prostatic Specific Antigen: 1217 ng/mL — ABNORMAL HIGH (ref 0.00–4.00)

## 2022-12-21 NOTE — Patient Instructions (Signed)
#   Recommend prednisone 20 mg a day.

## 2022-12-21 NOTE — Progress Notes (Signed)
Pt and caregiver in for follow up, pt reports improved appetite. Denies any concerns today.

## 2022-12-21 NOTE — Progress Notes (Signed)
[place] CONSULT NOTE  Patient Care Team: Steele Sizer, MD as PCP - General (Family Medicine) Cammie Sickle, MD as Consulting Physician (Oncology) Abbie Sons, MD (Urology)  CHIEF COMPLAINTS/PURPOSE OF CONSULTATION: prostate cancer  Oncology History Overview Note  compatible with widespread metastatic prostate cancer, including innumerable sclerotic osseous lesions and extensive lymphadenopathy in the pelvis and retroperitoneum, as detailed above. 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 2. There is also an indeterminate hypovascular lesion in segment 5 of the liver which may represent a metastatic lesion. This could be better characterized with follow-up abdominal MRI with and without IV gadolinium if clinically appropriate. 3. Aortic atherosclerosis with mild fusiform aneurysmal dilatation of the infrarenal abdominal aorta measuring up to 3.2 x 3.0 cm. Recommend follow-up ultrasound every 3 years. This recommendation follows ACR consensus guidelines: White Paper of the ACR Incidental Findings Committee II on Vascular Findings. J Am Coll Radiol 2013; 10:789-794. 4. Bladder wall appears mildly thickened, likely related to bladder outlet obstruction given the enlarged prostate gland. 5. Severe colonic diverticulosis without evidence of acute diverticulitis at this time.  IMPRESSION: There are numerous foci of abnormal tracer uptake in the axial and proximal appendicular skeleton consistent with extensive skeletal metastatic disease.     #Stage IV castrate sensitive prostate cancer-December/20 2023-MRI negative for any liver lesions.; FEB 24th, 2022-Firmagon loading dose;  # march 24th-Taxoeter with udenyca; 4/12- add darolutamide  # DEC 2022- 86 [[place]; Urology]   Prostate cancer ([place])  12/13/2021 Initial Diagnosis   Prostate  cancer ([place])   12/13/2021 Cancer Staging   Staging form: Prostate, AJCC 8th Edition - Clinical: Stage IVB (cT2c, cN1, cM1, PSA: 374, Grade Group: 5) - Signed by Cammie Sickle, MD on 12/13/2021 Prostate specific antigen (PSA) range: 20 or greater Histologic grading system: 5 grade system   01/12/2022 - 04/28/2022 Chemotherapy   Patient is on Treatment Plan : PROSTATE Docetaxel + Prednisone q21d      Genetic Testing   Negative genetic testing. No pathogenic variants identified on the [facility] Common Hereditary Cancers +RNA panel. VUS in BRIP1 called c.485G>T identified. The report date is 03/04/2022.  The Common Hereditary Cancers Panel + RNA offered by [facility] includes sequencing and/or deletion duplication testing of the following 47 genes: APC, ATM, AXIN2, BARD1, BMPR1A, BRCA1, BRCA2, BRIP1, CDH1, CDKN2A (p14ARF), CDKN2A (p16INK4a), CKD4, CHEK2, CTNNA1, DICER1, EPCAM (Deletion/duplication testing only), GREM1 (promoter region deletion/duplication testing only), KIT, MEN1, MLH1, MSH2, MSH3, MSH6, MUTYH, NBN, NF1, NHTL1, PALB2, PDGFRA, PMS2, POLD1, POLE, PTEN, RAD50, RAD51C, RAD51D, SDHB, SDHC, SDHD, SMAD4, SMARCA4. STK11, TP53, TSC1, TSC2, and VHL.  The following genes were evaluated for sequence changes only: SDHA and HOXB13 c.251G>A variant only.      HISTORY OF PRESENTING ILLNESS:  Eric [name] 82 y.o.  male pleasant patient above history of castrate resistant metastatic prostate cancer currently currently Eligard plus pluvicto is here for follow-up.    Pt and caregiver in for follow up, pt reports improved appetite on prednisone 40 mg/day.  Denies any concerns today.  Appetite is improving.  Patient continues to complains of moderate to severe fatigue.  No nausea no vomiting.  No sores in the mouth.  Denies any blood in stools or black or stools.  Review of Systems  Constitutional:  Positive for malaise/fatigue and weight loss. Negative for chills, diaphoresis and fever.  HENT:   Negative for nosebleeds and sore throat.  Eyes:  Negative for double vision.  Respiratory:  Positive for shortness of breath. Negative for cough, hemoptysis, sputum production and wheezing.   Cardiovascular:  Negative for chest pain, palpitations, orthopnea and leg swelling.  Gastrointestinal:  Negative for abdominal pain, blood in stool, constipation, diarrhea, heartburn, melena, nausea and vomiting.  Genitourinary:  Negative for dysuria, frequency and urgency.  Musculoskeletal:  Positive for back pain and joint pain.  Skin: Negative.  Negative for itching and rash.  Neurological:  Negative for dizziness, tingling, focal weakness, weakness and headaches.  Endo/Heme/Allergies:  Does not bruise/bleed easily.  Psychiatric/Behavioral:  Negative for depression. The patient is not nervous/anxious and does not have insomnia.     MEDICAL HISTORY:  Past Medical History:  Diagnosis Date   Cancer ([place])    COPD (chronic obstructive pulmonary disease) ([place])    Dyspnea    with exertion   Family history of cancer    GERD (gastroesophageal reflux disease)    occ   Hyperlipidemia    Hypertension     SURGICAL HISTORY: Past Surgical History:  Procedure Laterality Date   COLONOSCOPY     HEMORROIDECTOMY     IR IMAGING GUIDED PORT INSERTION  12/20/2021   PROSTATE BIOPSY N/A 10/26/2021   Procedure: PROSTATE BIOPSY;  Surgeon: Abbie Sons, MD;  Location: [facility] ORS;  Service: Urology;  Laterality: N/A;   TRANSRECTAL ULTRASOUND N/A 10/26/2021   Procedure: TRANSRECTAL ULTRASOUND;  Surgeon: Abbie Sons, MD;  Location: [facility] ORS;  Service: Urology;  Laterality: N/A;    SOCIAL HISTORY: Social History   Socioeconomic History   Marital status: Single    Spouse name: Not on file   Number of children: 1   Years of education: Not on file   Highest education level: High school graduate  Occupational History   Occupation: retired   Tobacco Use   Smoking status: Every Day    Packs/day: 0.50     Years: 60.00    Total pack years: 30.00    Types: Cigarettes   Smokeless tobacco: Never   Tobacco comments:    smoking cessation information provided  Vaping Use   Vaping Use: Never used  Substance and Sexual Activity   Alcohol use: Not Currently   Drug use: Not Currently   Sexual activity: Not Currently  Other Topics Concern   Not on file  Social History Narrative   Single, lives with male friend Abbott Pao is care giver] for the past 20 plus years. 1/2 ppd smoke; no alcohol. 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 Daughter lives 10-15 mins; in [place].    Social Determinants of Health   Financial Resource Strain: Low Risk  (02/10/2022)   Overall Financial Resource Strain (CARDIA)    Difficulty of Paying Living Expenses: Not hard at all  Food Insecurity: No Food Insecurity (02/10/2022)   Hunger Vital Sign    Worried About Running Out of Food in the Last Year: Never true    Ran Out of Food in the Last Year: Never true  Transportation Needs: No Transportation Needs (02/10/2022)   PRAPARE - Hydrologist (Medical): No    Lack of Transportation (Non-Medical): No  Physical Activity: Inactive (02/10/2022)   Exercise Vital Sign    Days of Exercise per Week: 0 days    Minutes of Exercise per Session: 0 min  Stress: No Stress Concern Present (02/10/2022)   [place]    Feeling of  Stress : Only a little  Social Connections: Socially Isolated (02/10/2022)   Social Connection and Isolation Panel [NHANES]    Frequency of Communication with Friends and Family: More than three times a week    Frequency of Social Gatherings with Friends and Family: Three times a week    Attends Religious Services: Never    Active Member of Clubs or Organizations: No    Attends Archivist Meetings: Never    Marital Status: Widowed  Intimate Partner Violence: Not At Risk (02/10/2022)    Humiliation, Afraid, Rape, and Kick questionnaire    Fear of Current or Ex-Partner: No    Emotionally Abused: No    Physically Abused: No    Sexually Abused: No    FAMILY HISTORY: Family History  Problem Relation Age of Onset   Heart disease Mother    COPD Father    Cancer Brother 64       blood cancer; unk exact type   Cancer Maternal Aunt        unk type   Cancer Paternal Aunt        unk type   Cancer Paternal Uncle        one unk type; one had throat   Cancer Cousin        unk types    ALLERGIES:  has No Known Allergies.  MEDICATIONS:  Current Outpatient Medications  Medication Sig Dispense Refill   acetaminophen (TYLENOL) 500 MG tablet Take 1 tablet (500 mg total) by mouth every 6 (six) hours as needed. 30 tablet 0   albuterol (VENTOLIN HFA) 108 (90 Base) MCG/ACT inhaler TAKE 2 PUFFS BY MOUTH EVERY 6 HOURS AS NEEDED FOR WHEEZE OR SHORTNESS OF BREATH 18 each 5   atorvastatin (LIPITOR) 40 MG tablet TAKE 1 TABLET (40 MG TOTAL) BY MOUTH DAILY. IN PLACE OF ROSUVASTATIN DUE TO CANER TREATMENT 90 tablet 1   Ca Carbonate-Mag Hydroxide (ROLAIDS PO) Take 1 tablet by mouth as needed.     ezetimibe (ZETIA) 10 MG tablet Take 1 tablet (10 mg total) by mouth every morning. 90 tablet 1   Fluticasone-Umeclidin-Vilant (TRELEGY ELLIPTA) 100-62.5-25 MCG/ACT AEPB Inhale 1 puff into the lungs daily. 3 each 1   lidocaine-prilocaine (EMLA) cream Apply on the port. 30 -45 min  prior to port access. 30 g 3   lisinopril (ZESTRIL) 2.5 MG tablet Take by mouth.     metoprolol succinate (TOPROL-XL) 25 MG 24 hr tablet Take 1 tablet (25 mg total) by mouth daily. Take with or immediately following a meal. 90 tablet 1   ondansetron (ZOFRAN) 8 MG tablet One pill every 8 hours as needed for nausea/vomitting. 40 tablet 1   predniSONE (DELTASONE) 20 MG tablet Take 2 tablets (40 mg total) by mouth daily with breakfast. 60 tablet 0   prochlorperazine (COMPAZINE) 10 MG tablet Take 1 tablet (10 mg total) by mouth  every 6 (six) hours as needed for nausea or vomiting. 40 tablet 1   tamsulosin (FLOMAX) 0.4 MG CAPS capsule TAKE 1 CAPSULE BY MOUTH EVERY DAY AFTER SUPPER 90 capsule 1   darolutamide (NUBEQA) 300 MG tablet Take 2 tablets (600 mg total) by mouth 2 (two) times daily with a meal. (Patient not taking: Reported on 11/09/2022) 120 tablet 2   gabapentin (NEURONTIN) 100 MG capsule Take 1 pill at nighttime for 1 week.  If tolerating well take 2 pills at night. (Patient not taking: Reported on 08/10/2022) 60 capsule 0   OLANZapine (ZYPREXA) 5 MG tablet  TAKE 1 TABLET BY MOUTH EVERYDAY AT BEDTIME 90 tablet 2   No current facility-administered medications for this visit.    PHYSICAL EXAMINATION:   Vitals:   12/21/22 1008  BP: 126/60  Pulse: 98  Resp: 18  Temp: (!) 97.3 F (36.3 C)  SpO2: 100%   Filed Weights   12/21/22 1008  Weight: 139 lb 12.8 oz (63.4 kg)    Physical Exam Vitals and nursing note reviewed.  HENT:     Head: Normocephalic and atraumatic.     Mouth/Throat:     Pharynx: Oropharynx is clear.  Eyes:     Extraocular Movements: Extraocular movements intact.     Pupils: Pupils are equal, round, and reactive to light.  Cardiovascular:     Rate and Rhythm: Normal rate and regular rhythm.  Pulmonary:     Comments: Decreased breath sounds bilaterally.  Abdominal:     Palpations: Abdomen is soft.  Musculoskeletal:        General: Normal range of motion.     Cervical back: Normal range of motion.  Skin:    General: Skin is warm.  Neurological:     General: No focal deficit present.     Mental Status: He is alert and oriented to person, place, and time.  Psychiatric:        Behavior: Behavior normal.        Judgment: Judgment normal.     LABORATORY DATA:  I have reviewed the data as listed Lab Results  Component Value Date   WBC 5.3 12/21/2022   HGB 7.5 (L) 12/21/2022   HCT 24.4 (L) 12/21/2022   MCV 107.5 (H) 12/21/2022   PLT 103 (L) 12/21/2022   Recent Labs     11/17/22 1110 11/28/22 1457 12/21/22 0945  NA 135 132* 140  K 4.5 4.5 4.6  CL 106 104 108  CO2 19* 19* 22  GLUCOSE 130* 113* 102*  BUN '23 16 23  '$ CREATININE 1.24 1.15 0.87  CALCIUM 8.5* 8.1* 8.3*  GFRNONAA 59* >60 >60  PROT 6.7 6.6 6.3*  ALBUMIN 3.2* 2.9* 3.2*  AST '27 25 19  '$ ALT '10 11 13  '$ ALKPHOS 624* 632* 1,134*  BILITOT 0.7 0.5 0.4    RADIOGRAPHIC STUDIES: I have personally reviewed the radiological images as listed and agreed with the findings in the report. No results found.   Prostate cancer ([place]) #Stage IV-castrate resistant prostate cancer [OCT 2023] with multiple bone metastases/extensive lymphadenopathy; Currently s/p cycle # 6 of Taxotere [60 mg per metered square]- finished JULY, 2023. NOV 2023-PET scan shows multiple progressive bone lesions; shows no focal activity in the prostate/abdominal lymph nodes.  PSA rising-concerning for progressive disease.  # Currently on  ADT-Eligard; discontinued darolutamide sec to progression.  Given the progressive disease patient s/p Pluvicto q 6 W- with Dr.Stewart on 1/25.  #2 next March 7th-patient tolerating fairly well.  # Weight loss/ dyspnea/fatigue-likely secondary to progression of disease.  Recommend taper prednisone 20 mg a day for the next 3 weeks.  Also recommend Zyprexa at nighttime.  # PN- from taxotere- G-2-3; Right UE weakness- S/p evaluation to Dr. Marjo Bicker post MRI of the cervical spine.  Stable.  # Hematology: Anemia/thrombocytopenia-  secondary to underlying prostate cancer/chronic disease.  Check iron studies. Worse- recommed PRBC transfusion.  Hemoglobin 7.5.  # Prostatism symptoms: Continue Flomax; stable.   # Diffuse bone metastases/castrate resistant prostate cancer -currently on Zometa every 2 months or so.  Continue  ca+Vit D BID. Corrected calcium is 9.3  #  Weight loss/malnutrition-sec to underlying cancer- s/p evaluation with nutrition-  10 pounds in last 2 months- start zyprexa 5 mg; and increase  to 10 mg qhs if needed.  See above.  # IV access: functional refilled-port flush every 2 to 3 months.  emla cream.   # Vaccination: s/p flu shot; reocmmend COVID; RSV   # eligard q 50M-last jan 17th, 2024 ; [prefers 1-2 pm]; zometa on 11/09/2022  # DISPOSITION:   # HOLD venofer # Iron studies;ferritin- add to today's lab # today HOLD tube draw today- Friday blood 1 unit # follow up in 3 week MD; labs-HOLD tube/port- cbc/cmp; PSA; possible venofer; D-2 possible 1 unit PRBC-  Dr.B      Cammie Sickle, MD 12/21/2022 12:07 PM

## 2022-12-21 NOTE — Progress Notes (Deleted)
[address] [place] OFFICE PROGRESS NOTE  Patient Care Team: Steele Sizer, MD as PCP - General (Family Medicine) Cammie Sickle, MD as Consulting Physician (Oncology) Abbie Sons, MD (Urology)   Cancer Staging  Prostate cancer [facility]) Staging form: Prostate, AJCC 8th Edition - Clinical: Stage IVB (cT2c, cN1, cM1, PSA: 374, Grade Group: 5) - Signed by Cammie Sickle, MD on 12/13/2021 Prostate specific antigen (PSA) range: 20 or greater Histologic grading system: 5 grade system    Oncology History Overview Note  compatible with widespread metastatic prostate cancer, including innumerable sclerotic osseous lesions and extensive lymphadenopathy in the pelvis and retroperitoneum, as detailed above. 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 2. There is also an indeterminate hypovascular lesion in segment 5 of the liver which may represent a metastatic lesion. This could be better characterized with follow-up abdominal MRI with and without IV gadolinium if clinically appropriate. 3. Aortic atherosclerosis with mild fusiform aneurysmal dilatation of the infrarenal abdominal aorta measuring up to 3.2 x 3.0 cm. Recommend follow-up ultrasound every 3 years. This recommendation follows ACR consensus guidelines: White Paper of the ACR Incidental Findings Committee II on Vascular Findings. J Am Coll Radiol 2013; 10:789-794. 4. Bladder wall appears mildly thickened, likely related to bladder outlet obstruction given the enlarged prostate gland. 5. Severe colonic diverticulosis without evidence of acute diverticulitis at this time.  IMPRESSION: There are numerous foci of abnormal tracer uptake in the axial and proximal appendicular skeleton consistent with extensive skeletal metastatic disease.     #Stage IV castrate sensitive prostate cancer-December/20  2023-MRI negative for any liver lesions.; FEB 24th, 2022-Firmagon loading dose;  # march 24th-Taxoeter with udenyca; 4/12- add darolutamide  # DEC 2022- 65 [[place]; Urology]   Prostate cancer ([place])  12/13/2021 Initial Diagnosis   Prostate cancer ([place])   12/13/2021 Cancer Staging   Staging form: Prostate, AJCC 8th Edition - Clinical: Stage IVB (cT2c, cN1, cM1, PSA: 374, Grade Group: 5) - Signed by Cammie Sickle, MD on 12/13/2021 Prostate specific antigen (PSA) range: 20 or greater Histologic grading system: 5 grade system   01/12/2022 - 04/28/2022 Chemotherapy   Patient is on Treatment Plan : PROSTATE Docetaxel + Prednisone q21d      Genetic Testing   Negative genetic testing. No pathogenic variants identified on the [facility] Common Hereditary Cancers +RNA panel. VUS in BRIP1 called c.485G>T identified. The report date is 03/04/2022.  The Common Hereditary Cancers Panel + RNA offered by [facility] includes sequencing and/or deletion duplication testing of the following 47 genes: APC, ATM, AXIN2, BARD1, BMPR1A, BRCA1, BRCA2, BRIP1, CDH1, CDKN2A (p14ARF), CDKN2A (p16INK4a), CKD4, CHEK2, CTNNA1, DICER1, EPCAM (Deletion/duplication testing only), GREM1 (promoter region deletion/duplication testing only), KIT, MEN1, MLH1, MSH2, MSH3, MSH6, MUTYH, NBN, NF1, NHTL1, PALB2, PDGFRA, PMS2, POLD1, POLE, PTEN, RAD50, RAD51C, RAD51D, SDHB, SDHC, SDHD, SMAD4, SMARCA4. STK11, TP53, TSC1, TSC2, and VHL.  The following genes were evaluated for sequence changes only: SDHA and HOXB13 c.251G>A variant only.      HISTORY OF PRESENT ILLNESS: Ambulating with a cane.  With daughter.    Eric [name] 82 y.o.  male pleasant patient above history of castrate resistant metastatic prostate cancer currently currently Eligard plus pluvicto is here for follow-up.    Pt and caregiver in for follow up, pt reports improved appetite on prednisone 40 mg/day.  Denies any concerns today   Patient feels he has increase in  SOBr today.  Shortness of breath is worse with exertion.  Denies any chest pain.  Positive for cough.  Decrease in appetite with 4 lb wt loss.   Complains of moderate to severe fatigue.  No nausea no vomiting.  No sores in the mouth.  Poor appetite.  Review of Systems  Constitutional:  Positive for malaise/fatigue and weight loss. Negative for chills, diaphoresis and fever.  HENT:  Negative for nosebleeds and sore throat.   Eyes:  Negative for double vision.  Respiratory:  Positive for shortness of breath. Negative for cough, hemoptysis, sputum production and wheezing.   Cardiovascular:  Negative for chest pain, palpitations, orthopnea and leg swelling.  Gastrointestinal:  Negative for abdominal pain, blood in stool, constipation, diarrhea, heartburn, melena, nausea and vomiting.  Genitourinary:  Negative for dysuria, frequency and urgency.  Musculoskeletal:  Positive for back pain and joint pain.  Skin: Negative.  Negative for itching and rash.  Neurological:  Negative for dizziness, tingling, focal weakness, weakness and headaches.  Endo/Heme/Allergies:  Does not bruise/bleed easily.  Psychiatric/Behavioral:  Negative for depression. The patient is not nervous/anxious and does not have insomnia.       PAST MEDICAL HISTORY :  Past Medical History:  Diagnosis Date   Cancer ([place])    COPD (chronic obstructive pulmonary disease) ([place])    Dyspnea    with exertion   Family history of cancer    GERD (gastroesophageal reflux disease)    occ   Hyperlipidemia    Hypertension     PAST SURGICAL HISTORY :   Past Surgical History:  Procedure Laterality Date   COLONOSCOPY     HEMORROIDECTOMY     IR IMAGING GUIDED PORT INSERTION  12/20/2021   PROSTATE BIOPSY N/A 10/26/2021   Procedure: PROSTATE BIOPSY;  Surgeon: Abbie Sons, MD;  Location: [facility] ORS;  Service: Urology;  Laterality: N/A;   TRANSRECTAL ULTRASOUND N/A 10/26/2021   Procedure: TRANSRECTAL ULTRASOUND;  Surgeon: Abbie Sons,  MD;  Location: [facility] ORS;  Service: Urology;  Laterality: N/A;    FAMILY HISTORY :   Family History  Problem Relation Age of Onset   Heart disease Mother    COPD Father    Cancer Brother 50       blood cancer; unk exact type   Cancer Maternal Aunt        unk type   Cancer Paternal Aunt        unk type   Cancer Paternal Uncle        one unk type; one had throat   Cancer Cousin        unk types    SOCIAL HISTORY:   Social History   Tobacco Use   Smoking status: Every Day    Packs/day: 0.50    Years: 60.00    Total pack years: 30.00    Types: Cigarettes   Smokeless tobacco: Never   Tobacco comments:    smoking cessation information provided  Vaping Use   Vaping Use: Never used  Substance Use Topics   Alcohol use: Not Currently   Drug use: Not Currently    ALLERGIES:  has No Known Allergies.  MEDICATIONS:  Current Outpatient Medications  Medication Sig Dispense Refill   acetaminophen (TYLENOL) 500 MG tablet Take 1 tablet (500 mg total) by mouth every 6 (six) hours as needed. 30 tablet 0   albuterol (VENTOLIN HFA) 108 (90 Base) MCG/ACT inhaler TAKE 2 PUFFS BY MOUTH EVERY 6 HOURS AS NEEDED FOR WHEEZE OR  SHORTNESS OF BREATH 18 each 5   Ca Carbonate-Mag Hydroxide (ROLAIDS PO) Take 1 tablet by mouth as needed.     darolutamide (NUBEQA) 300 MG tablet Take 2 tablets (600 mg total) by mouth 2 (two) times daily with a meal. 120 tablet 2   lidocaine-prilocaine (EMLA) cream Apply on the port. 30 -45 min  prior to port access. 30 g 3   atorvastatin (LIPITOR) 40 MG tablet Take 1 tablet (40 mg total) by mouth daily. In place of rosuvastatin due to caner treatment (Patient not taking: Reported on 08/10/2022) 90 tablet 1   ezetimibe (ZETIA) 10 MG tablet Take 1 tablet (10 mg total) by mouth every morning. (Patient not taking: Reported on 08/10/2022) 90 tablet 1   Fluticasone-Umeclidin-Vilant (TRELEGY ELLIPTA) 100-62.5-25 MCG/ACT AEPB Inhale 1 puff into the lungs daily. (Patient not  taking: Reported on 08/10/2022) 3 each 1   gabapentin (NEURONTIN) 100 MG capsule Take 1 pill at nighttime for 1 week.  If tolerating well take 2 pills at night. (Patient not taking: Reported on 08/10/2022) 60 capsule 0   metoprolol succinate (TOPROL-XL) 25 MG 24 hr tablet Take 1 tablet (25 mg total) by mouth daily. Take with or immediately following a meal. (Patient not taking: Reported on 08/10/2022) 90 tablet 1   ondansetron (ZOFRAN) 8 MG tablet One pill every 8 hours as needed for nausea/vomitting. (Patient not taking: Reported on 08/10/2022) 40 tablet 1   prochlorperazine (COMPAZINE) 10 MG tablet Take 1 tablet (10 mg total) by mouth every 6 (six) hours as needed for nausea or vomiting. (Patient not taking: Reported on 08/10/2022) 40 tablet 1   tamsulosin (FLOMAX) 0.4 MG CAPS capsule TAKE 1 CAPSULE BY MOUTH EVERY DAY AFTER SUPPER (Patient not taking: Reported on 08/10/2022) 90 capsule 1   No current facility-administered medications for this visit.    PHYSICAL EXAMINATION: ECOG PERFORMANCE STATUS: 1 - Symptomatic but completely ambulatory  BP 125/77   Pulse 92   Temp 97.7 F (36.5 C) (Tympanic)   Resp 16   Wt 150 lb (68 kg)   BMI 24.21 kg/m   Filed Weights   12/21/22 1008  Weight: 139 lb 12.8 oz (63.4 kg)   4-5 weakness noted right upper extremity grip strength.   Physical Exam Vitals and nursing note reviewed.  HENT:     Head: Normocephalic and atraumatic.     Mouth/Throat:     Pharynx: Oropharynx is clear.  Eyes:     Extraocular Movements: Extraocular movements intact.     Pupils: Pupils are equal, round, and reactive to light.  Cardiovascular:     Rate and Rhythm: Normal rate and regular rhythm.  Pulmonary:     Comments: Decreased breath sounds bilaterally.  Abdominal:     Palpations: Abdomen is soft.  Musculoskeletal:        General: Normal range of motion.     Cervical back: Normal range of motion.  Skin:    General: Skin is warm.  Neurological:     General:  No focal deficit present.     Mental Status: He is alert and oriented to person, place, and time.  Psychiatric:        Behavior: Behavior normal.        Judgment: Judgment normal.     LABORATORY DATA:  I have reviewed the data as listed    Component Value Date/Time   NA 137 08/10/2022 1301   NA 142 08/17/2015 0928   K 4.2 08/10/2022 1301   CL 106  08/10/2022 1301   CO2 25 08/10/2022 1301   GLUCOSE 98 08/10/2022 1301   BUN 25 (H) 08/10/2022 1301   BUN 17 08/17/2015 0928   CREATININE 1.07 08/10/2022 1301   CREATININE 1.06 09/30/2021 1119   CALCIUM 8.8 (L) 08/10/2022 1301   PROT 6.8 08/10/2022 1301   PROT 6.8 08/17/2015 0928   ALBUMIN 3.5 08/10/2022 1301   ALBUMIN 4.1 08/17/2015 0928   AST 15 08/10/2022 1301   ALT 9 08/10/2022 1301   ALKPHOS 297 (H) 08/10/2022 1301   BILITOT 0.4 08/10/2022 1301   BILITOT 0.4 08/17/2015 0928   GFRNONAA >60 08/10/2022 1301   GFRNONAA 50 (L) 03/03/2021 1050   GFRAA 59 (L) 03/03/2021 1050    No results found for: "SPEP", "UPEP"  Lab Results  Component Value Date   WBC 6.6 08/10/2022   NEUTROABS 4.1 08/10/2022   HGB 11.5 (L) 08/10/2022   HCT 35.0 (L) 08/10/2022   MCV 100.3 (H) 08/10/2022   PLT 276 08/10/2022      Chemistry      Component Value Date/Time   NA 137 08/10/2022 1301   NA 142 08/17/2015 0928   K 4.2 08/10/2022 1301   CL 106 08/10/2022 1301   CO2 25 08/10/2022 1301   BUN 25 (H) 08/10/2022 1301   BUN 17 08/17/2015 0928   CREATININE 1.07 08/10/2022 1301   CREATININE 1.06 09/30/2021 1119      Component Value Date/Time   CALCIUM 8.8 (L) 08/10/2022 1301   ALKPHOS 297 (H) 08/10/2022 1301   AST 15 08/10/2022 1301   ALT 9 08/10/2022 1301   BILITOT 0.4 08/10/2022 1301   BILITOT 0.4 08/17/2015 [id number]       RADIOGRAPHIC STUDIES: I have personally reviewed the radiological images as listed and agreed with the findings in the report. No results found.   #Stage IV-castrate resistant prostate cancer [OCT 2023] with  multiple bone metastases/extensive lymphadenopathy; Currently s/p cycle # 6 of Taxotere [60 mg per metered square]- finished JULY, 2023. NOV 2023-PET scan shows multiple progressive bone lesions; shows no focal activity in the prostate/abdominal lymph nodes.  PSA rising-concerning for progressive disease.  # Currently on  ADT-Eligard; discontinued darolutamide sec to progression.  Given the progressive disease patient s/p Pluvicto with Dr.Stewart on 1/25.  #2 next March 7th-patient tolerating fairly well.  # Weight loss/ dyspnea/fatigue-likely secondary to progression of disease.  Recommend prednisone 40 mg a day for the next 3 weeks.  Also recommend Zyprexa at nighttime.  # PN- from taxotere- G-2-3; Right UE weakness- S/p evaluation to Dr. Marjo Bicker post MRI of the cervical spine.  Stable.  # Moderate anemia hemoglobin 8-9-likely secondary to underlying prostate cancer/chronic disease.  Monitor for now.  Notice of iron deficiency.  # Prostatism symptoms: Continue Flomax; stable.   # Diffuse bone metastases/castrate resistant prostate cancer -currently on Zometa every 2 months or so.  Continue  ca+Vit D BID. Corrected calcium is 9.3  # Weight loss/malnutrition-sec to underlying cancer- s/p evaluation with nutrition-  10 pounds in last 2 months- start zyprexa 5 mg; and increase to 10 mg qhs if needed.  See above.  # IV access: functional refilled-port flush every 2 to 3 months.  emla cream.   # Vaccination: s/p flu shot; reocmmend COVID; RSV   # eligard q 21M-last jan 17th, 2024 ; [prefers 1-2 pm]; zometa on 11/09/2022  # DISPOSITION:   # follow up in 3 week MD; labs-/port- cbc/cmp; PSA; possible venofer-  Dr.B    Orders Placed  This Encounter  Procedures   NM PET (PSMA) SKULL TO MID THIGH    Standing Status:   Future    Standing Expiration Date:   08/11/2023    Order Specific Question:   If indicated for the ordered procedure, I authorize the administration of a radiopharmaceutical  per Radiology protocol    Answer:   Yes    Order Specific Question:   Preferred imaging location?    Answer:   [facility]   MR BRAIN W WO CONTRAST    Standing Status:   Future    Standing Expiration Date:   08/11/2023    Order Specific Question:   If indicated for the ordered procedure, I authorize the administration of contrast media per Radiology protocol    Answer:   Yes    Order Specific Question:   What is the patient's sedation requirement?    Answer:   No Sedation    Order Specific Question:   Does the patient have a pacemaker or implanted devices?    Answer:   No    Order Specific Question:   Use SRS Protocol?    Answer:   No    Order Specific Question:   Preferred imaging location?    Answer:   [facility] (table limit-350lbs)   Amb Referral to Neuro Oncology    Referral Priority:   Routine    Referral Type:   Consultation    Referral Reason:   Specialty Services Required    Number of Visits Requested:   1   All questions were answered. The patient knows to call the clinic with any problems, questions or concerns.      Cammie Sickle, MD 12/21/2022 11:18 AM

## 2022-12-21 NOTE — Assessment & Plan Note (Addendum)
#  Stage IV-castrate resistant prostate cancer [OCT 2023] with multiple bone metastases/extensive lymphadenopathy; Currently s/p cycle # 6 of Taxotere [60 mg per metered square]- finished JULY, 2023. NOV 2023-PET scan shows multiple progressive bone lesions; shows no focal activity in the prostate/abdominal lymph nodes.  PSA rising-concerning for progressive disease.  # Currently on  ADT-Eligard; discontinued darolutamide sec to progression.  Given the progressive disease patient s/p Pluvicto q 6 W- with Dr.Stewart on 1/25.  #2 next March 7th-patient tolerating fairly well.  # Weight loss/ dyspnea/fatigue-likely secondary to progression of disease.  Recommend taper prednisone 20 mg a day for the next 3 weeks.  Also recommend Zyprexa at nighttime.  # PN- from taxotere- G-2-3; Right UE weakness- S/p evaluation to Dr. Marjo Bicker post MRI of the cervical spine.  Stable.  # Hematology: Anemia/thrombocytopenia-  secondary to underlying prostate cancer/chronic disease.  Check iron studies. Worse- recommed PRBC transfusion.  Hemoglobin 7.5.  # Prostatism symptoms: Continue Flomax; stable.   # Diffuse bone metastases/castrate resistant prostate cancer -currently on Zometa every 2 months or so.  Continue  ca+Vit D BID. Corrected calcium is 9.3  # Weight loss/malnutrition-sec to underlying cancer- s/p evaluation with nutrition-  10 pounds in last 2 months- start zyprexa 5 mg; and increase to 10 mg qhs if needed.  See above.  # IV access: functional refilled-port flush every 2 to 3 months.  emla cream.   # Vaccination: s/p flu shot; reocmmend COVID; RSV   # eligard q 59M-last jan 17th, 2024 ; [prefers 1-2 pm]; zometa on 11/09/2022  # DISPOSITION:   # HOLD venofer # Iron studies;ferritin- add to today's lab # today HOLD tube draw today- Friday blood 1 unit # follow up in 3 week MD; labs-HOLD tube/port- cbc/cmp; PSA; possible venofer; D-2 possible 1 unit PRBC-  Dr.B

## 2022-12-23 ENCOUNTER — Inpatient Hospital Stay: Payer: Medicare HMO | Attending: Internal Medicine

## 2022-12-23 DIAGNOSIS — C61 Malignant neoplasm of prostate: Secondary | ICD-10-CM | POA: Insufficient documentation

## 2022-12-23 DIAGNOSIS — R634 Abnormal weight loss: Secondary | ICD-10-CM | POA: Insufficient documentation

## 2022-12-23 DIAGNOSIS — D6959 Other secondary thrombocytopenia: Secondary | ICD-10-CM | POA: Insufficient documentation

## 2022-12-23 DIAGNOSIS — Z7952 Long term (current) use of systemic steroids: Secondary | ICD-10-CM | POA: Diagnosis not present

## 2022-12-23 DIAGNOSIS — F17210 Nicotine dependence, cigarettes, uncomplicated: Secondary | ICD-10-CM | POA: Insufficient documentation

## 2022-12-23 DIAGNOSIS — I10 Essential (primary) hypertension: Secondary | ICD-10-CM | POA: Insufficient documentation

## 2022-12-23 DIAGNOSIS — C7951 Secondary malignant neoplasm of bone: Secondary | ICD-10-CM | POA: Insufficient documentation

## 2022-12-23 DIAGNOSIS — D649 Anemia, unspecified: Secondary | ICD-10-CM

## 2022-12-23 DIAGNOSIS — D630 Anemia in neoplastic disease: Secondary | ICD-10-CM | POA: Diagnosis not present

## 2022-12-23 MED ORDER — SODIUM CHLORIDE 0.9% FLUSH
10.0000 mL | INTRAVENOUS | Status: AC | PRN
  Administered 2022-12-23: 10 mL
  Filled 2022-12-23: qty 10

## 2022-12-23 MED ORDER — ACETAMINOPHEN 325 MG PO TABS
650.0000 mg | ORAL_TABLET | Freq: Once | ORAL | Status: AC
  Administered 2022-12-23: 650 mg via ORAL
  Filled 2022-12-23: qty 2

## 2022-12-23 MED ORDER — SODIUM CHLORIDE 0.9% IV SOLUTION
250.0000 mL | Freq: Once | INTRAVENOUS | Status: AC
  Administered 2022-12-23: 250 mL via INTRAVENOUS
  Filled 2022-12-23: qty 250

## 2022-12-23 MED ORDER — HEPARIN SOD (PORK) LOCK FLUSH 100 UNIT/ML IV SOLN
500.0000 [IU] | Freq: Every day | INTRAVENOUS | Status: AC | PRN
  Administered 2022-12-23: 500 [IU]
  Filled 2022-12-23: qty 5

## 2022-12-23 NOTE — Patient Instructions (Signed)

## 2022-12-24 LAB — TYPE AND SCREEN
ABO/RH(D): O POS
Antibody Screen: NEGATIVE
Unit division: 0

## 2022-12-24 LAB — BPAM RBC
Blood Product Expiration Date: 202403312359
ISSUE DATE / TIME: 202403010856
Unit Type and Rh: 5100

## 2022-12-28 NOTE — Written Directive (Addendum)
MOLECULAR IMAGING AND THERAPEUTICS WRITTEN DIRECTIVE   PATIENT NAME: Eric [name]  PT DOB:   [birth year]                                              MRN: [id number]  ---------------------------------------------------------------------------------------------------------------------   PLUVICTO  THERAPY   RADIOPHARMACEUTICAL: Lutetium 177 vipivotide tetraxetan (Pluvicto)     PRESCRIBED DOSE FOR ADMINISTRATION:  160  mCi   ROUTE OFADMINISTRATION:  IV   DIAGNOSIS:  Prostate cancer   REFERRING PHYSICIAN: Dr. Louretta Shorten   TREATMENT #: 2   ADDITIONAL PHYSICIAN COMMENTS/NOTES: Dose reduced by 20% secondary to potential myelosuppression.   Hgb 7 and platelets reduced to 100K from 200K. Transfusion of RBC prior to second treatment.     AUTHORIZED USER SIGNATURE & TIME STAMP: Patriciaann Clan, MD   12/28/22    8:19 PM

## 2022-12-29 ENCOUNTER — Ambulatory Visit (HOSPITAL_COMMUNITY)
Admission: RE | Admit: 2022-12-29 | Discharge: 2022-12-29 | Disposition: A | Discharge status: Home / Self Care | Payer: Medicare HMO | Source: Ambulatory Visit | Attending: Internal Medicine | Admitting: Internal Medicine

## 2022-12-29 DIAGNOSIS — C61 Malignant neoplasm of prostate: Secondary | ICD-10-CM | POA: Diagnosis present

## 2022-12-29 MED ORDER — SODIUM CHLORIDE 0.9 % IV BOLUS: 1000.0000 mL | Freq: Once | INTRAVENOUS | Status: DC

## 2022-12-29 MED ORDER — SODIUM CHLORIDE 0.9 % IV SOLN: INTRAVENOUS | Status: AC

## 2022-12-29 MED ORDER — LUTETIUM LU 177 VIPIVOTIDE TET 1000 MBQ/ML IV SOLN
165.7000 | Freq: Once | INTRAVENOUS | Status: AC
  Administered 2022-12-29: 165.7 via INTRAVENOUS

## 2022-12-29 NOTE — Progress Notes (Signed)
CLINICAL DATA: [82 year old male with metastatic prostate carcinoma.]  Extensive PSMA positive metastatic skeletal disease.   EXAM: NUCLEAR MEDICINE PLUVICTO INJECTION  TECHNIQUE: Infusion: The nuclear medicine technologist and I personally verified the dose activity to be delivered as specified in the written directive, and verified the patient identification via 2 separate methods.  Initial flush of the intravenous catheter was performed was sterile saline. The dose syringe was connected to the catheter and the Lu-177 Pluvicto administered over a 1 to 10 min infusion. Single 10 cc  lushes with normal saline follow the dose. No complications were noted. The entire IV tubing, venocatheter, stopcock and syringes was removed in total, placed in a disposal bag and sent for assay of the residual activity, which will be reported at a later time in our EMR by the physics staff. Pressure was applied to the venipuncture site, and a compression bandage placed. Patient monitored for 1 hour following infusion.    Radiation Safety personnel were present to perform the discharge survey, as detailed on their documentation. After a short period of observation, the patient had his IV removed.  RADIOPHARMACEUTICALS: [One hundred sixty 5.7] microcuries Lu-177 PLUVICTO  FINDINGS: Current Infusion: [2]  Planned Infusions: 6  Patient presented to nuclear medicine for treatment.  Patient reports some fatigue.  No adverse effects from initial treatment.    The patient's most recent blood counts were reviewed and remains a attic with candidate to proceed with Lu-177 Pluvicto.    Patient's hemoglobin has declined 7.5.  Patient's platelets drop from 200k to 10ok.  No neutropenia.    Patient had a significant drop in PSA but remains elevated 1,200.  Alkaline phosphatase significantly increased.    Decision to administered 20% reduced dose due to mild marrow suppression as described above.    Risk and  benefits of proceeding with reduced does explained to patient and granddaughter.    The patient was situated in an infusion suite with a contact barrier placed under the arm. Intravenous access was established, using sterile technique, and a normal saline infusion from a syringe was started.     Micro-dosimetry: The prescribed radiation activity was assayed and confirmed to be within specified tolerance.  IMPRESSION: Current Infusion: [2]  Planned Infusions: 6    [The patient tolerated the infusion well. The patient will return in 6 weeks  for ongoing care.]

## 2023-01-09 ENCOUNTER — Inpatient Hospital Stay (HOSPITAL_BASED_OUTPATIENT_CLINIC_OR_DEPARTMENT_OTHER): Payer: Medicare HMO | Admitting: Internal Medicine

## 2023-01-09 ENCOUNTER — Inpatient Hospital Stay: Payer: Medicare HMO

## 2023-01-09 ENCOUNTER — Encounter: Payer: Self-pay | Admitting: Internal Medicine

## 2023-01-09 VITALS — BP 161/84 | HR 89 | Temp 96.1°F | Resp 16 | Wt 144.0 lb

## 2023-01-09 DIAGNOSIS — D630 Anemia in neoplastic disease: Secondary | ICD-10-CM

## 2023-01-09 DIAGNOSIS — R5383 Other fatigue: Secondary | ICD-10-CM

## 2023-01-09 DIAGNOSIS — I10 Essential (primary) hypertension: Secondary | ICD-10-CM

## 2023-01-09 DIAGNOSIS — C61 Malignant neoplasm of prostate: Secondary | ICD-10-CM

## 2023-01-09 DIAGNOSIS — D696 Thrombocytopenia, unspecified: Secondary | ICD-10-CM | POA: Diagnosis not present

## 2023-01-09 DIAGNOSIS — C7951 Secondary malignant neoplasm of bone: Secondary | ICD-10-CM

## 2023-01-09 DIAGNOSIS — Z800 Family history of malignant neoplasm of digestive organs: Secondary | ICD-10-CM

## 2023-01-09 DIAGNOSIS — D649 Anemia, unspecified: Secondary | ICD-10-CM

## 2023-01-09 DIAGNOSIS — R634 Abnormal weight loss: Secondary | ICD-10-CM

## 2023-01-09 DIAGNOSIS — F17210 Nicotine dependence, cigarettes, uncomplicated: Secondary | ICD-10-CM

## 2023-01-09 LAB — CBC WITH DIFFERENTIAL (CANCER CENTER ONLY)
Abs Immature Granulocytes: 0.08 10*3/uL — ABNORMAL HIGH (ref 0.00–0.07)
Basophils Absolute: 0 10*3/uL (ref 0.0–0.1)
Basophils Relative: 0 %
Eosinophils Absolute: 0 10*3/uL (ref 0.0–0.5)
Eosinophils Relative: 0 %
HCT: 28.6 % — ABNORMAL LOW (ref 39.0–52.0)
Hemoglobin: 8.9 g/dL — ABNORMAL LOW (ref 13.0–17.0)
Immature Granulocytes: 1 %
Lymphocytes Relative: 15 %
Lymphs Abs: 0.8 10*3/uL (ref 0.7–4.0)
MCH: 33.3 pg (ref 26.0–34.0)
MCHC: 31.1 g/dL (ref 30.0–36.0)
MCV: 107.1 fL — ABNORMAL HIGH (ref 80.0–100.0)
Monocytes Absolute: 0.5 10*3/uL (ref 0.1–1.0)
Monocytes Relative: 9 %
Neutro Abs: 4.2 10*3/uL (ref 1.7–7.7)
Neutrophils Relative %: 75 %
Platelet Count: 155 10*3/uL (ref 150–400)
RBC: 2.67 MIL/uL — ABNORMAL LOW (ref 4.22–5.81)
RDW: 21.5 % — ABNORMAL HIGH (ref 11.5–15.5)
WBC Count: 5.6 10*3/uL (ref 4.0–10.5)
nRBC: 0.4 % — ABNORMAL HIGH (ref 0.0–0.2)

## 2023-01-09 LAB — CMP (CANCER CENTER ONLY)
ALT: 10 U/L (ref 0–44)
AST: 15 U/L (ref 15–41)
Albumin: 3.2 g/dL — ABNORMAL LOW (ref 3.5–5.0)
Alkaline Phosphatase: 800 U/L — ABNORMAL HIGH (ref 38–126)
Anion gap: 6 (ref 5–15)
BUN: 12 mg/dL (ref 8–23)
CO2: 21 mmol/L — ABNORMAL LOW (ref 22–32)
Calcium: 8.3 mg/dL — ABNORMAL LOW (ref 8.9–10.3)
Chloride: 111 mmol/L (ref 98–111)
Creatinine: 0.8 mg/dL (ref 0.61–1.24)
GFR, Estimated: >60 mL/min (ref >60)
Glucose, Bld: 123 mg/dL — ABNORMAL HIGH (ref 70–99)
Potassium: 3.8 mmol/L (ref 3.5–5.1)
Sodium: 138 mmol/L (ref 135–145)
Total Bilirubin: 0.4 mg/dL (ref 0.3–1.2)
Total Protein: 6.2 g/dL — ABNORMAL LOW (ref 6.5–8.1)

## 2023-01-09 LAB — PSA: Prostatic Specific Antigen: 1027 ng/mL — ABNORMAL HIGH (ref 0.00–4.00)

## 2023-01-09 LAB — SAMPLE TO BLOOD BANK

## 2023-01-09 NOTE — Progress Notes (Signed)
[facility] CONSULT NOTE  Patient Care Team: Steele Sizer, MD as PCP - General (Family Medicine) Cammie Sickle, MD as Consulting Physician (Oncology) Abbie Sons, MD (Urology)  CHIEF COMPLAINTS/PURPOSE OF CONSULTATION: prostate cancer  Oncology History Overview Note  compatible with widespread metastatic prostate cancer, including innumerable sclerotic osseous lesions and extensive lymphadenopathy in the pelvis and retroperitoneum, as detailed above. 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 2. There is also an indeterminate hypovascular lesion in segment 5 of the liver which may represent a metastatic lesion. This could be better characterized with follow-up abdominal MRI with and without IV gadolinium if clinically appropriate. 3. Aortic atherosclerosis with mild fusiform aneurysmal dilatation of the infrarenal abdominal aorta measuring up to 3.2 x 3.0 cm. Recommend follow-up ultrasound every 3 years. This recommendation follows ACR consensus guidelines: White Paper of the ACR Incidental Findings Committee II on Vascular Findings. J Am Coll Radiol 2013; 10:789-794. 4. Bladder wall appears mildly thickened, likely related to bladder outlet obstruction given the enlarged prostate gland. 5. Severe colonic diverticulosis without evidence of acute diverticulitis at this time.  IMPRESSION: There are numerous foci of abnormal tracer uptake in the axial and proximal appendicular skeleton consistent with extensive skeletal metastatic disease.     #Stage IV castrate sensitive prostate cancer-December/20 2023-MRI negative for any liver lesions.; FEB 24th, 2022-Firmagon loading dose;  # march 24th-Taxoeter with udenyca; 4/12- add darolutamide  # DEC 2022- 23 [[place]; Urology]   Prostate cancer ([place])  12/13/2021 Initial Diagnosis   Prostate  cancer ([place])   12/13/2021 Cancer Staging   Staging form: Prostate, AJCC 8th Edition - Clinical: Stage IVB (cT2c, cN1, cM1, PSA: 374, Grade Group: 5) - Signed by Cammie Sickle, MD on 12/13/2021 Prostate specific antigen (PSA) range: 20 or greater Histologic grading system: 5 grade system   01/12/2022 - 04/28/2022 Chemotherapy   Patient is on Treatment Plan : PROSTATE Docetaxel + Prednisone q21d      Genetic Testing   Negative genetic testing. No pathogenic variants identified on the [facility] Common Hereditary Cancers +RNA panel. VUS in BRIP1 called c.485G>T identified. The report date is 03/04/2022.  The Common Hereditary Cancers Panel + RNA offered by [facility] includes sequencing and/or deletion duplication testing of the following 47 genes: APC, ATM, AXIN2, BARD1, BMPR1A, BRCA1, BRCA2, BRIP1, CDH1, CDKN2A (p14ARF), CDKN2A (p16INK4a), CKD4, CHEK2, CTNNA1, DICER1, EPCAM (Deletion/duplication testing only), GREM1 (promoter region deletion/duplication testing only), KIT, MEN1, MLH1, MSH2, MSH3, MSH6, MUTYH, NBN, NF1, NHTL1, PALB2, PDGFRA, PMS2, POLD1, POLE, PTEN, RAD50, RAD51C, RAD51D, SDHB, SDHC, SDHD, SMAD4, SMARCA4. STK11, TP53, TSC1, TSC2, and VHL.  The following genes were evaluated for sequence changes only: SDHA and HOXB13 c.251G>A variant only.    HISTORY OF PRESENTING ILLNESS: Patient ambulating with a  cane.   Accompanied by grand-daughter.   [name] 82 y.o.  male pleasant patient above history of castrate resistant metastatic prostate cancer currently currently Eligard plus pluvicto is here for follow-up.    In the interim patient received cycle #2 on March 7.  Patient denies new problems/concerns today. Pt and caregiver in for follow up, pt reports improved appetite on prednisone 20  mg/day.  Denies any concerns today.  Appetite is improving.  Patient continues to complains of moderate to severe fatigue.  No nausea no vomiting.  No sores in the mouth.  Denies any blood in  stools or black or stools.  Review of Systems  Constitutional:  Positive for malaise/fatigue and weight loss. Negative for chills, diaphoresis and fever.  HENT:  Negative for nosebleeds and sore throat.   Eyes:  Negative for double vision.  Respiratory:  Positive for shortness of breath. Negative for cough, hemoptysis, sputum production and wheezing.   Cardiovascular:  Negative for chest pain, palpitations, orthopnea and leg swelling.  Gastrointestinal:  Negative for abdominal pain, blood in stool, constipation, diarrhea, heartburn, melena, nausea and vomiting.  Genitourinary:  Negative for dysuria, frequency and urgency.  Musculoskeletal:  Positive for back pain and joint pain.  Skin: Negative.  Negative for itching and rash.  Neurological:  Negative for dizziness, tingling, focal weakness, weakness and headaches.  Endo/Heme/Allergies:  Does not bruise/bleed easily.  Psychiatric/Behavioral:  Negative for depression. The patient is not nervous/anxious and does not have insomnia.     MEDICAL HISTORY:  Past Medical History:  Diagnosis Date   Cancer ([place])    COPD (chronic obstructive pulmonary disease) ([place])    Dyspnea    with exertion   Family history of cancer    GERD (gastroesophageal reflux disease)    occ   Hyperlipidemia    Hypertension     SURGICAL HISTORY: Past Surgical History:  Procedure Laterality Date   COLONOSCOPY     HEMORROIDECTOMY     IR IMAGING GUIDED PORT INSERTION  12/20/2021   PROSTATE BIOPSY N/A 10/26/2021   Procedure: PROSTATE BIOPSY;  Surgeon: Abbie Sons, MD;  Location: [facility] ORS;  Service: Urology;  Laterality: N/A;   TRANSRECTAL ULTRASOUND N/A 10/26/2021   Procedure: TRANSRECTAL ULTRASOUND;  Surgeon: Abbie Sons, MD;  Location: [facility] ORS;  Service: Urology;  Laterality: N/A;    SOCIAL HISTORY: Social History   Socioeconomic History   Marital status: Single    Spouse name: Not on file   Number of children: 1   Years of education: Not on file    Highest education level: High school graduate  Occupational History   Occupation: retired   Tobacco Use   Smoking status: Every Day    Packs/day: 0.50    Years: 60.00    Additional pack years: 0.00    Total pack years: 30.00    Types: Cigarettes   Smokeless tobacco: Never   Tobacco comments:    smoking cessation information provided  Vaping Use   Vaping Use: Never used  Substance and Sexual Activity   Alcohol use: Not Currently   Drug use: Not Currently   Sexual activity: Not Currently  Other Topics Concern   Not on file  Social History Narrative   Single, lives with male friend Abbott Pao is care giver] for the past 20 plus years. 1/2 ppd smoke; no alcohol. 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 Daughter lives 10-15 mins; in [place].    Social Determinants of Health   Financial Resource Strain: Low Risk  (02/10/2022)   Overall Financial Resource Strain (CARDIA)    Difficulty of Paying Living Expenses: Not hard at all  Food Insecurity: No Food Insecurity (02/10/2022)   Hunger Vital Sign    Worried About Running Out of Food in the Last Year: Never true    Ran Out of Food in the Last Year: Never true  Transportation Needs: No Transportation Needs (02/10/2022)   PRAPARE - Hydrologist (Medical): No    Lack of Transportation (Non-Medical): No  Physical Activity: Inactive (02/10/2022)   Exercise Vital Sign    Days of Exercise per  Week: 0 days    Minutes of Exercise per Session: 0 min  Stress: No Stress Concern Present (02/10/2022)   [place]    Feeling of Stress : Only a little  Social Connections: Socially Isolated (02/10/2022)   Social Connection and Isolation Panel [NHANES]    Frequency of Communication with Friends and Family: More than three times a week    Frequency of Social Gatherings with Friends and Family: Three times a week    Attends Religious Services:  Never    Active Member of Clubs or Organizations: No    Attends Archivist Meetings: Never    Marital Status: Widowed  Intimate Partner Violence: Not At Risk (02/10/2022)   Humiliation, Afraid, Rape, and Kick questionnaire    Fear of Current or Ex-Partner: No    Emotionally Abused: No    Physically Abused: No    Sexually Abused: No    FAMILY HISTORY: Family History  Problem Relation Age of Onset   Heart disease Mother    COPD Father    Cancer Brother 30       blood cancer; unk exact type   Cancer Maternal Aunt        unk type   Cancer Paternal Aunt        unk type   Cancer Paternal Uncle        one unk type; one had throat   Cancer Cousin        unk types    ALLERGIES:  has No Known Allergies.  MEDICATIONS:  Current Outpatient Medications  Medication Sig Dispense Refill   acetaminophen (TYLENOL) 500 MG tablet Take 1 tablet (500 mg total) by mouth every 6 (six) hours as needed. 30 tablet 0   albuterol (VENTOLIN HFA) 108 (90 Base) MCG/ACT inhaler TAKE 2 PUFFS BY MOUTH EVERY 6 HOURS AS NEEDED FOR WHEEZE OR SHORTNESS OF BREATH 18 each 5   atorvastatin (LIPITOR) 40 MG tablet TAKE 1 TABLET (40 MG TOTAL) BY MOUTH DAILY. IN PLACE OF ROSUVASTATIN DUE TO CANER TREATMENT 90 tablet 1   Ca Carbonate-Mag Hydroxide (ROLAIDS PO) Take 1 tablet by mouth as needed.     ezetimibe (ZETIA) 10 MG tablet Take 1 tablet (10 mg total) by mouth every morning. 90 tablet 1   Fluticasone-Umeclidin-Vilant (TRELEGY ELLIPTA) 100-62.5-25 MCG/ACT AEPB Inhale 1 puff into the lungs daily. 3 each 1   lidocaine-prilocaine (EMLA) cream Apply on the port. 30 -45 min  prior to port access. 30 g 3   lisinopril (ZESTRIL) 2.5 MG tablet Take by mouth.     metoprolol succinate (TOPROL-XL) 25 MG 24 hr tablet Take 1 tablet (25 mg total) by mouth daily. Take with or immediately following a meal. 90 tablet 1   OLANZapine (ZYPREXA) 5 MG tablet TAKE 1 TABLET BY MOUTH EVERYDAY AT BEDTIME 90 tablet 2   ondansetron  (ZOFRAN) 8 MG tablet One pill every 8 hours as needed for nausea/vomitting. 40 tablet 1   prochlorperazine (COMPAZINE) 10 MG tablet Take 1 tablet (10 mg total) by mouth every 6 (six) hours as needed for nausea or vomiting. 40 tablet 1   tamsulosin (FLOMAX) 0.4 MG CAPS capsule TAKE 1 CAPSULE BY MOUTH EVERY DAY AFTER SUPPER 90 capsule 1   darolutamide (NUBEQA) 300 MG tablet Take 2 tablets (600 mg total) by mouth 2 (two) times daily with a meal. (Patient not taking: Reported on 11/09/2022) 120 tablet 2   gabapentin (NEURONTIN) 100 MG capsule  Take 1 pill at nighttime for 1 week.  If tolerating well take 2 pills at night. (Patient not taking: Reported on 08/10/2022) 60 capsule 0   predniSONE (DELTASONE) 20 MG tablet Take 2 tablets (40 mg total) by mouth daily with breakfast. 60 tablet 0   No current facility-administered medications for this visit.    PHYSICAL EXAMINATION:   Vitals:   01/09/23 1000  BP: (!) 161/84  Pulse: 89  Resp: 16  Temp: (!) 96.1 F (35.6 C)   Filed Weights   01/09/23 1000  Weight: 144 lb (65.3 kg)    Physical Exam Vitals and nursing note reviewed.  HENT:     Head: Normocephalic and atraumatic.     Mouth/Throat:     Pharynx: Oropharynx is clear.  Eyes:     Extraocular Movements: Extraocular movements intact.     Pupils: Pupils are equal, round, and reactive to light.  Cardiovascular:     Rate and Rhythm: Normal rate and regular rhythm.  Pulmonary:     Comments: Decreased breath sounds bilaterally.  Abdominal:     Palpations: Abdomen is soft.  Musculoskeletal:        General: Normal range of motion.     Cervical back: Normal range of motion.  Skin:    General: Skin is warm.  Neurological:     General: No focal deficit present.     Mental Status: He is alert and oriented to person, place, and time.  Psychiatric:        Behavior: Behavior normal.        Judgment: Judgment normal.     LABORATORY DATA:  I have reviewed the data as listed Lab Results   Component Value Date   WBC 5.6 01/09/2023   HGB 8.9 (L) 01/09/2023   HCT 28.6 (L) 01/09/2023   MCV 107.1 (H) 01/09/2023   PLT 155 01/09/2023   Recent Labs    11/28/22 1457 12/21/22 0945 01/09/23 0937  NA 132* 140 138  K 4.5 4.6 3.8  CL 104 108 111  CO2 19* 22 21*  GLUCOSE 113* 102* 123*  BUN 16 23 12   CREATININE 1.15 0.87 0.80  CALCIUM 8.1* 8.3* 8.3*  GFRNONAA >60 >60 >60  PROT 6.6 6.3* 6.2*  ALBUMIN 2.9* 3.2* 3.2*  AST 25 19 15   ALT 11 13 10   ALKPHOS 632* 1,134* 800*  BILITOT 0.5 0.4 0.4    RADIOGRAPHIC STUDIES: I have personally reviewed the radiological images as listed and agreed with the findings in the report. NM PLUVICTO ADMINISTRATION  Result Date: 12/29/2022 CLINICAL DATA:  82 year old male with metastatic prostate carcinoma. Extensive PSMA positive metastatic skeletal disease. EXAM: NUCLEAR MEDICINE PLUVICTO INJECTION TECHNIQUE: Infusion: The nuclear medicine technologist and I personally verified the dose activity to be delivered as specified in the written directive, and verified the patient identification via 2 separate methods. Initial flush of the intravenous catheter was performed was sterile saline. The dose syringe was connected to the catheter and the Lu-177 Pluvicto administered over a 1 to 10 min infusion. Single 10 cc lushes with normal saline follow the dose. No complications were noted. The entire IV tubing, venocatheter, stopcock and syringes was removed in total, placed in a disposal bag and sent for assay of the residual activity, which will be reported at a later time in our EMR by the physics staff. Pressure was applied to the venipuncture site, and a compression bandage placed. Patient monitored for 1 hour following infusion. Radiation Safety personnel were present to  perform the discharge survey, as detailed on their documentation. After a short period of observation, the patient had his IV removed. RADIOPHARMACEUTICALS:  One hundred sixty 5.7  microcuries Lu-177 PLUVICTO FINDINGS: Current Infusion: 2 Planned Infusions: 6 Patient presented to nuclear medicine for treatment. Patient reports some fatigue. No adverse effects from initial treatment. The patient's most recent blood counts were reviewed and remains a attic with candidate to proceed with Lu-177 Pluvicto. Patient's hemoglobin has declined 7.5. Patient's platelets drop from 200k to 10ok. No neutropenia. Patient has a significant drop in PSA but remains elevated 1,200. Alkaline phosphatase significantly increased. Decision to administered 20% reduced dose due to mild marrow suppression as described above. Risk and benefits of proceeding with reduced does explained to patient and granddaughter. The patient was situated in an infusion suite with a contact barrier placed under the arm. Intravenous access was established, using sterile technique, and a normal saline infusion from a syringe was started. Micro-dosimetry: The prescribed radiation activity was assayed and confirmed to be within specified tolerance. IMPRESSION: Current Infusion: 2 Planned Infusions: 6 The patient tolerated the infusion well. The patient will return in 6 weeks for ongoing care. Electronically Signed   By: Suzy Bouchard M.D.   On: 12/29/2022 13:37     Prostate cancer ([place]) #Stage IV-castrate resistant prostate cancer [OCT 2023] with multiple bone metastases/extensive lymphadenopathy; Currently s/p cycle # 6 of Taxotere [60 mg per metered square]- finished JULY, 2023. NOV 2023-PET scan shows multiple progressive bone lesions; shows no focal activity in the prostate/abdominal lymph nodes.  PSA rising-concerning for progressive disease.  # Currently on  ADT-Eligard; discontinued darolutamide sec to progression.  Given the progressive disease patient s/p Pluvicto q 6 W- with Dr.Stewart on 1/25.  #3 next April 18th-patient tolerating fairly well.  # Weight loss/ dyspnea/fatigue-likely secondary to progression of disease.   Continue recommend Zyprexa at nighttime. Taper 20 mg/day; stop in 3-4 days.   # PN- from taxotere- G-2-3; Right UE weakness- S/p evaluation to Dr. Marjo Bicker post MRI of the cervical spine. stable  # Hematology: Anemia/thrombocytopenia-  secondary to underlying prostate cancer/chronic disease.  FEB 2024- No Iron def;  Prn PRBC transfusion.  Hemoglobin  8.5 today- HOLD blood.   # Prostatism symptoms: Continue Flomax; stable.   # Diffuse bone metastases/castrate resistant prostate cancer -currently on Zometa every 2 months or so.  Continue  ca+Vit D BID. Corrected calcium is 9.3  # Weight loss/malnutrition-sec to underlying cancer- s/p evaluation with nutrition-  10 pounds in last 2 months- start zyprexa 5 mg; and increase to 10 mg qhs if needed.  See above.  # IV access: functional refilled-port flush every 2 to 3 months.  emla cream.   # Vaccination: s/p flu shot; reocmmend COVID; RSV   # eligard q 71M-last jan 17th, 2024 ; [prefers 1-2 pm]; zometa on 11/09/2022  # DISPOSITION:   # HOLD venofer;   # cancel Blood tomorrow  # follow up in 3 week MD; labs-HOLD tube/port- cbc/cmp; PSA; possible zometa; D-2 possible 1 unit PRBC-  Dr.B      Cammie Sickle, MD 01/09/2023 10:59 AM

## 2023-01-09 NOTE — Progress Notes (Signed)
Patient denies new problems/concerns today.   °

## 2023-01-09 NOTE — Assessment & Plan Note (Signed)
#  Stage IV-castrate resistant prostate cancer [OCT 2023] with multiple bone metastases/extensive lymphadenopathy; Currently s/p cycle # 6 of Taxotere [60 mg per metered square]- finished JULY, 2023. NOV 2023-PET scan shows multiple progressive bone lesions; shows no focal activity in the prostate/abdominal lymph nodes.  PSA rising-concerning for progressive disease.  # Currently on  ADT-Eligard; discontinued darolutamide sec to progression.  Given the progressive disease patient s/p Pluvicto q 6 W- with Dr.Stewart on 1/25.  #3 next April 18th-patient tolerating fairly well.  # Weight loss/ dyspnea/fatigue-likely secondary to progression of disease.  Continue recommend Zyprexa at nighttime. Taper 20 mg/day; stop in 3-4 days.   # PN- from taxotere- G-2-3; Right UE weakness- S/p evaluation to Dr. Marjo Bicker post MRI of the cervical spine. stable  # Hematology: Anemia/thrombocytopenia-  secondary to underlying prostate cancer/chronic disease.  FEB 2024- No Iron def;  Prn PRBC transfusion.  Hemoglobin  8.5 today- HOLD blood.   # Prostatism symptoms: Continue Flomax; stable.   # Diffuse bone metastases/castrate resistant prostate cancer -currently on Zometa every 2 months or so.  Continue  ca+Vit D BID. Corrected calcium is 9.3  # Weight loss/malnutrition-sec to underlying cancer- s/p evaluation with nutrition-  10 pounds in last 2 months- start zyprexa 5 mg; and increase to 10 mg qhs if needed.  See above.  # IV access: functional refilled-port flush every 2 to 3 months.  emla cream.   # Vaccination: s/p flu shot; reocmmend COVID; RSV   # eligard q 28M-last jan 17th, 2024 ; [prefers 1-2 pm]; zometa on 11/09/2022  # DISPOSITION:   # HOLD venofer;   # cancel Blood tomorrow  # follow up in 3 week MD; labs-HOLD tube/port- cbc/cmp; PSA; possible zometa; D-2 possible 1 unit PRBC-  Dr.B

## 2023-01-10 ENCOUNTER — Ambulatory Visit: Payer: Medicare HMO

## 2023-01-10 ENCOUNTER — Inpatient Hospital Stay: Payer: Medicare HMO

## 2023-01-30 ENCOUNTER — Telehealth: Payer: Self-pay | Admitting: *Deleted

## 2023-01-30 ENCOUNTER — Inpatient Hospital Stay: Payer: Medicare HMO | Attending: Internal Medicine

## 2023-01-30 ENCOUNTER — Encounter: Payer: Self-pay | Admitting: Internal Medicine

## 2023-01-30 ENCOUNTER — Inpatient Hospital Stay (HOSPITAL_BASED_OUTPATIENT_CLINIC_OR_DEPARTMENT_OTHER): Payer: Medicare HMO | Admitting: Internal Medicine

## 2023-01-30 VITALS — BP 119/50 | HR 96 | Resp 18 | Ht 66.0 in | Wt 141.0 lb

## 2023-01-30 DIAGNOSIS — E46 Unspecified protein-calorie malnutrition: Secondary | ICD-10-CM | POA: Insufficient documentation

## 2023-01-30 DIAGNOSIS — M898X9 Other specified disorders of bone, unspecified site: Secondary | ICD-10-CM | POA: Diagnosis not present

## 2023-01-30 DIAGNOSIS — C61 Malignant neoplasm of prostate: Secondary | ICD-10-CM | POA: Insufficient documentation

## 2023-01-30 DIAGNOSIS — D630 Anemia in neoplastic disease: Secondary | ICD-10-CM | POA: Diagnosis not present

## 2023-01-30 DIAGNOSIS — C7951 Secondary malignant neoplasm of bone: Secondary | ICD-10-CM | POA: Insufficient documentation

## 2023-01-30 DIAGNOSIS — F17210 Nicotine dependence, cigarettes, uncomplicated: Secondary | ICD-10-CM | POA: Diagnosis not present

## 2023-01-30 DIAGNOSIS — D539 Nutritional anemia, unspecified: Secondary | ICD-10-CM | POA: Insufficient documentation

## 2023-01-30 DIAGNOSIS — D649 Anemia, unspecified: Secondary | ICD-10-CM

## 2023-01-30 DIAGNOSIS — R0600 Dyspnea, unspecified: Secondary | ICD-10-CM | POA: Insufficient documentation

## 2023-01-30 DIAGNOSIS — G629 Polyneuropathy, unspecified: Secondary | ICD-10-CM | POA: Diagnosis not present

## 2023-01-30 DIAGNOSIS — D6959 Other secondary thrombocytopenia: Secondary | ICD-10-CM | POA: Diagnosis not present

## 2023-01-30 LAB — CMP (CANCER CENTER ONLY)
ALT: 9 U/L (ref 0–44)
AST: 16 U/L (ref 15–41)
Albumin: 3.3 g/dL — ABNORMAL LOW (ref 3.5–5.0)
Alkaline Phosphatase: 1489 U/L — ABNORMAL HIGH (ref 38–126)
Anion gap: 6 (ref 5–15)
BUN: 11 mg/dL (ref 8–23)
CO2: 21 mmol/L — ABNORMAL LOW (ref 22–32)
Calcium: 8.5 mg/dL — ABNORMAL LOW (ref 8.9–10.3)
Chloride: 111 mmol/L (ref 98–111)
Creatinine: 0.86 mg/dL (ref 0.61–1.24)
GFR, Estimated: >60 mL/min (ref >60)
Glucose, Bld: 90 mg/dL (ref 70–99)
Potassium: 4.3 mmol/L (ref 3.5–5.1)
Sodium: 138 mmol/L (ref 135–145)
Total Bilirubin: 0.5 mg/dL (ref 0.3–1.2)
Total Protein: 6.6 g/dL (ref 6.5–8.1)

## 2023-01-30 LAB — CBC WITH DIFFERENTIAL (CANCER CENTER ONLY)
Abs Immature Granulocytes: 0.05 10*3/uL (ref 0.00–0.07)
Basophils Absolute: 0 10*3/uL (ref 0.0–0.1)
Basophils Relative: 0 %
Eosinophils Absolute: 0 10*3/uL (ref 0.0–0.5)
Eosinophils Relative: 0 %
HCT: 28 % — ABNORMAL LOW (ref 39.0–52.0)
Hemoglobin: 8.8 g/dL — ABNORMAL LOW (ref 13.0–17.0)
Immature Granulocytes: 1 %
Lymphocytes Relative: 23 %
Lymphs Abs: 1.4 10*3/uL (ref 0.7–4.0)
MCH: 33.8 pg (ref 26.0–34.0)
MCHC: 31.4 g/dL (ref 30.0–36.0)
MCV: 107.7 fL — ABNORMAL HIGH (ref 80.0–100.0)
Monocytes Absolute: 0.5 10*3/uL (ref 0.1–1.0)
Monocytes Relative: 8 %
Neutro Abs: 3.9 10*3/uL (ref 1.7–7.7)
Neutrophils Relative %: 68 %
Platelet Count: 158 10*3/uL (ref 150–400)
RBC: 2.6 MIL/uL — ABNORMAL LOW (ref 4.22–5.81)
RDW: 20 % — ABNORMAL HIGH (ref 11.5–15.5)
WBC Count: 5.8 10*3/uL (ref 4.0–10.5)
nRBC: 0 % (ref 0.0–0.2)

## 2023-01-30 LAB — SAMPLE TO BLOOD BANK

## 2023-01-30 LAB — PSA: Prostatic Specific Antigen: 748.63 ng/mL — ABNORMAL HIGH (ref 0.00–4.00)

## 2023-01-30 NOTE — Telephone Encounter (Signed)
FMLA form for [employer] received and completed and sent for physician signature

## 2023-01-30 NOTE — Assessment & Plan Note (Addendum)
#  Stage IV-castrate resistant prostate cancer [OCT 2023] with multiple bone metastases/extensive lymphadenopathy; Currently s/p cycle # 6 of Taxotere [60 mg per metered square]- finished JULY, 2023. NOV 2023-PET scan shows multiple progressive bone lesions; shows no focal activity in the prostate/abdominal lymph nodes.  PSA rising-concerning for progressive disease.  # Currently on  ADT-Eligard + Pluvicto q 6 W- with Dr.Stewart.  Currently s/p #2 proceed with #3 next April 18th-patient tolerating fairly well.  Mild response in PSA noted.  # Weight loss/ dyspnea/fatigue-likely secondary to progression of disease. Continue Zyprexa at nighttime.  Currently off prednisone.  Stable.  Monitor for now  # PN- from taxotere- G-2-3; Right UE weakness- S/p evaluation to Dr. Cannon Kettle post MRI of the cervical spine. stable  # Hematology: Anemia/thrombocytopenia-  secondary to underlying prostate cancer/chronic disease.  FEB 2024- No Iron def;  Prn PRBC transfusion.  Hemoglobin  8.3 today- HOLD blood. Stable.   # Prostatism symptoms: Continue Flomax;Stable.   # Diffuse bone metastases/castrate resistant prostate cancer -currently on Zometa every 2 months or so.  Continue  ca+Vit D BID. Corrected calcium is 9.3. Stable.   # IV access: functional refilled-port flush every 2 to 3 months.  emla cream.   # Vaccination: s/p flu shot; reocmmend COVID; RSV   # eligard q 33M-last jan 17th, 2024 ; [prefers 1-2 pm]; zometa on 11/09/2022  # DISPOSITION:   # cancel Blood tomorrow;   # HOLD zometa  # follow up in 1 st week of MAY MD; labs-HOLD tube/port- cbc/cmp; PSA; possible zometa; D-2 possible 1 unit PRBC-  Dr.B

## 2023-01-30 NOTE — Progress Notes (Signed)
[facility] CONSULT NOTE  Patient Care Team: Alba Cory, MD as PCP - General (Family Medicine) Earna Coder, MD as Consulting Physician (Oncology) Riki Altes, MD (Urology)  CHIEF COMPLAINTS/PURPOSE OF CONSULTATION: prostate cancer  Oncology History Overview Note  compatible with widespread metastatic prostate cancer, including innumerable sclerotic osseous lesions and extensive lymphadenopathy in the pelvis and retroperitoneum, as detailed above. 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 2. There is also an indeterminate hypovascular lesion in segment 5 of the liver which may represent a metastatic lesion. This could be better characterized with follow-up abdominal MRI with and without IV gadolinium if clinically appropriate. 3. Aortic atherosclerosis with mild fusiform aneurysmal dilatation of the infrarenal abdominal aorta measuring up to 3.2 x 3.0 cm. Recommend follow-up ultrasound every 3 years. This recommendation follows ACR consensus guidelines: White Paper of the ACR Incidental Findings Committee II on Vascular Findings. J Am Coll Radiol 2013; 10:789-794. 4. Bladder wall appears mildly thickened, likely related to bladder outlet obstruction given the enlarged prostate gland. 5. Severe colonic diverticulosis without evidence of acute diverticulitis at this time.  IMPRESSION: There are numerous foci of abnormal tracer uptake in the axial and proximal appendicular skeleton consistent with extensive skeletal metastatic disease.     #Stage IV castrate sensitive prostate cancer-December/20 2023-MRI negative for any liver lesions.; FEB 24th, 2022-Firmagon loading dose;  # march 24th-Taxoeter with udenyca; 4/12- add darolutamide  # DEC 2022- 374 [Stoioff; Urology]   Prostate cancer  12/13/2021 Initial Diagnosis   Prostate cancer  (HCC)   12/13/2021 Cancer Staging   Staging form: Prostate, AJCC 8th Edition - Clinical: Stage IVB (cT2c, cN1, cM1, PSA: 374, Grade Group: 5) - Signed by Earna Coder, MD on 12/13/2021 Prostate specific antigen (PSA) range: 20 or greater Histologic grading system: 5 grade system   01/12/2022 - 04/28/2022 Chemotherapy   Patient is on Treatment Plan : PROSTATE Docetaxel + Prednisone q21d      Genetic Testing   Negative genetic testing. No pathogenic variants identified on the [facility] Common Hereditary Cancers +RNA panel. VUS in BRIP1 called c.485G>T identified. The report date is 03/04/2022.  The Common Hereditary Cancers Panel + RNA offered by [facility] includes sequencing and/or deletion duplication testing of the following 47 genes: APC, ATM, AXIN2, BARD1, BMPR1A, BRCA1, BRCA2, BRIP1, CDH1, CDKN2A (p14ARF), CDKN2A (p16INK4a), CKD4, CHEK2, CTNNA1, DICER1, EPCAM (Deletion/duplication testing only), GREM1 (promoter region deletion/duplication testing only), KIT, MEN1, MLH1, MSH2, MSH3, MSH6, MUTYH, NBN, NF1, NHTL1, PALB2, PDGFRA, PMS2, POLD1, POLE, PTEN, RAD50, RAD51C, RAD51D, SDHB, SDHC, SDHD, SMAD4, SMARCA4. STK11, TP53, TSC1, TSC2, and VHL.  The following genes were evaluated for sequence changes only: SDHA and HOXB13 c.251G>A variant only.    HISTORY OF PRESENTING ILLNESS: Patient ambulating with a  cane.   Accompanied by grand-daughter.   Eric [name] 82 y.o.  male pleasant patient above history of castrate resistant metastatic prostate cancer currently currently Eligard plus pluvicto is here for follow-up.    In the interim patient received cycle #2 on March 7.  Patient currently awaiting cycle #3 on April 18.   Denies any concerns today.  Appetite is improving.  Patient continues on Zyprexa 5 mg.  No nausea no vomiting.  No sores in the mouth.  Denies any blood in stools or black or stools.  Review of Systems  Constitutional:  Positive for malaise/fatigue and weight loss.  Negative for chills, diaphoresis  and fever.  HENT:  Negative for nosebleeds and sore throat.   Eyes:  Negative for double vision.  Respiratory:  Positive for shortness of breath. Negative for cough, hemoptysis, sputum production and wheezing.   Cardiovascular:  Negative for chest pain, palpitations, orthopnea and leg swelling.  Gastrointestinal:  Negative for abdominal pain, blood in stool, constipation, diarrhea, heartburn, melena, nausea and vomiting.  Genitourinary:  Negative for dysuria, frequency and urgency.  Musculoskeletal:  Positive for back pain and joint pain.  Skin: Negative.  Negative for itching and rash.  Neurological:  Negative for dizziness, tingling, focal weakness, weakness and headaches.  Endo/Heme/Allergies:  Does not bruise/bleed easily.  Psychiatric/Behavioral:  Negative for depression. The patient is not nervous/anxious and does not have insomnia.     MEDICAL HISTORY:  Past Medical History:  Diagnosis Date   Cancer    COPD (chronic obstructive pulmonary disease)    Dyspnea    with exertion   Family history of cancer    GERD (gastroesophageal reflux disease)    occ   Hyperlipidemia    Hypertension     SURGICAL HISTORY: Past Surgical History:  Procedure Laterality Date   COLONOSCOPY     HEMORROIDECTOMY     IR IMAGING GUIDED PORT INSERTION  12/20/2021   PROSTATE BIOPSY N/A 10/26/2021   Procedure: PROSTATE BIOPSY;  Surgeon: Riki AltesStoioff, Scott C, MD;  Location: [facility] ORS;  Service: Urology;  Laterality: N/A;   TRANSRECTAL ULTRASOUND N/A 10/26/2021   Procedure: TRANSRECTAL ULTRASOUND;  Surgeon: Riki AltesStoioff, Scott C, MD;  Location: [facility] ORS;  Service: Urology;  Laterality: N/A;    SOCIAL HISTORY: Social History   Socioeconomic History   Marital status: Single    Spouse name: Not on file   Number of children: 1   Years of education: Not on file   Highest education level: High school graduate  Occupational History   Occupation: retired   Tobacco Use   Smoking  status: Every Day    Packs/day: 0.50    Years: 60.00    Additional pack years: 0.00    Total pack years: 30.00    Types: Cigarettes   Smokeless tobacco: Never   Tobacco comments:    smoking cessation information provided  Vaping Use   Vaping Use: Never used  Substance and Sexual Activity   Alcohol use: Not Currently   Drug use: Not Currently   Sexual activity: Not Currently  Other Topics Concern   Not on file  Social History Narrative   Single, lives with male friend Rock Nephew[pt is care giver] for the past 20 plus years. 1/2 ppd smoke; no alcohol. 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 Daughter lives 10-15 mins; in [place].    Social Determinants of Health   Financial Resource Strain: Low Risk  (02/10/2022)   Overall Financial Resource Strain (CARDIA)    Difficulty of Paying Living Expenses: Not hard at all  Food Insecurity: No Food Insecurity (02/10/2022)   Hunger Vital Sign    Worried About Running Out of Food in the Last Year: Never true    Ran Out of Food in the Last Year: Never true  Transportation Needs: No Transportation Needs (02/10/2022)   PRAPARE - Administrator, Civil ServiceTransportation    Lack of Transportation (Medical): No    Lack of Transportation (Non-Medical): No  Physical Activity: Inactive (02/10/2022)   Exercise Vital Sign    Days of Exercise per Week: 0 days    Minutes of Exercise per Session: 0 min  Stress: No Stress Concern  Present (02/10/2022)   [employer] of Occupational Health - Occupational Stress Questionnaire    Feeling of Stress : Only a little  Social Connections: Socially Isolated (02/10/2022)   Social Connection and Isolation Panel [NHANES]    Frequency of Communication with Friends and Family: More than three times a week    Frequency of Social Gatherings with Friends and Family: Three times a week    Attends Religious Services: Never    Active Member of Clubs or Organizations: No    Attends Banker Meetings: Never    Marital Status:  Widowed  Intimate Partner Violence: Not At Risk (02/10/2022)   Humiliation, Afraid, Rape, and Kick questionnaire    Fear of Current or Ex-Partner: No    Emotionally Abused: No    Physically Abused: No    Sexually Abused: No    FAMILY HISTORY: Family History  Problem Relation Age of Onset   Heart disease Mother    COPD Father    Cancer Brother 34       blood cancer; unk exact type   Cancer Maternal Aunt        unk type   Cancer Paternal Aunt        unk type   Cancer Paternal Uncle        one unk type; one had throat   Cancer Cousin        unk types    ALLERGIES:  has No Known Allergies.  MEDICATIONS:  Current Outpatient Medications  Medication Sig Dispense Refill   acetaminophen (TYLENOL) 500 MG tablet Take 1 tablet (500 mg total) by mouth every 6 (six) hours as needed. 30 tablet 0   albuterol (VENTOLIN HFA) 108 (90 Base) MCG/ACT inhaler TAKE 2 PUFFS BY MOUTH EVERY 6 HOURS AS NEEDED FOR WHEEZE OR SHORTNESS OF BREATH 18 each 5   atorvastatin (LIPITOR) 40 MG tablet TAKE 1 TABLET (40 MG TOTAL) BY MOUTH DAILY. IN PLACE OF ROSUVASTATIN DUE TO CANER TREATMENT 90 tablet 1   Ca Carbonate-Mag Hydroxide (ROLAIDS PO) Take 1 tablet by mouth as needed.     ezetimibe (ZETIA) 10 MG tablet Take 1 tablet (10 mg total) by mouth every morning. 90 tablet 1   Fluticasone-Umeclidin-Vilant (TRELEGY ELLIPTA) 100-62.5-25 MCG/ACT AEPB Inhale 1 puff into the lungs daily. 3 each 1   lidocaine-prilocaine (EMLA) cream Apply on the port. 30 -45 min  prior to port access. 30 g 3   lisinopril (ZESTRIL) 2.5 MG tablet Take by mouth.     metoprolol succinate (TOPROL-XL) 25 MG 24 hr tablet Take 1 tablet (25 mg total) by mouth daily. Take with or immediately following a meal. 90 tablet 1   OLANZapine (ZYPREXA) 5 MG tablet TAKE 1 TABLET BY MOUTH EVERYDAY AT BEDTIME 90 tablet 2   ondansetron (ZOFRAN) 8 MG tablet One pill every 8 hours as needed for nausea/vomitting. 40 tablet 1   predniSONE (DELTASONE) 20 MG tablet  Take 2 tablets (40 mg total) by mouth daily with breakfast. 60 tablet 0   prochlorperazine (COMPAZINE) 10 MG tablet Take 1 tablet (10 mg total) by mouth every 6 (six) hours as needed for nausea or vomiting. 40 tablet 1   tamsulosin (FLOMAX) 0.4 MG CAPS capsule TAKE 1 CAPSULE BY MOUTH EVERY DAY AFTER SUPPER 90 capsule 1   gabapentin (NEURONTIN) 100 MG capsule Take 1 pill at nighttime for 1 week.  If tolerating well take 2 pills at night. (Patient not taking: Reported on 08/10/2022) 60 capsule 0  No current facility-administered medications for this visit.    PHYSICAL EXAMINATION:   Vitals:   01/30/23 1507  BP: (!) 119/50  Pulse: 96  Resp: 18  SpO2: 100%   Filed Weights   01/30/23 1503  Weight: 141 lb (64 kg)    Physical Exam Vitals and nursing note reviewed.  HENT:     Head: Normocephalic and atraumatic.     Mouth/Throat:     Pharynx: Oropharynx is clear.  Eyes:     Extraocular Movements: Extraocular movements intact.     Pupils: Pupils are equal, round, and reactive to light.  Cardiovascular:     Rate and Rhythm: Normal rate and regular rhythm.  Pulmonary:     Comments: Decreased breath sounds bilaterally.  Abdominal:     Palpations: Abdomen is soft.  Musculoskeletal:        General: Normal range of motion.     Cervical back: Normal range of motion.  Skin:    General: Skin is warm.  Neurological:     General: No focal deficit present.     Mental Status: He is alert and oriented to person, place, and time.  Psychiatric:        Behavior: Behavior normal.        Judgment: Judgment normal.     LABORATORY DATA:  I have reviewed the data as listed Lab Results  Component Value Date   WBC 5.8 01/30/2023   HGB 8.8 (L) 01/30/2023   HCT 28.0 (L) 01/30/2023   MCV 107.7 (H) 01/30/2023   PLT 158 01/30/2023   Recent Labs    12/21/22 0945 01/09/23 0937 01/30/23 1448  NA 140 138 138  K 4.6 3.8 4.3  CL 108 111 111  CO2 22 21* 21*  GLUCOSE 102* 123* 90  BUN 23 12  11   CREATININE 0.87 0.80 0.86  CALCIUM 8.3* 8.3* 8.5*  GFRNONAA >60 >60 >60  PROT 6.3* 6.2* 6.6  ALBUMIN 3.2* 3.2* 3.3*  AST 19 15 16   ALT 13 10 9   ALKPHOS 1,134* 800* 1,489*  BILITOT 0.4 0.4 0.5    RADIOGRAPHIC STUDIES: I have personally reviewed the radiological images as listed and agreed with the findings in the report. No results found.   Prostate cancer (HCC) #Stage IV-castrate resistant prostate cancer [OCT 2023] with multiple bone metastases/extensive lymphadenopathy; Currently s/p cycle # 6 of Taxotere [60 mg per metered square]- finished JULY, 2023. NOV 2023-PET scan shows multiple progressive bone lesions; shows no focal activity in the prostate/abdominal lymph nodes.  PSA rising-concerning for progressive disease.  # Currently on  ADT-Eligard + Pluvicto q 6 W- with Dr.Stewart.  Currently s/p #2 proceed with #3 next April 18th-patient tolerating fairly well.  Mild response in PSA noted.  # Weight loss/ dyspnea/fatigue-likely secondary to progression of disease. Continue Zyprexa at nighttime.  Currently off prednisone.  Stable.  Monitor for now  # PN- from taxotere- G-2-3; Right UE weakness- S/p evaluation to Dr. Cannon Kettle post MRI of the cervical spine. stable  # Hematology: Anemia/thrombocytopenia-  secondary to underlying prostate cancer/chronic disease.  FEB 2024- No Iron def;  Prn PRBC transfusion.  Hemoglobin  8.3 today- HOLD blood. Stable.   # Prostatism symptoms: Continue Flomax;Stable.   # Diffuse bone metastases/castrate resistant prostate cancer -currently on Zometa every 2 months or so.  Continue  ca+Vit D BID. Corrected calcium is 9.3. Stable.   # IV access: functional refilled-port flush every 2 to 3 months.  emla cream.   # Vaccination: s/p flu  shot; reocmmend COVID; RSV   # eligard q 27M-last jan 17th, 2024 ; [prefers 1-2 pm]; zometa on 11/09/2022  # DISPOSITION:   # cancel Blood tomorrow;   # HOLD zometa  # follow up in 1 st week of MAY MD;  labs-HOLD tube/port- cbc/cmp; PSA; possible zometa; D-2 possible 1 unit PRBC-  Dr.B      Earna Coder, MD 01/30/2023 3:42 PM

## 2023-01-30 NOTE — Telephone Encounter (Signed)
FMLA signed and faxed t [employer].

## 2023-01-30 NOTE — Progress Notes (Signed)
Patient has no concerns today. 

## 2023-01-31 ENCOUNTER — Inpatient Hospital Stay: Payer: Medicare HMO

## 2023-01-31 ENCOUNTER — Telehealth: Payer: Self-pay | Admitting: Family Medicine

## 2023-01-31 NOTE — Telephone Encounter (Signed)
Contacted Eric [name] to schedule their annual wellness visit. Appointment made for 02/16/2023.  [facility] Care Guide [facility] AWV TEAM Direct Dial: [phone]

## 2023-02-07 NOTE — Written Directive (Addendum)
  PLUVICTO  THERAPY   RADIOPHARMACEUTICAL: Lutetium 177 vipivotide tetraxetan (Pluvicto)     PRESCRIBED DOSE FOR ADMINISTRATION:  160 mCi   ROUTE OFADMINISTRATION:  IV   DIAGNOSIS: Prostate Cancer    REFERRING PHYSICIAN: Dr [name]    TREATMENT #: 3    ADDITIONAL PHYSICIAN COMMENTS/NOTES: Dose reduction 20% -- myelosuppression improved  AUTHORIZED USER SIGNATURE & TIME STAMP: Patriciaann Clan, MD   02/07/23    10:02 AM

## 2023-02-09 ENCOUNTER — Ambulatory Visit (HOSPITAL_COMMUNITY)
Admission: RE | Admit: 2023-02-09 | Discharge: 2023-02-09 | Disposition: A | Discharge status: Home / Self Care | Payer: Medicare HMO | Source: Ambulatory Visit | Attending: Internal Medicine | Admitting: Internal Medicine

## 2023-02-09 DIAGNOSIS — C61 Malignant neoplasm of prostate: Secondary | ICD-10-CM

## 2023-02-09 LAB — CBC WITH DIFFERENTIAL/PLATELET
Abs Immature Granulocytes: 0.04 10*3/uL (ref 0.00–0.07)
Basophils Absolute: 0 10*3/uL (ref 0.0–0.1)
Basophils Relative: 0 %
Eosinophils Absolute: 0 10*3/uL (ref 0.0–0.5)
Eosinophils Relative: 0 %
HCT: 28.2 % — ABNORMAL LOW (ref 39.0–52.0)
Hemoglobin: 8.7 g/dL — ABNORMAL LOW (ref 13.0–17.0)
Immature Granulocytes: 1 %
Lymphocytes Relative: 29 %
Lymphs Abs: 1.7 10*3/uL (ref 0.7–4.0)
MCH: 33.6 pg (ref 26.0–34.0)
MCHC: 30.9 g/dL (ref 30.0–36.0)
MCV: 108.9 fL — ABNORMAL HIGH (ref 80.0–100.0)
Monocytes Absolute: 0.5 10*3/uL (ref 0.1–1.0)
Monocytes Relative: 8 %
Neutro Abs: 3.7 10*3/uL (ref 1.7–7.7)
Neutrophils Relative %: 62 %
Platelets: 204 10*3/uL (ref 150–400)
RBC: 2.59 MIL/uL — ABNORMAL LOW (ref 4.22–5.81)
RDW: 19.4 % — ABNORMAL HIGH (ref 11.5–15.5)
WBC: 6 10*3/uL (ref 4.0–10.5)
nRBC: 0.3 % — ABNORMAL HIGH (ref 0.0–0.2)

## 2023-02-09 LAB — COMPREHENSIVE METABOLIC PANEL
ALT: 9 U/L (ref 0–44)
AST: 19 U/L (ref 15–41)
Albumin: 3.5 g/dL (ref 3.5–5.0)
Alkaline Phosphatase: 1115 U/L — ABNORMAL HIGH (ref 38–126)
Anion gap: 8 (ref 5–15)
BUN: 15 mg/dL (ref 8–23)
CO2: 20 mmol/L — ABNORMAL LOW (ref 22–32)
Calcium: 8.6 mg/dL — ABNORMAL LOW (ref 8.9–10.3)
Chloride: 111 mmol/L (ref 98–111)
Creatinine, Ser: 0.83 mg/dL (ref 0.61–1.24)
GFR, Estimated: >60 mL/min (ref >60)
Glucose, Bld: 121 mg/dL — ABNORMAL HIGH (ref 70–99)
Potassium: 4 mmol/L (ref 3.5–5.1)
Sodium: 139 mmol/L (ref 135–145)
Total Bilirubin: 0.5 mg/dL (ref 0.3–1.2)
Total Protein: 6.4 g/dL — ABNORMAL LOW (ref 6.5–8.1)

## 2023-02-09 MED ORDER — SODIUM CHLORIDE 0.9 % IV BOLUS: 1000.0000 mL | Freq: Once | INTRAVENOUS | Status: DC

## 2023-02-09 MED ORDER — LUTETIUM LU 177 VIPIVOTIDE TET 1000 MBQ/ML IV SOLN
166.2900 | Freq: Once | INTRAVENOUS | Status: AC
  Administered 2023-02-09: 166.29 via INTRAVENOUS

## 2023-02-09 MED ORDER — SODIUM CHLORIDE 0.9 % IV SOLN: INTRAVENOUS | Status: AC

## 2023-02-09 NOTE — Progress Notes (Signed)
When patient arrived  he informed me that he had a bloody bowel movement this am.  He thinks it is from hemmoroids.  Asked pt if there was blood in the bowl, which he answered yes and asked if there was blood when he wiiped as well and he said yes.  Informed Dr. Amil Amen. Orders for labs received.  Dr Amil Amen stated he would notify patients MD

## 2023-02-09 NOTE — Progress Notes (Signed)
CLINICAL DATA: [ 82 year old male with metastatic prostate carcinoma. Extensive PSMA positive metastatic skeletal disease.]  EXAM: NUCLEAR MEDICINE PLUVICTO INJECTION  TECHNIQUE: Infusion: The nuclear medicine technologist and I personally verified the dose activity to be delivered as specified in the written directive, and verified the patient identification via 2 separate methods.  Initial flush of the intravenous catheter was performed was sterile saline. The dose syringe was connected to the catheter and the Lu-177 Pluvicto administered over a 1 to 10 min infusion. Single 10 cc  lushes with normal saline follow the dose. No complications were noted. The entire IV tubing, venocatheter, stopcock and syringes was removed in total, placed in a disposal bag and sent for assay of the residual activity, which will be reported at a later time in our EMR by the physics staff. Pressure was applied to the venipuncture site, and a compression bandage placed. Patient monitored for 1 hour following infusion.    Radiation Safety personnel were present to perform the discharge survey, as detailed on their documentation. After a short period of observation, the patient had his IV removed.  RADIOPHARMACEUTICALS: [166.3] microcuries Lu-177 PLUVICTO  FINDINGS: Current Infusion: [3]  Planned Infusions: 6    Patient presented to nuclear medicine for treatment. The patient's most recent blood counts were reviewed and remains a good candidate to proceed with Lu-177 Pluvicto.   Patient is most recent blood counts were reviewed.  Patient remains mildly anemic but no decrease in hemoglobin.    Platelet count has improved to 200 K   PSA has decreased to 748 from greater than 1,500 prior therapy.  Patient reports some bloody stool which may relate to hemorrhoids.  With no change in hemoglobin and platelets decision to proceed with therapy.  Patient advised to report to ED if persistent or increased blood per  rectum.  Decision to proceed with continued reduced 20% dose reduction as there has been a positive PSA response and no adverse effect at this level.  Initial dose reduction due to anemia.   The paent was situated in an infusion suite with a contact barrier placed under the arm. Intravenous access was established, using sterile technique, and a normal saline infusion from a syringe was started.   Micro-dosimetry: The prescribed radiation activity was assayed and confirmed to be within specified tolerance.  IMPRESSION: Current Infusion: [3]  Planned Infusions: 6    [The patient tolerated the infusion well. The patient will return in 6 weeks for ongoing care.]

## 2023-02-14 ENCOUNTER — Other Ambulatory Visit: Payer: Self-pay

## 2023-02-14 DIAGNOSIS — C61 Malignant neoplasm of prostate: Secondary | ICD-10-CM

## 2023-02-15 ENCOUNTER — Encounter: Payer: Self-pay | Admitting: Medical Oncology

## 2023-02-15 ENCOUNTER — Inpatient Hospital Stay: Payer: Medicare HMO

## 2023-02-15 ENCOUNTER — Inpatient Hospital Stay (HOSPITAL_BASED_OUTPATIENT_CLINIC_OR_DEPARTMENT_OTHER): Payer: Medicare HMO | Admitting: Medical Oncology

## 2023-02-15 VITALS — BP 131/59 | HR 102 | Temp 97.6°F | Wt 141.6 lb

## 2023-02-15 DIAGNOSIS — C7951 Secondary malignant neoplasm of bone: Secondary | ICD-10-CM | POA: Diagnosis not present

## 2023-02-15 DIAGNOSIS — D649 Anemia, unspecified: Secondary | ICD-10-CM

## 2023-02-15 DIAGNOSIS — C61 Malignant neoplasm of prostate: Secondary | ICD-10-CM

## 2023-02-15 DIAGNOSIS — R634 Abnormal weight loss: Secondary | ICD-10-CM

## 2023-02-15 LAB — COMPREHENSIVE METABOLIC PANEL
ALT: 8 U/L (ref 0–44)
AST: 22 U/L (ref 15–41)
Albumin: 3.2 g/dL — ABNORMAL LOW (ref 3.5–5.0)
Alkaline Phosphatase: 989 U/L — ABNORMAL HIGH (ref 38–126)
Anion gap: 6 (ref 5–15)
BUN: 12 mg/dL (ref 8–23)
CO2: 21 mmol/L — ABNORMAL LOW (ref 22–32)
Calcium: 8.3 mg/dL — ABNORMAL LOW (ref 8.9–10.3)
Chloride: 110 mmol/L (ref 98–111)
Creatinine, Ser: 0.83 mg/dL (ref 0.61–1.24)
GFR, Estimated: >60 mL/min (ref >60)
Glucose, Bld: 128 mg/dL — ABNORMAL HIGH (ref 70–99)
Potassium: 3.9 mmol/L (ref 3.5–5.1)
Sodium: 137 mmol/L (ref 135–145)
Total Bilirubin: 0.3 mg/dL (ref 0.3–1.2)
Total Protein: 6.2 g/dL — ABNORMAL LOW (ref 6.5–8.1)

## 2023-02-15 LAB — CBC WITH DIFFERENTIAL/PLATELET
Abs Immature Granulocytes: 0.05 10*3/uL (ref 0.00–0.07)
Basophils Absolute: 0 10*3/uL (ref 0.0–0.1)
Basophils Relative: 0 %
Eosinophils Absolute: 0 10*3/uL (ref 0.0–0.5)
Eosinophils Relative: 0 %
HCT: 27.8 % — ABNORMAL LOW (ref 39.0–52.0)
Hemoglobin: 8.7 g/dL — ABNORMAL LOW (ref 13.0–17.0)
Immature Granulocytes: 1 %
Lymphocytes Relative: 14 %
Lymphs Abs: 0.6 10*3/uL — ABNORMAL LOW (ref 0.7–4.0)
MCH: 33.7 pg (ref 26.0–34.0)
MCHC: 31.3 g/dL (ref 30.0–36.0)
MCV: 107.8 fL — ABNORMAL HIGH (ref 80.0–100.0)
Monocytes Absolute: 0.3 10*3/uL (ref 0.1–1.0)
Monocytes Relative: 7 %
Neutro Abs: 3.5 10*3/uL (ref 1.7–7.7)
Neutrophils Relative %: 78 %
Platelets: 165 10*3/uL (ref 150–400)
RBC: 2.58 MIL/uL — ABNORMAL LOW (ref 4.22–5.81)
RDW: 18.7 % — ABNORMAL HIGH (ref 11.5–15.5)
WBC: 4.5 10*3/uL (ref 4.0–10.5)
nRBC: 0 % (ref 0.0–0.2)

## 2023-02-15 LAB — SAMPLE TO BLOOD BANK

## 2023-02-15 MED ORDER — HYDROCODONE-ACETAMINOPHEN 5-325 MG PO TABS: 1.0000 | ORAL_TABLET | Freq: Four times a day (QID) | ORAL | 0 refills | Status: AC | PRN

## 2023-02-15 NOTE — Progress Notes (Signed)
Hematology and Oncology Follow Up Visit  Eric [name] [id number] May 13, 1941 82 y.o. 02/15/2023  Past Medical History:  Diagnosis Date   Cancer    COPD (chronic obstructive pulmonary disease)    Dyspnea    with exertion   Family history of cancer    GERD (gastroesophageal reflux disease)    occ   Hyperlipidemia    Hypertension     Principle Diagnosis:  Stage IV castrate resistant prostate cancer Macrocytic anemia   Current Therapy:   Eliguard Q6 months - last January 17th, 2024- Next due July 2024 Pluvicto s/p cycle 3 (April 18th, 2024) Zometa Q1-2 months- last 11/09/2022- Overdue  IV Venofer PRN- Last  PRBC PRN- Last 12/23/2022  PriorTherapy:   Taxotere 6 cycles (completed July 2023)    Interim History:  Eric [name] is back for follow-up for anemia:  He is here with his grand daughter. They report that he has been doing pretty well all things considered.     Anemia: Patient has a history of symptomatic anemia secondary to chronic disease. He gets blood transfusions PRN. His first and most recent blood transfusion was on 12/23/2022 after a Hgb of 7.5. Since then it has remained above 8. He is here to see if he needs any additional blood at this time. He reports no bleeding or bruising episodes. No hemoptysis, epistaxis, hematuria, hematochezia, melena. Fatigue is stable.   Bone Pains: Patient reports that he has had chronic bone like pains of bilateral legs for over 1 year. Different than his neuropathy pain. Has known metastatic disease to bones. Currently taking OTC tylenol 1-2 times daily which helps some but does not fully resolve pain and discomfort. Pain and discomfort affecting his mobility at times. He denies ETOH or benzodiazepine use. He denies history of substance use.  Wt Readings from Last 3 Encounters:  02/15/23 141 lb 9.6 oz (64.2 kg)  01/30/23 141 lb (64 kg)  01/09/23 144 lb (65.3 kg)     Medications:   Current Outpatient Medications:     acetaminophen (TYLENOL) 500 MG tablet, Take 1 tablet (500 mg total) by mouth every 6 (six) hours as needed., Disp: 30 tablet, Rfl: 0   albuterol (VENTOLIN HFA) 108 (90 Base) MCG/ACT inhaler, TAKE 2 PUFFS BY MOUTH EVERY 6 HOURS AS NEEDED FOR WHEEZE OR SHORTNESS OF BREATH, Disp: 18 each, Rfl: 5   atorvastatin (LIPITOR) 40 MG tablet, TAKE 1 TABLET (40 MG TOTAL) BY MOUTH DAILY. IN PLACE OF ROSUVASTATIN DUE TO CANER TREATMENT, Disp: 90 tablet, Rfl: 1   Ca Carbonate-Mag Hydroxide (ROLAIDS PO), Take 1 tablet by mouth as needed., Disp: , Rfl:    ezetimibe (ZETIA) 10 MG tablet, Take 1 tablet (10 mg total) by mouth every morning., Disp: 90 tablet, Rfl: 1   Fluticasone-Umeclidin-Vilant (TRELEGY ELLIPTA) 100-62.5-25 MCG/ACT AEPB, Inhale 1 puff into the lungs daily., Disp: 3 each, Rfl: 1   HYDROcodone-acetaminophen (NORCO) 5-325 MG tablet, Take 1 tablet by mouth every 6 (six) hours as needed for up to 14 days for severe pain., Disp: 30 tablet, Rfl: 0   lidocaine-prilocaine (EMLA) cream, Apply on the port. 30 -45 min  prior to port access., Disp: 30 g, Rfl: 3   lisinopril (ZESTRIL) 2.5 MG tablet, Take by mouth., Disp: , Rfl:    metoprolol succinate (TOPROL-XL) 25 MG 24 hr tablet, Take 1 tablet (25 mg total) by mouth daily. Take with or immediately following a meal., Disp: 90 tablet, Rfl: 1   OLANZapine (ZYPREXA) 5 MG tablet, TAKE  1 TABLET BY MOUTH EVERYDAY AT BEDTIME, Disp: 90 tablet, Rfl: 2   ondansetron (ZOFRAN) 8 MG tablet, One pill every 8 hours as needed for nausea/vomitting., Disp: 40 tablet, Rfl: 1   predniSONE (DELTASONE) 20 MG tablet, Take 2 tablets (40 mg total) by mouth daily with breakfast., Disp: 60 tablet, Rfl: 0   prochlorperazine (COMPAZINE) 10 MG tablet, Take 1 tablet (10 mg total) by mouth every 6 (six) hours as needed for nausea or vomiting., Disp: 40 tablet, Rfl: 1   tamsulosin (FLOMAX) 0.4 MG CAPS capsule, TAKE 1 CAPSULE BY MOUTH EVERY DAY AFTER SUPPER, Disp: 90 capsule, Rfl: 1   gabapentin  (NEURONTIN) 100 MG capsule, Take 1 pill at nighttime for 1 week.  If tolerating well take 2 pills at night. (Patient not taking: Reported on 08/10/2022), Disp: 60 capsule, Rfl: 0  Allergies: No Known Allergies  Past Medical History, Surgical history, Social history, and Family History were reviewed and updated.  Review of Systems: Review of Systems - Oncology   Physical Exam:  weight is 141 lb 9.6 oz (64.2 kg). His tympanic temperature is 97.6 F (36.4 C). His blood pressure is 131/59 (abnormal) and his pulse is 102 (abnormal).   Physical Exam General: NAD Cardiovascular: regular rate and rhythm Pulmonary: clear ant fields Abdomen: soft, nontender, + bowel sounds GU: no suprapubic tenderness Extremities: no edema, no joint deformities Skin: no rashes Neurological: Weakness but otherwise nonfocal   Lab Results  Component Value Date   WBC 4.5 02/15/2023   HGB 8.7 (L) 02/15/2023   HCT 27.8 (L) 02/15/2023   MCV 107.8 (H) 02/15/2023   PLT 165 02/15/2023     Chemistry      Component Value Date/Time   NA 137 02/15/2023 1330   NA 142 08/17/2015 0928   K 3.9 02/15/2023 1330   CL 110 02/15/2023 1330   CO2 21 (L) 02/15/2023 1330   BUN 12 02/15/2023 1330   BUN 17 08/17/2015 0928   CREATININE 0.83 02/15/2023 1330   CREATININE 0.86 01/30/2023 1448   CREATININE 1.06 09/30/2021 1119      Component Value Date/Time   CALCIUM 8.3 (L) 02/15/2023 1330   ALKPHOS 989 (H) 02/15/2023 1330   AST 22 02/15/2023 1330   AST 16 01/30/2023 1448   ALT 8 02/15/2023 1330   ALT 9 01/30/2023 1448   BILITOT 0.3 02/15/2023 1330   BILITOT 0.5 01/30/2023 1448      Assessment and Plan- Patient is a 82 y.o. male    Encounter Diagnoses  Name Primary?   Prostate cancer Yes   Metastatic cancer to bone    Symptomatic anemia    Weight loss    Metastatic Prostate Cancer: Chronic in nature. Recently (01/30/2023) has his follow up visit with Dr. [name]- to assess his tolerance of his ADT-Eligard  + Pluvicto treatment. He is currently s/p cycle 3. He is tolerating this fairly well. We are watching his anemia while on this regimen which is why he is here today. No changes to his treatment plan needed today. His next follow up with Dr. [name] is on 02/22/2023.    Macrocytic anemia: Chronic in nature and stable. Thought to be secondary to chronic disease/possibly his Pluvicto. His first and most recent blood transfusion was over 1 month ago. So far his labs have been stable. This is reassuring. He denies any blood loss. In February he had a ferritin and iron panel obtained which showed an iron saturation of 39% and ferritin of 614. May  benefit from folic acid or potentially B12 given the macrocytic nature of his anemia and concurrent chronic low oral intake. I would suggest we check these labs at follow up to rule them out as factors involved.    Bone Pain: Chronic in nature. No specific areas of concern needing imaging today. Likely related to his metastatic disease. Vitamin D level on 11/09/2022 was 41.63. He is currently supposed to be getting Zometa every 1-2 months - he is currently overdue. Not currently planned for today- will need insurance approval and scheduling. I will place these orders and have him scheduled for Zometa within the week ideally. Current corrected calcium is 8.5. We discussed palliative care which he is interested in. For now we discussed a trial of Norco- declined tramadol due to risk of dizziness. We reviewed how to take this medication, common potential side effects, black box warnings and precautions. We discussed not taking this medication with OTC tylenol during the same time period as the Norco contains tylenol. Scheduling him a palliative care visit to discuss further.   Weight loss and malnutrition: Chronic and secondary to his underlying diagnosis and lower oral intake. He has seen nutrition and is currently on Zyprexa which he is tolerating well. Weight is now  stable. Plan to continue his Zyprexa.   Disposition: No blood needed tomorrow Sending in Norco trial   RTC within 1 week labs (CMP)+ Zometa - no provider  RTC 1 month Day 1 APP, labs (CBC w/, CMP, ferritin, iron, B12, folate, hold tube) D2 possible 1 unit of blood  RTC 1 month (same day as APP visit) palliative care visit with [name] to discuss his cancer related chronic pain-[place]    Clent Jacks PA-C 4/24/20243:23 PM

## 2023-02-16 ENCOUNTER — Inpatient Hospital Stay: Payer: Medicare HMO

## 2023-02-16 ENCOUNTER — Encounter: Payer: Self-pay | Admitting: Internal Medicine

## 2023-02-22 ENCOUNTER — Encounter: Payer: Self-pay | Admitting: Internal Medicine

## 2023-02-22 ENCOUNTER — Inpatient Hospital Stay: Payer: Medicare HMO

## 2023-02-22 ENCOUNTER — Inpatient Hospital Stay (HOSPITAL_BASED_OUTPATIENT_CLINIC_OR_DEPARTMENT_OTHER): Payer: Medicare HMO | Admitting: Internal Medicine

## 2023-02-22 ENCOUNTER — Inpatient Hospital Stay: Payer: Medicare HMO | Attending: Internal Medicine

## 2023-02-22 DIAGNOSIS — C61 Malignant neoplasm of prostate: Secondary | ICD-10-CM

## 2023-02-22 DIAGNOSIS — C7951 Secondary malignant neoplasm of bone: Secondary | ICD-10-CM | POA: Diagnosis not present

## 2023-02-22 DIAGNOSIS — D630 Anemia in neoplastic disease: Secondary | ICD-10-CM | POA: Diagnosis not present

## 2023-02-22 DIAGNOSIS — D649 Anemia, unspecified: Secondary | ICD-10-CM

## 2023-02-22 DIAGNOSIS — F17210 Nicotine dependence, cigarettes, uncomplicated: Secondary | ICD-10-CM | POA: Diagnosis not present

## 2023-02-22 DIAGNOSIS — Z809 Family history of malignant neoplasm, unspecified: Secondary | ICD-10-CM | POA: Diagnosis not present

## 2023-02-22 DIAGNOSIS — D6959 Other secondary thrombocytopenia: Secondary | ICD-10-CM | POA: Diagnosis not present

## 2023-02-22 LAB — CMP (CANCER CENTER ONLY)
ALT: 8 U/L (ref 0–44)
AST: 22 U/L (ref 15–41)
Albumin: 3.2 g/dL — ABNORMAL LOW (ref 3.5–5.0)
Alkaline Phosphatase: 970 U/L — ABNORMAL HIGH (ref 38–126)
Anion gap: 10 (ref 5–15)
BUN: 12 mg/dL (ref 8–23)
CO2: 18 mmol/L — ABNORMAL LOW (ref 22–32)
Calcium: 8.1 mg/dL — ABNORMAL LOW (ref 8.9–10.3)
Chloride: 106 mmol/L (ref 98–111)
Creatinine: 0.86 mg/dL (ref 0.61–1.24)
GFR, Estimated: >60 mL/min (ref >60)
Glucose, Bld: 122 mg/dL — ABNORMAL HIGH (ref 70–99)
Potassium: 4.1 mmol/L (ref 3.5–5.1)
Sodium: 134 mmol/L — ABNORMAL LOW (ref 135–145)
Total Bilirubin: 0.3 mg/dL (ref 0.3–1.2)
Total Protein: 6 g/dL — ABNORMAL LOW (ref 6.5–8.1)

## 2023-02-22 LAB — CBC WITH DIFFERENTIAL (CANCER CENTER ONLY)
Abs Immature Granulocytes: 0.07 10*3/uL (ref 0.00–0.07)
Basophils Absolute: 0 10*3/uL (ref 0.0–0.1)
Basophils Relative: 0 %
Eosinophils Absolute: 0 10*3/uL (ref 0.0–0.5)
Eosinophils Relative: 0 %
HCT: 26.9 % — ABNORMAL LOW (ref 39.0–52.0)
Hemoglobin: 8.3 g/dL — ABNORMAL LOW (ref 13.0–17.0)
Immature Granulocytes: 2 %
Lymphocytes Relative: 14 %
Lymphs Abs: 0.6 10*3/uL — ABNORMAL LOW (ref 0.7–4.0)
MCH: 33.3 pg (ref 26.0–34.0)
MCHC: 30.9 g/dL (ref 30.0–36.0)
MCV: 108 fL — ABNORMAL HIGH (ref 80.0–100.0)
Monocytes Absolute: 0.4 10*3/uL (ref 0.1–1.0)
Monocytes Relative: 9 %
Neutro Abs: 3.1 10*3/uL (ref 1.7–7.7)
Neutrophils Relative %: 75 %
Platelet Count: 177 10*3/uL (ref 150–400)
RBC: 2.49 MIL/uL — ABNORMAL LOW (ref 4.22–5.81)
RDW: 18.4 % — ABNORMAL HIGH (ref 11.5–15.5)
WBC Count: 4.2 10*3/uL (ref 4.0–10.5)
nRBC: 0 % (ref 0.0–0.2)

## 2023-02-22 LAB — SAMPLE TO BLOOD BANK

## 2023-02-22 LAB — PSA: Prostatic Specific Antigen: 1085.24 ng/mL — ABNORMAL HIGH (ref 0.00–4.00)

## 2023-02-22 NOTE — Assessment & Plan Note (Addendum)
#  Stage IV-castrate resistant prostate cancer [OCT 2023] with multiple bone metastases/extensive lymphadenopathy; Currently s/p cycle # 6 of Taxotere [60 mg per metered square]- finished JULY, 2023. NOV 2023-PET scan shows multiple progressive bone lesions; shows no focal activity in the prostate/abdominal lymph nodes.  PSA rising-concerning for progressive disease.  # Currently on  ADT-Eligard + Pluvicto q 6 W- with Dr.Stewart.  Currently s/p #3 proceed with #3- April 18th-patient tolerating fairly well.  Response in PSA noted- stable. #4 on 5/21.   # Weight loss/ dyspnea/fatigue-likely secondary to progression of disease. Continue Zyprexa at nighttime.  Currently off prednisone.  Stable.   # PN- from taxotere- G-2-3; Right UE weakness- S/p evaluation to Dr. Cannon Kettle post MRI of the cervical spine.Stable.   # Hematology: Anemia/thrombocytopenia-  secondary to underlying prostate cancer/chronic disease.  FEB 2024- No Iron def;  Prn PRBC transfusion.  Hemoglobin  8.3 today- HOLD blood. Stable.   # Prostatism symptoms: Continue Flomax;Stable.   # Diffuse bone metastases/castrate resistant prostate cancer -currently on Zometa every 2 months or so.  Continue  ca+Vit D BID. JAN 2024- vit D 42. Corrected calcium is 9.3. Stable.   # IV access: functional refilled-port flush every 2 to 3 months.  emla cream.   # Vaccination: s/p flu shot; reocmmend COVID; RSV   # eligard q 56M-last jan 17th, 2024 ; [prefers 1-2 pm]; zometa on 11/09/2022  # DISPOSITION:   # cancel Blood tomorrow;   # HOLD zometa  # follow up 1 months- labs-HOLD tube/port- cbc/cmp; PSA; possible zometa; D-2 possible 1 unit PRBC-  Dr.B

## 2023-02-22 NOTE — Progress Notes (Signed)
Eric [name] CONSULT NOTE  Patient Care Team: Alba Cory, MD as PCP - General (Family Medicine) Earna Coder, MD as Consulting Physician (Oncology) Riki Altes, MD (Urology)  CHIEF COMPLAINTS/PURPOSE OF CONSULTATION: prostate cancer  Oncology History Overview Note  compatible with widespread metastatic prostate cancer, including innumerable sclerotic osseous lesions and extensive lymphadenopathy in the pelvis and retroperitoneum, as detailed above. 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 2. There is also an indeterminate hypovascular lesion in segment 5 of the liver which may represent a metastatic lesion. This could be better characterized with follow-up abdominal MRI with and without IV gadolinium if clinically appropriate. 3. Aortic atherosclerosis with mild fusiform aneurysmal dilatation of the infrarenal abdominal aorta measuring up to 3.2 x 3.0 cm. Recommend follow-up ultrasound every 3 years. This recommendation follows ACR consensus guidelines: White Paper of the ACR Incidental Findings Committee II on Vascular Findings. J Am Coll Radiol 2013; 10:789-794. 4. Bladder wall appears mildly thickened, likely related to bladder outlet obstruction given the enlarged prostate gland. 5. Severe colonic diverticulosis without evidence of acute diverticulitis at this time.  IMPRESSION: There are numerous foci of abnormal tracer uptake in the axial and proximal appendicular skeleton consistent with extensive skeletal metastatic disease.     #Stage IV castrate sensitive prostate cancer-December/20 2023-MRI negative for any liver lesions.; FEB 24th, 2022-Firmagon loading dose;  # march 24th-Taxoeter with udenyca; 4/12- add darolutamide  # DEC 2022- 374 [[facility]; Urology]   Prostate cancer (HCC)  12/13/2021 Initial Diagnosis   Prostate  cancer (HCC)   12/13/2021 Cancer Staging   Staging form: Prostate, AJCC 8th Edition - Clinical: Stage IVB (cT2c, cN1, cM1, PSA: 374, Grade Group: 5) - Signed by Earna Coder, MD on 12/13/2021 Prostate specific antigen (PSA) range: 20 or greater Histologic grading system: 5 grade system   01/12/2022 - 04/28/2022 Chemotherapy   Patient is on Treatment Plan : PROSTATE Docetaxel + Prednisone q21d      Genetic Testing   Negative genetic testing. No pathogenic variants identified on the [facility] Common Hereditary Cancers +RNA panel. VUS in BRIP1 called c.485G>T identified. The report date is 03/04/2022.  The Common Hereditary Cancers Panel + RNA offered by [facility] includes sequencing and/or deletion duplication testing of the following 47 genes: APC, ATM, AXIN2, BARD1, BMPR1A, BRCA1, BRCA2, BRIP1, CDH1, CDKN2A (p14ARF), CDKN2A (p16INK4a), CKD4, CHEK2, CTNNA1, DICER1, EPCAM (Deletion/duplication testing only), GREM1 (promoter region deletion/duplication testing only), KIT, MEN1, MLH1, MSH2, MSH3, MSH6, MUTYH, NBN, NF1, NHTL1, PALB2, PDGFRA, PMS2, POLD1, POLE, PTEN, RAD50, RAD51C, RAD51D, SDHB, SDHC, SDHD, SMAD4, SMARCA4. STK11, TP53, TSC1, TSC2, and VHL.  The following genes were evaluated for sequence changes only: SDHA and HOXB13 c.251G>A variant only.    HISTORY OF PRESENTING ILLNESS: Patient ambulating with a  cane.   Accompanied by grand-daughter.   Eric [name] 82 y.o.  male pleasant patient above history of castrate resistant metastatic prostate cancer currently currently Eligard plus pluvicto is here for follow-up.    Patient currently s/p cycle #3 on April 18.    C/o being dizzy at time.  No falls.    Appetite is improving.  Patient continues on Zyprexa 5 mg.  No nausea no vomiting.  No sores in the mouth.  Denies any blood in stools or black or stools.  Review of Systems  Constitutional:  Positive for malaise/fatigue and weight loss. Negative for chills, diaphoresis and fever.  HENT:  Negative for nosebleeds and sore throat.   Eyes:  Negative for double vision.  Respiratory:  Positive for shortness of breath. Negative for cough, hemoptysis, sputum production and wheezing.   Cardiovascular:  Negative for chest pain, palpitations, orthopnea and leg swelling.  Gastrointestinal:  Negative for abdominal pain, blood in stool, constipation, diarrhea, heartburn, melena, nausea and vomiting.  Genitourinary:  Negative for dysuria, frequency and urgency.  Musculoskeletal:  Positive for back pain and joint pain.  Skin: Negative.  Negative for itching and rash.  Neurological:  Negative for dizziness, tingling, focal weakness, weakness and headaches.  Endo/Heme/Allergies:  Does not bruise/bleed easily.  Psychiatric/Behavioral:  Negative for depression. The patient is not nervous/anxious and does not have insomnia.     MEDICAL HISTORY:  Past Medical History:  Diagnosis Date   Cancer (HCC)    COPD (chronic obstructive pulmonary disease) (HCC)    Dyspnea    with exertion   Family history of cancer    GERD (gastroesophageal reflux disease)    occ   Hyperlipidemia    Hypertension     SURGICAL HISTORY: Past Surgical History:  Procedure Laterality Date   COLONOSCOPY     HEMORROIDECTOMY     IR IMAGING GUIDED PORT INSERTION  12/20/2021   PROSTATE BIOPSY N/A 10/26/2021   Procedure: PROSTATE BIOPSY;  Surgeon: Riki Altes, MD;  Location: [facility] OR[facility];  Service: Urology;  Laterality: N/A;   TRANSRECTAL ULTRASOUND N/A 10/26/2021   Procedure: TRANSRECTAL ULTRASOUND;  Surgeon: Riki Altes, MD;  Location: [facility] OR[facility];  Service: Urology;  Laterality: N/A;    SOCIAL HISTORY: Social History   Socioeconomic History   Marital status: Single    Spouse name: Not on file   Number of children: 1   Years of education: Not on file   Highest education level: High school graduate  Occupational History   Occupation: retired   Tobacco Use   Smoking status: Every Day    Packs/day:  0.50    Years: 60.00    Additional pack years: 0.00    Total pack years: 30.00    Types: Cigarettes   Smokeless tobacco: Never   Tobacco comments:    smoking cessation information provided  Vaping Use   Vaping Use: Never used  Substance and Sexual Activity   Alcohol use: Not Currently   Drug use: Not Currently   Sexual activity: Not Currently  Other Topics Concern   Not on file  Social History Narrative   Single, lives with male friend Rock Nephew is care giver] for the past 20 plus years. 1/2 ppd smoke; no alcohol. 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 Daughter lives 10-15 mins; in [place].    Social Determinants of Health   Financial Resource Strain: Low Risk  (02/10/2022)   Overall Financial Resource Strain (CARDIA)    Difficulty of Paying Living Expenses: Not hard at all  Food Insecurity: No Food Insecurity (02/10/2022)   Hunger Vital Sign    Worried About Running Out of Food in the Last Year: Never true    Ran Out of Food in the Last Year: Never true  Transportation Needs: No Transportation Needs (02/10/2022)   PRAPARE - Administrator, Civil Service (Medical): No    Lack of Transportation (Non-Medical): No  Physical Activity: Inactive (02/10/2022)   Exercise Vital Sign    Days of Exercise per Week: 0 days    Minutes of Exercise per Session: 0 min  Stress: No Stress Concern Present (  02/10/2022)   [employer] [employer] - Occupational Stress Questionnaire    Feeling of Stress : Only a little  Social Connections: Socially Isolated (02/10/2022)   Social Connection and Isolation Panel [NHANES]    Frequency of Communication with Friends and Family: More than three times a week    Frequency of Social Gatherings with Friends and Family: Three times a week    Attends Religious Services: Never    Active Member of Clubs or Organizations: No    Attends Banker Meetings: Never    Marital Status: Widowed  Intimate Partner  Violence: Not At Risk (02/10/2022)   Humiliation, Afraid, Rape, and Kick questionnaire    Fear of Current or Ex-Partner: No    Emotionally Abused: No    Physically Abused: No    Sexually Abused: No    FAMILY HISTORY: Family History  Problem Relation Age of Onset   Heart disease Mother    COPD Father    Cancer Brother 37       blood cancer; unk exact type   Cancer Maternal Aunt        unk type   Cancer Paternal Aunt        unk type   Cancer Paternal Uncle        one unk type; one had throat   Cancer Cousin        unk types    ALLERGIES:  has No Known Allergies.  MEDICATIONS:  Current Outpatient Medications  Medication Sig Dispense Refill   acetaminophen (TYLENOL) 500 MG tablet Take 1 tablet (500 mg total) by mouth every 6 (six) hours as needed. 30 tablet 0   albuterol (VENTOLIN HFA) 108 (90 Base) MCG/ACT inhaler TAKE 2 PUFFS BY MOUTH EVERY 6 HOURS AS NEEDED FOR WHEEZE OR SHORTNESS OF BREATH 18 each 5   atorvastatin (LIPITOR) 40 MG tablet TAKE 1 TABLET (40 MG TOTAL) BY MOUTH DAILY. IN PLACE OF ROSUVASTATIN DUE TO CANER TREATMENT 90 tablet 1   Ca Carbonate-Mag Hydroxide (ROLAIDS PO) Take 1 tablet by mouth as needed.     ezetimibe (ZETIA) 10 MG tablet Take 1 tablet (10 mg total) by mouth every morning. 90 tablet 1   Fluticasone-Umeclidin-Vilant (TRELEGY ELLIPTA) 100-62.5-25 MCG/ACT AEPB Inhale 1 puff into the lungs daily. 3 each 1   HYDROcodone-acetaminophen (NORCO) 5-325 MG tablet Take 1 tablet by mouth every 6 (six) hours as needed for up to 14 days for severe pain. 30 tablet 0   lidocaine-prilocaine (EMLA) cream Apply on the port. 30 -45 min  prior to port access. 30 g 3   lisinopril (ZESTRIL) 2.5 MG tablet Take by mouth.     metoprolol succinate (TOPROL-XL) 25 MG 24 hr tablet Take 1 tablet (25 mg total) by mouth daily. Take with or immediately following a meal. 90 tablet 1   OLANZapine (ZYPREXA) 5 MG tablet TAKE 1 TABLET BY MOUTH EVERYDAY AT BEDTIME 90 tablet 2   ondansetron  (ZOFRAN) 8 MG tablet One pill every 8 hours as needed for nausea/vomitting. 40 tablet 1   predniSONE (DELTASONE) 20 MG tablet Take 2 tablets (40 mg total) by mouth daily with breakfast. 60 tablet 0   prochlorperazine (COMPAZINE) 10 MG tablet Take 1 tablet (10 mg total) by mouth every 6 (six) hours as needed for nausea or vomiting. 40 tablet 1   tamsulosin (FLOMAX) 0.4 MG CAPS capsule TAKE 1 CAPSULE BY MOUTH EVERY DAY AFTER SUPPER 90 capsule 1   gabapentin (NEURONTIN) 100 MG capsule  Take 1 pill at nighttime for 1 week.  If tolerating well take 2 pills at night. (Patient not taking: Reported on 08/10/2022) 60 capsule 0   No current facility-administered medications for this visit.    PHYSICAL EXAMINATION:   Vitals:   02/22/23 1320  BP: (!) 120/52  Pulse: (!) 58  Temp: (!) 97.5 F (36.4 C)  SpO2: 100%   Filed Weights   02/22/23 1320  Weight: 139 lb 3.2 oz (63.1 kg)    Physical Exam Vitals and nursing note reviewed.  HENT:     Head: Normocephalic and atraumatic.     Mouth/Throat:     Pharynx: Oropharynx is clear.  Eyes:     Extraocular Movements: Extraocular movements intact.     Pupils: Pupils are equal, round, and reactive to light.  Cardiovascular:     Rate and Rhythm: Normal rate and regular rhythm.  Pulmonary:     Comments: Decreased breath sounds bilaterally.  Abdominal:     Palpations: Abdomen is soft.  Musculoskeletal:        General: Normal range of motion.     Cervical back: Normal range of motion.  Skin:    General: Skin is warm.  Neurological:     General: No focal deficit present.     Mental Status: He is alert and oriented to person, place, and time.  Psychiatric:        Behavior: Behavior normal.        Judgment: Judgment normal.     LABORATORY DATA:  I have reviewed the data as listed Lab Results  Component Value Date   WBC 4.2 02/22/2023   HGB 8.3 (L) 02/22/2023   HCT 26.9 (L) 02/22/2023   MCV 108.0 (H) 02/22/2023   PLT 177 02/22/2023    Recent Labs    02/09/23 1110 02/15/23 1330 02/22/23 1319  NA 139 137 134*  K 4.0 3.9 4.1  CL 111 110 106  CO2 20* 21* 18*  GLUCOSE 121* 128* 122*  BUN 15 12 12   CREATININE 0.83 0.83 0.86  CALCIUM 8.6* 8.3* 8.1*  GFRNONAA >60 >60 >60  PROT 6.4* 6.2* 6.0*  ALBUMIN 3.5 3.2* 3.2*  AST 19 22 22   ALT 9 8 8   ALKPHOS 1,115* 989* 970*  BILITOT 0.5 0.3 0.3    RADIOGRAPHIC STUDIES: I have personally reviewed the radiological images as listed and agreed with the findings in the report. NM PLUVICTO ADMINISTRATION  Result Date: 02/09/2023 CLINICAL DATA:  82 year old male with metastatic prostate carcinoma. Extensive PSMA positive metastatic skeletal disease. EXAM: NUCLEAR MEDICINE PLUVICTO INJECTION TECHNIQUE: Infusion: The nuclear medicine technologist and I personally verified the dose activity to be delivered as specified in the written directive, and verified the patient identification via 2 separate methods. Initial flush of the intravenous catheter was performed was sterile saline. The dose syringe was connected to the catheter and the Lu-177 Pluvicto administered over a 1 to 10 min infusion. Single 10 cc lushes with normal saline follow the dose. No complications were noted. The entire IV tubing, venocatheter, stopcock and syringes was removed in total, placed in a disposal bag and sent for assay of the residual activity, which will be reported at a later time in our EMR by the physics staff. Pressure was applied to the venipuncture site, and a compression bandage placed. Patient monitored for 1 hour following infusion. Radiation Safety personnel were present to perform the discharge survey, as detailed on their documentation. After a short period of observation, the patient had  his IV removed. RADIOPHARMACEUTICALS:  166.3 microcuries Lu-177 PLUVICTO FINDINGS: Current Infusion: 3 Planned Infusions: 6 Patient presented to nuclear medicine for treatment. The patient's most recent blood counts  were reviewed and remains a good candidate to proceed with Lu-177 Pluvicto. Patient is most recent blood counts were reviewed. Patient remains mildly anemic but no decrease in hemoglobin. Platelet count has improved to 200 K PSA has decreased to 748 from greater than 1,500 prior therapy. Patient reports some bloody stool which may relate to hemorrhoids. With no change in hemoglobin and platelets decision to proceed with therapy. Patient advised to report to ED if persistent or increased blood per rectum. Decision to proceed with continued reduced 20% dose reduction as there has been a positive PSA response and no adverse effect at this level. Initial dose reduction due to anemia. The patient was situated in an infusion suite with a contact barrier placed under the arm. Intravenous access was established, using sterile technique, and a normal saline infusion from a syringe was started. Micro-dosimetry: The prescribed radiation activity was assayed and confirmed to be within specified tolerance. IMPRESSION: Current Infusion: 3 Planned Infusions: 6 The patient tolerated the infusion well. The patient will return in 6 weeks for ongoing care. Electronically Signed   By: Genevive Bi M.D.   On: 02/09/2023 13:47     Prostate cancer (HCC) #Stage IV-castrate resistant prostate cancer [OCT 2023] with multiple bone metastases/extensive lymphadenopathy; Currently s/p cycle # 6 of Taxotere [60 mg per metered square]- finished JULY, 2023. NOV 2023-PET scan shows multiple progressive bone lesions; shows no focal activity in the prostate/abdominal lymph nodes.  PSA rising-concerning for progressive disease.  # Currently on  ADT-Eligard + Pluvicto q 6 W- with Dr.Stewart.  Currently s/p #3 proceed with #3- April 18th-patient tolerating fairly well.  Response in PSA noted- stable. #4 on 5/21.   # Weight loss/ dyspnea/fatigue-likely secondary to progression of disease. Continue Zyprexa at nighttime.  Currently off  prednisone.  Stable.   # PN- from taxotere- G-2-3; Right UE weakness- S/p evaluation to Dr. Cannon Kettle post MRI of the cervical spine.Stable.   # Hematology: Anemia/thrombocytopenia-  secondary to underlying prostate cancer/chronic disease.  FEB 2024- No Iron def;  Prn PRBC transfusion.  Hemoglobin  8.3 today- HOLD blood. Stable.   # Prostatism symptoms: Continue Flomax;Stable.   # Diffuse bone metastases/castrate resistant prostate cancer -currently on Zometa every 2 months or so.  Continue  ca+Vit D BID. JAN 2024- vit D 42. Corrected calcium is 9.3. Stable.   # IV access: functional refilled-port flush every 2 to 3 months.  emla cream.   # Vaccination: s/p flu shot; reocmmend COVID; RSV   # eligard q 80M-last jan 17th, 2024 ; [prefers 1-2 pm]; zometa on 11/09/2022  # DISPOSITION:   # cancel Blood tomorrow;   # HOLD zometa  # follow up 1 months- labs-HOLD tube/port- cbc/cmp; PSA; possible zometa; D-2 possible 1 unit PRBC-  Dr.B      Earna Coder, MD 02/22/2023 2:33 PM

## 2023-02-22 NOTE — Progress Notes (Signed)
C/o being dizzy at times.

## 2023-02-23 ENCOUNTER — Inpatient Hospital Stay: Payer: Medicare HMO

## 2023-03-10 ENCOUNTER — Other Ambulatory Visit: Payer: Self-pay | Admitting: *Deleted

## 2023-03-10 DIAGNOSIS — C61 Malignant neoplasm of prostate: Secondary | ICD-10-CM

## 2023-03-10 DIAGNOSIS — D649 Anemia, unspecified: Secondary | ICD-10-CM

## 2023-03-13 ENCOUNTER — Inpatient Hospital Stay: Payer: Medicare HMO

## 2023-03-13 ENCOUNTER — Inpatient Hospital Stay (HOSPITAL_BASED_OUTPATIENT_CLINIC_OR_DEPARTMENT_OTHER): Payer: Medicare HMO | Admitting: Nurse Practitioner

## 2023-03-13 ENCOUNTER — Inpatient Hospital Stay (HOSPITAL_BASED_OUTPATIENT_CLINIC_OR_DEPARTMENT_OTHER): Payer: Medicare HMO | Admitting: Hospice and Palliative Medicine

## 2023-03-13 ENCOUNTER — Ambulatory Visit: Payer: Medicare HMO | Admitting: Nurse Practitioner

## 2023-03-13 ENCOUNTER — Other Ambulatory Visit: Payer: Medicare HMO

## 2023-03-13 ENCOUNTER — Encounter: Payer: Self-pay | Admitting: Nurse Practitioner

## 2023-03-13 VITALS — BP 123/50 | HR 95 | Temp 96.9°F | Wt 134.3 lb

## 2023-03-13 DIAGNOSIS — D630 Anemia in neoplastic disease: Secondary | ICD-10-CM

## 2023-03-13 DIAGNOSIS — C7951 Secondary malignant neoplasm of bone: Secondary | ICD-10-CM | POA: Diagnosis not present

## 2023-03-13 DIAGNOSIS — D649 Anemia, unspecified: Secondary | ICD-10-CM

## 2023-03-13 DIAGNOSIS — F17210 Nicotine dependence, cigarettes, uncomplicated: Secondary | ICD-10-CM | POA: Diagnosis not present

## 2023-03-13 DIAGNOSIS — R5383 Other fatigue: Secondary | ICD-10-CM

## 2023-03-13 DIAGNOSIS — C61 Malignant neoplasm of prostate: Secondary | ICD-10-CM

## 2023-03-13 DIAGNOSIS — Z515 Encounter for palliative care: Secondary | ICD-10-CM

## 2023-03-13 DIAGNOSIS — R634 Abnormal weight loss: Secondary | ICD-10-CM | POA: Diagnosis not present

## 2023-03-13 DIAGNOSIS — D696 Thrombocytopenia, unspecified: Secondary | ICD-10-CM

## 2023-03-13 DIAGNOSIS — Z808 Family history of malignant neoplasm of other organs or systems: Secondary | ICD-10-CM

## 2023-03-13 LAB — TYPE AND SCREEN
ABO/RH(D): O POS
Antibody Screen: NEGATIVE
Unit division: 0

## 2023-03-13 LAB — PREPARE RBC (CROSSMATCH)

## 2023-03-13 LAB — CBC WITH DIFFERENTIAL (CANCER CENTER ONLY)
Abs Immature Granulocytes: 0.01 10*3/uL (ref 0.00–0.07)
Basophils Absolute: 0 10*3/uL (ref 0.0–0.1)
Basophils Relative: 0 %
Eosinophils Absolute: 0 10*3/uL (ref 0.0–0.5)
Eosinophils Relative: 0 %
HCT: 24.2 % — ABNORMAL LOW (ref 39.0–52.0)
Hemoglobin: 7.6 g/dL — ABNORMAL LOW (ref 13.0–17.0)
Immature Granulocytes: 0 %
Lymphocytes Relative: 18 %
Lymphs Abs: 0.7 10*3/uL (ref 0.7–4.0)
MCH: 34.7 pg — ABNORMAL HIGH (ref 26.0–34.0)
MCHC: 31.4 g/dL (ref 30.0–36.0)
MCV: 110.5 fL — ABNORMAL HIGH (ref 80.0–100.0)
Monocytes Absolute: 0.4 10*3/uL (ref 0.1–1.0)
Monocytes Relative: 11 %
Neutro Abs: 2.5 10*3/uL (ref 1.7–7.7)
Neutrophils Relative %: 71 %
Platelet Count: 124 10*3/uL — ABNORMAL LOW (ref 150–400)
RBC: 2.19 MIL/uL — ABNORMAL LOW (ref 4.22–5.81)
RDW: 17.4 % — ABNORMAL HIGH (ref 11.5–15.5)
WBC Count: 3.6 10*3/uL — ABNORMAL LOW (ref 4.0–10.5)
nRBC: 0 % (ref 0.0–0.2)

## 2023-03-13 LAB — CMP (CANCER CENTER ONLY)
ALT: 8 U/L (ref 0–44)
AST: 26 U/L (ref 15–41)
Albumin: 3.5 g/dL (ref 3.5–5.0)
Alkaline Phosphatase: 1496 U/L — ABNORMAL HIGH (ref 38–126)
Anion gap: 10 (ref 5–15)
BUN: 18 mg/dL (ref 8–23)
CO2: 19 mmol/L — ABNORMAL LOW (ref 22–32)
Calcium: 8.4 mg/dL — ABNORMAL LOW (ref 8.9–10.3)
Chloride: 107 mmol/L (ref 98–111)
Creatinine: 0.99 mg/dL (ref 0.61–1.24)
GFR, Estimated: >60 mL/min (ref >60)
Glucose, Bld: 98 mg/dL (ref 70–99)
Potassium: 4.3 mmol/L (ref 3.5–5.1)
Sodium: 136 mmol/L (ref 135–145)
Total Bilirubin: 0.4 mg/dL (ref 0.3–1.2)
Total Protein: 6.3 g/dL — ABNORMAL LOW (ref 6.5–8.1)

## 2023-03-13 LAB — IRON AND TIBC
Iron: 75 ug/dL (ref 45–182)
Saturation Ratios: 27 % (ref 17.9–39.5)
TIBC: 276 ug/dL (ref 250–450)
UIBC: 201 ug/dL

## 2023-03-13 LAB — SAMPLE TO BLOOD BANK

## 2023-03-13 LAB — FERRITIN: Ferritin: 661 ng/mL — ABNORMAL HIGH (ref 24–336)

## 2023-03-13 LAB — FOLATE: Folate: 10.3 ng/mL (ref >5.9)

## 2023-03-13 LAB — PSA: Prostatic Specific Antigen: 988.3 ng/mL — ABNORMAL HIGH (ref 0.00–4.00)

## 2023-03-13 LAB — VITAMIN B12: Vitamin B-12: 151 pg/mL — ABNORMAL LOW (ref 180–914)

## 2023-03-13 NOTE — Progress Notes (Signed)
Palliative Medicine [facility] at [facility] [phone] [phone]   Name: Eric [name] Date: 03/13/2023 MRN: [id number]  DOB: 11-17-1940  Patient Care Team: Alba Cory, MD as PCP - General (Family Medicine) Earna Coder, MD as Consulting Physician (Oncology) Riki Altes, MD (Urology)    REASON FOR CONSULTATION: Eric [name] is a 82 y.o. 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  Patient currently on treatment with ADT/Eligard/Pluvicto.  He was referred to palliative care to address goals and manage ongoing symptoms.  SOCIAL HISTORY:     reports that he has been smoking cigarettes. He has a 30.00 pack-year smoking history. He has never used smokeless tobacco. He reports that he does not currently use alcohol. He reports that he does not currently use drugs.  Patient lives at home with a girlfriend.  He has a daughter and granddaughter who live nearby and are involved.  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  ADVANCE DIRECTIVES:    CODE STATUS:   PAST MEDICAL HISTORY: Past Medical History:  Diagnosis Date   Cancer (HCC)    COPD (chronic obstructive pulmonary disease) (HCC)    Dyspnea    with exertion   Family history of cancer    GERD (gastroesophageal reflux disease)    occ   Hyperlipidemia    Hypertension     PAST SURGICAL HISTORY:  Past Surgical History:  Procedure Laterality Date   COLONOSCOPY     HEMORROIDECTOMY     IR IMAGING GUIDED PORT INSERTION  12/20/2021   PROSTATE BIOPSY N/A 10/26/2021   Procedure: PROSTATE BIOPSY;  Surgeon: Riki Altes, MD;  Location: [facility] ORS;  Service: Urology;  Laterality: N/A;   TRANSRECTAL ULTRASOUND N/A 10/26/2021   Procedure: TRANSRECTAL ULTRASOUND;  Surgeon: Riki Altes,  MD;  Location: [facility] ORS;  Service: Urology;  Laterality: N/A;    HEMATOLOGY/ONCOLOGY HISTORY:  Oncology History Overview Note  compatible with widespread metastatic prostate cancer, including innumerable sclerotic osseous lesions and extensive lymphadenopathy in the pelvis and retroperitoneum, as detailed above. 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 2. There is also an indeterminate hypovascular lesion in segment 5 of the liver which may represent a metastatic lesion. This could be better characterized with follow-up abdominal MRI with and without IV gadolinium if clinically appropriate. 3. Aortic atherosclerosis with mild fusiform aneurysmal dilatation of the infrarenal abdominal aorta measuring up to 3.2 x 3.0 cm. Recommend follow-up ultrasound every 3 years. This recommendation follows ACR consensus guidelines: White Paper of the ACR Incidental Findings Committee II on Vascular Findings. J Am Coll Radiol 2013; 10:789-794. 4. Bladder wall appears mildly thickened, likely related to bladder outlet obstruction given the enlarged prostate gland. 5. Severe colonic diverticulosis without evidence of acute diverticulitis at this time.  IMPRESSION: There are numerous foci of abnormal tracer uptake in the axial and proximal appendicular skeleton consistent with extensive skeletal metastatic disease.     #Stage IV castrate sensitive prostate cancer-December/20 2023-MRI negative for any liver lesions.; FEB 24th, 2022-Firmagon loading dose;  # march 24th-Taxoeter with udenyca; 4/12- add darolutamide  # DEC 2022- 374 [Stoioff; Urology]   Prostate cancer (HCC)  12/13/2021 Initial Diagnosis   Prostate cancer (HCC)   12/13/2021 Cancer Staging   Staging form:  Prostate, AJCC 8th Edition - Clinical: Stage IVB (cT2c, cN1, cM1, PSA: 374, Grade Group: 5) - Signed by Earna Coder, MD on 12/13/2021 Prostate specific antigen (PSA) range: 20 or greater Histologic grading system: 5 grade system   01/12/2022 - 04/28/2022 Chemotherapy   Patient is on Treatment Plan : PROSTATE Docetaxel + Prednisone q21d      Genetic Testing   Negative genetic testing. No pathogenic variants identified on the [facility] Common Hereditary Cancers +RNA panel. VUS in BRIP1 called c.485G>T identified. The report date is 03/04/2022.  The Common Hereditary Cancers Panel + RNA offered by [facility] includes sequencing and/or deletion duplication testing of the following 47 genes: APC, ATM, AXIN2, BARD1, BMPR1A, BRCA1, BRCA2, BRIP1, CDH1, CDKN2A (p14ARF), CDKN2A (p16INK4a), CKD4, CHEK2, CTNNA1, DICER1, EPCAM (Deletion/duplication testing only), GREM1 (promoter region deletion/duplication testing only), KIT, MEN1, MLH1, MSH2, MSH3, MSH6, MUTYH, NBN, NF1, NHTL1, PALB2, PDGFRA, PMS2, POLD1, POLE, PTEN, RAD50, RAD51C, RAD51D, SDHB, SDHC, SDHD, SMAD4, SMARCA4. STK11, TP53, TSC1, TSC2, and VHL.  The following genes were evaluated for sequence changes only: SDHA and HOXB13 c.251G>A variant only.     ALLERGIES:  has No Known Allergies.  MEDICATIONS:  Current Outpatient Medications  Medication Sig Dispense Refill   acetaminophen (TYLENOL) 500 MG tablet Take 1 tablet (500 mg total) by mouth every 6 (six) hours as needed. 30 tablet 0   albuterol (VENTOLIN HFA) 108 (90 Base) MCG/ACT inhaler TAKE 2 PUFFS BY MOUTH EVERY 6 HOURS AS NEEDED FOR WHEEZE OR SHORTNESS OF BREATH 18 each 5   atorvastatin (LIPITOR) 40 MG tablet TAKE 1 TABLET (40 MG TOTAL) BY MOUTH DAILY. IN PLACE OF ROSUVASTATIN DUE TO CANER TREATMENT 90 tablet 1   Ca Carbonate-Mag Hydroxide (ROLAIDS PO) Take 1 tablet by mouth as needed.     ezetimibe (ZETIA) 10 MG tablet Take 1 tablet (10 mg total) by mouth every morning. 90 tablet 1   Fluticasone-Umeclidin-Vilant (TRELEGY ELLIPTA) 100-62.5-25 MCG/ACT AEPB Inhale 1 puff into the lungs daily. 3 each 1    gabapentin (NEURONTIN) 100 MG capsule Take 1 pill at nighttime for 1 week.  If tolerating well take 2 pills at night. (Patient not taking: Reported on 08/10/2022) 60 capsule 0   lidocaine-prilocaine (EMLA) cream Apply on the port. 30 -45 min  prior to port access. 30 g 3   lisinopril (ZESTRIL) 2.5 MG tablet Take by mouth.     metoprolol succinate (TOPROL-XL) 25 MG 24 hr tablet Take 1 tablet (25 mg total) by mouth daily. Take with or immediately following a meal. 90 tablet 1   OLANZapine (ZYPREXA) 5 MG tablet TAKE 1 TABLET BY MOUTH EVERYDAY AT BEDTIME 90 tablet 2   ondansetron (ZOFRAN) 8 MG tablet One pill every 8 hours as needed for nausea/vomitting. 40 tablet 1   predniSONE (DELTASONE) 20 MG tablet Take 2 tablets (40 mg total) by mouth daily with breakfast. 60 tablet 0   prochlorperazine (COMPAZINE) 10 MG tablet Take 1 tablet (10 mg total) by mouth every 6 (six) hours as needed for nausea or vomiting. 40 tablet 1   tamsulosin (FLOMAX) 0.4 MG CAPS capsule TAKE 1 CAPSULE BY MOUTH EVERY DAY AFTER SUPPER 90 capsule 1   No current facility-administered medications for this visit.    VITAL SIGNS: There were no vitals taken for this visit. There were no vitals filed for this visit.  Estimated body mass index is 21.68 kg/m as calculated from the following:   Height as of 02/22/23: 5\' 6"  (1.676 m).  Weight as of an earlier encounter on 03/13/23: 134 lb 4.8 oz (60.9 kg).  LABS: CBC:    Component Value Date/Time   WBC 3.6 (L) 03/13/2023 1334   WBC 4.5 02/15/2023 1330   HGB 7.6 (L) 03/13/2023 1334   HCT 24.2 (L) 03/13/2023 1334   PLT 124 (L) 03/13/2023 1334   MCV 110.5 (H) 03/13/2023 1334   NEUTROABS 2.5 03/13/2023 1334   LYMPHSABS 0.7 03/13/2023 1334   MONOABS 0.4 03/13/2023 1334   EOSABS 0.0 03/13/2023 1334   BASOSABS 0.0 03/13/2023 1334   Comprehensive Metabolic Panel:    Component Value Date/Time   NA 136 03/13/2023 1334   NA 142 08/17/2015 0928   K 4.3 03/13/2023 1334   CL 107  03/13/2023 1334   CO2 19 (L) 03/13/2023 1334   BUN 18 03/13/2023 1334   BUN 17 08/17/2015 0928   CREATININE 0.99 03/13/2023 1334   CREATININE 1.06 09/30/2021 1119   GLUCOSE 98 03/13/2023 1334   CALCIUM 8.4 (L) 03/13/2023 1334   AST 26 03/13/2023 1334   ALT 8 03/13/2023 1334   ALKPHOS 1,496 (H) 03/13/2023 1334   BILITOT 0.4 03/13/2023 1334   PROT 6.3 (L) 03/13/2023 1334   PROT 6.8 08/17/2015 0928   ALBUMIN 3.5 03/13/2023 1334   ALBUMIN 4.1 08/17/2015 0928    RADIOGRAPHIC STUDIES: No results found.  PERFORMANCE STATUS (ECOG) : 1 - Symptomatic but completely ambulatory  Review of Systems Unless otherwise noted, a complete review of systems is negative.  Physical Exam General: NAD Pulmonary: Unlabored Extremities: no edema, no joint deformities Skin: no rashes Neurological: Weakness but otherwise nonfocal  IMPRESSION: I met with patient and her granddaughter to discuss goals.  Patient says he understands that his prostate cancer is incurable but he remains in agreement with current scope of treatment.  Symptomatically, he says he is doing well.  He denies significant pain or other distressing symptoms at this point.  He says that his appetite is fair.  He ambulates at home with use of a walker but remains functionally independent with his own ADLs.  He says that he has good family support with his daughter and granddaughter.  Patient does not have advance directives but was provided with ACP documents today in addition to a MOST form, which she took home to review with family.  PLAN: -Continue current scope of treatment -ACP/MOST form reviewed -Follow-up telephone visit 1 month   Patient expressed understanding and was in agreement with this plan. He also understands that He can call the clinic at any time with any questions, concerns, or complaints.     Time Total: 20 minutes  Visit consisted of counseling and education dealing with the complex and emotionally  intense issues of symptom management and palliative care in the setting of serious and potentially life-threatening illness.Greater than 50%  of this time was spent counseling and coordinating care related to the above assessment and plan.  Signed by: Laurette Schimke, PhD, NP-C

## 2023-03-13 NOTE — Progress Notes (Signed)
[facility] CONSULT NOTE  Patient Care Team: Alba Cory, MD as PCP - General (Family Medicine) Earna Coder, MD as Consulting Physician (Oncology) Riki Altes, MD (Urology)  CHIEF COMPLAINTS/PURPOSE OF CONSULTATION: prostate cancer  Oncology History Overview Note  compatible with widespread metastatic prostate cancer, including innumerable sclerotic osseous lesions and extensive lymphadenopathy in the pelvis and retroperitoneum, as detailed above. 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 2. There is also an indeterminate hypovascular lesion in segment 5 of the liver which may represent a metastatic lesion. This could be better characterized with follow-up abdominal MRI with and without IV gadolinium if clinically appropriate. 3. Aortic atherosclerosis with mild fusiform aneurysmal dilatation of the infrarenal abdominal aorta measuring up to 3.2 x 3.0 cm. Recommend follow-up ultrasound every 3 years. This recommendation follows ACR consensus guidelines: White Paper of the ACR Incidental Findings Committee II on Vascular Findings. J Am Coll Radiol 2013; 10:789-794. 4. Bladder wall appears mildly thickened, likely related to bladder outlet obstruction given the enlarged prostate gland. 5. Severe colonic diverticulosis without evidence of acute diverticulitis at this time.  IMPRESSION: There are numerous foci of abnormal tracer uptake in the axial and proximal appendicular skeleton consistent with extensive skeletal metastatic disease.     #Stage IV castrate sensitive prostate cancer-December/20 2023-MRI negative for any liver lesions.; FEB 24th, 2022-Firmagon loading dose;  # march 24th-Taxoeter with udenyca; 4/12- add darolutamide  # DEC 2022- 374 [Stoioff; Urology]   Prostate cancer (HCC)  12/13/2021 Initial Diagnosis   Prostate  cancer (HCC)   12/13/2021 Cancer Staging   Staging form: Prostate, AJCC 8th Edition - Clinical: Stage IVB (cT2c, cN1, cM1, PSA: 374, Grade Group: 5) - Signed by Earna Coder, MD on 12/13/2021 Prostate specific antigen (PSA) range: 20 or greater Histologic grading system: 5 grade system   01/12/2022 - 04/28/2022 Chemotherapy   Patient is on Treatment Plan : PROSTATE Docetaxel + Prednisone q21d      Genetic Testing   Negative genetic testing. No pathogenic variants identified on the [facility] Common Hereditary Cancers +RNA panel. VUS in BRIP1 called c.485G>T identified. The report date is 03/04/2022.  The Common Hereditary Cancers Panel + RNA offered by [facility] includes sequencing and/or deletion duplication testing of the following 47 genes: APC, ATM, AXIN2, BARD1, BMPR1A, BRCA1, BRCA2, BRIP1, CDH1, CDKN2A (p14ARF), CDKN2A (p16INK4a), CKD4, CHEK2, CTNNA1, DICER1, EPCAM (Deletion/duplication testing only), GREM1 (promoter region deletion/duplication testing only), KIT, MEN1, MLH1, MSH2, MSH3, MSH6, MUTYH, NBN, NF1, NHTL1, PALB2, PDGFRA, PMS2, POLD1, POLE, PTEN, RAD50, RAD51C, RAD51D, SDHB, SDHC, SDHD, SMAD4, SMARCA4. STK11, TP53, TSC1, TSC2, and VHL.  The following genes were evaluated for sequence changes only: SDHA and HOXB13 c.251G>A variant only.    HISTORY OF PRESENTING ILLNESS: Patient ambulating with a  cane.   Accompanied by grand-daughter.   Eric [name] 82 y.o.  male pleasant patient above history of castrate resistant metastatic prostate cancer currently currently Eligard plus pluvicto is here for follow-up.    Patient currently s/p cycle #3 on April 18.    C/o being dizzy at time.  No falls.    Appetite is improving.  Patient continues on Zyprexa 5 mg.  No nausea no vomiting.  No sores in the mouth.  Denies any blood in stools or black or stools.  Review of Systems  Constitutional:  Positive for malaise/fatigue and weight loss. Negative for chills, diaphoresis and fever.  HENT:  Negative for nosebleeds and sore throat.   Eyes:  Negative for double vision.  Respiratory:  Positive for shortness of breath. Negative for cough, hemoptysis, sputum production and wheezing.   Cardiovascular:  Negative for chest pain, palpitations, orthopnea and leg swelling.  Gastrointestinal:  Negative for abdominal pain, blood in stool, constipation, diarrhea, heartburn, melena, nausea and vomiting.  Genitourinary:  Negative for dysuria, frequency and urgency.  Musculoskeletal:  Positive for back pain and joint pain.  Skin: Negative.  Negative for itching and rash.  Neurological:  Negative for dizziness, tingling, focal weakness, weakness and headaches.  Endo/Heme/Allergies:  Does not bruise/bleed easily.  Psychiatric/Behavioral:  Negative for depression. The patient is not nervous/anxious and does not have insomnia.     MEDICAL HISTORY:  Past Medical History:  Diagnosis Date   Cancer (HCC)    COPD (chronic obstructive pulmonary disease) (HCC)    Dyspnea    with exertion   Family history of cancer    GERD (gastroesophageal reflux disease)    occ   Hyperlipidemia    Hypertension     SURGICAL HISTORY: Past Surgical History:  Procedure Laterality Date   COLONOSCOPY     HEMORROIDECTOMY     IR IMAGING GUIDED PORT INSERTION  12/20/2021   PROSTATE BIOPSY N/A 10/26/2021   Procedure: PROSTATE BIOPSY;  Surgeon: Riki Altes, MD;  Location: [facility] ORS;  Service: Urology;  Laterality: N/A;   TRANSRECTAL ULTRASOUND N/A 10/26/2021   Procedure: TRANSRECTAL ULTRASOUND;  Surgeon: Riki Altes, MD;  Location: [facility] ORS;  Service: Urology;  Laterality: N/A;    SOCIAL HISTORY: Social History   Socioeconomic History   Marital status: Single    Spouse name: Not on file   Number of children: 1   Years of education: Not on file   Highest education level: High school graduate  Occupational History   Occupation: retired   Tobacco Use   Smoking status: Every Day    Packs/day:  0.50    Years: 60.00    Additional pack years: 0.00    Total pack years: 30.00    Types: Cigarettes   Smokeless tobacco: Never   Tobacco comments:    smoking cessation information provided  Vaping Use   Vaping Use: Never used  Substance and Sexual Activity   Alcohol use: Not Currently   Drug use: Not Currently   Sexual activity: Not Currently  Other Topics Concern   Not on file  Social History Narrative   Single, lives with male friend Rock Nephew is care giver] for the past 20 plus years. 1/2 ppd smoke; no alcohol. 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 Daughter lives 10-15 mins; in [place].    Social Determinants of Health   Financial Resource Strain: Low Risk  (02/10/2022)   Overall Financial Resource Strain (CARDIA)    Difficulty of Paying Living Expenses: Not hard at all  Food Insecurity: No Food Insecurity (02/10/2022)   Hunger Vital Sign    Worried About Running Out of Food in the Last Year: Never true    Ran Out of Food in the Last Year: Never true  Transportation Needs: No Transportation Needs (02/10/2022)   PRAPARE - Administrator, Civil Service (Medical): No    Lack of Transportation (Non-Medical): No  Physical Activity: Inactive (02/10/2022)   Exercise Vital Sign    Days of Exercise per Week: 0 days    Minutes of Exercise per Session: 0 min  Stress: No Stress Concern Present (  02/10/2022)   [employer] [employer] - Occupational Stress Questionnaire    Feeling of Stress : Only a little  Social Connections: Socially Isolated (02/10/2022)   Social Connection and Isolation Panel [NHANES]    Frequency of Communication with Friends and Family: More than three times a week    Frequency of Social Gatherings with Friends and Family: Three times a week    Attends Religious Services: Never    Active Member of Clubs or Organizations: No    Attends Banker Meetings: Never    Marital Status: Widowed  Intimate Partner  Violence: Not At Risk (02/10/2022)   Humiliation, Afraid, Rape, and Kick questionnaire    Fear of Current or Ex-Partner: No    Emotionally Abused: No    Physically Abused: No    Sexually Abused: No    FAMILY HISTORY: Family History  Problem Relation Age of Onset   Heart disease Mother    COPD Father    Cancer Brother 48       blood cancer; unk exact type   Cancer Maternal Aunt        unk type   Cancer Paternal Aunt        unk type   Cancer Paternal Uncle        one unk type; one had throat   Cancer Cousin        unk types    ALLERGIES:  has No Known Allergies.  MEDICATIONS:  Current Outpatient Medications  Medication Sig Dispense Refill   acetaminophen (TYLENOL) 500 MG tablet Take 1 tablet (500 mg total) by mouth every 6 (six) hours as needed. 30 tablet 0   albuterol (VENTOLIN HFA) 108 (90 Base) MCG/ACT inhaler TAKE 2 PUFFS BY MOUTH EVERY 6 HOURS AS NEEDED FOR WHEEZE OR SHORTNESS OF BREATH 18 each 5   atorvastatin (LIPITOR) 40 MG tablet TAKE 1 TABLET (40 MG TOTAL) BY MOUTH DAILY. IN PLACE OF ROSUVASTATIN DUE TO CANER TREATMENT 90 tablet 1   Ca Carbonate-Mag Hydroxide (ROLAIDS PO) Take 1 tablet by mouth as needed.     ezetimibe (ZETIA) 10 MG tablet Take 1 tablet (10 mg total) by mouth every morning. 90 tablet 1   Fluticasone-Umeclidin-Vilant (TRELEGY ELLIPTA) 100-62.5-25 MCG/ACT AEPB Inhale 1 puff into the lungs daily. 3 each 1   lidocaine-prilocaine (EMLA) cream Apply on the port. 30 -45 min  prior to port access. 30 g 3   lisinopril (ZESTRIL) 2.5 MG tablet Take by mouth.     metoprolol succinate (TOPROL-XL) 25 MG 24 hr tablet Take 1 tablet (25 mg total) by mouth daily. Take with or immediately following a meal. 90 tablet 1   OLANZapine (ZYPREXA) 5 MG tablet TAKE 1 TABLET BY MOUTH EVERYDAY AT BEDTIME 90 tablet 2   ondansetron (ZOFRAN) 8 MG tablet One pill every 8 hours as needed for nausea/vomitting. 40 tablet 1   predniSONE (DELTASONE) 20 MG tablet Take 2 tablets (40 mg  total) by mouth daily with breakfast. 60 tablet 0   prochlorperazine (COMPAZINE) 10 MG tablet Take 1 tablet (10 mg total) by mouth every 6 (six) hours as needed for nausea or vomiting. 40 tablet 1   tamsulosin (FLOMAX) 0.4 MG CAPS capsule TAKE 1 CAPSULE BY MOUTH EVERY DAY AFTER SUPPER 90 capsule 1   gabapentin (NEURONTIN) 100 MG capsule Take 1 pill at nighttime for 1 week.  If tolerating well take 2 pills at night. (Patient not taking: Reported on 08/10/2022) 60 capsule 0   No  current facility-administered medications for this visit.    PHYSICAL EXAMINATION:   Vitals:   03/13/23 1413  BP: (!) 123/50  Pulse: 95  Temp: (!) 96.9 F (36.1 C)  SpO2: 100%   Filed Weights   03/13/23 1413  Weight: 134 lb 4.8 oz (60.9 kg)    Physical Exam Vitals and nursing note reviewed.  HENT:     Head: Normocephalic and atraumatic.     Mouth/Throat:     Pharynx: Oropharynx is clear.  Eyes:     Extraocular Movements: Extraocular movements intact.     Pupils: Pupils are equal, round, and reactive to light.  Cardiovascular:     Rate and Rhythm: Normal rate and regular rhythm.  Pulmonary:     Comments: Decreased breath sounds bilaterally.  Abdominal:     Palpations: Abdomen is soft.  Musculoskeletal:        General: Normal range of motion.     Cervical back: Normal range of motion.  Skin:    General: Skin is warm.  Neurological:     General: No focal deficit present.     Mental Status: He is alert and oriented to person, place, and time.  Psychiatric:        Behavior: Behavior normal.        Judgment: Judgment normal.     LABORATORY DATA:  I have reviewed the data as listed Lab Results  Component Value Date   WBC 3.6 (L) 03/13/2023   HGB 7.6 (L) 03/13/2023   HCT 24.2 (L) 03/13/2023   MCV 110.5 (H) 03/13/2023   PLT 124 (L) 03/13/2023   Recent Labs    02/15/23 1330 02/22/23 1319 03/13/23 1334  NA 137 134* 136  K 3.9 4.1 4.3  CL 110 106 107  CO2 21* 18* 19*  GLUCOSE 128*  122* 98  BUN 12 12 18   CREATININE 0.83 0.86 0.99  CALCIUM 8.3* 8.1* 8.4*  GFRNONAA >60 >60 >60  PROT 6.2* 6.0* 6.3*  ALBUMIN 3.2* 3.2* 3.5  AST 22 22 26   ALT 8 8 8   ALKPHOS 989* 970* 1,496*  BILITOT 0.3 0.3 0.4     RADIOGRAPHIC STUDIES: I have personally reviewed the radiological images as listed and agreed with the findings in the report. No results found.  Assessment & Plan:  #Stage IV-castrate resistant prostate cancer [OCT 2023] with multiple bone metastases/extensive lymphadenopathy; Currently s/p cycle # 6 of Taxotere [60 mg per metered square]- finished JULY, 2023. NOV 2023- PET scan showed multiple progressive bone lesions; shows no focal activity in the prostate/abdominal lymph nodes. PSA rising-concerning for progressive disease.   # Currently on ADT- Eligard + Pluvicto q 6 W- with Dr. Roseanne Reno. Currently s/p # 3 on 02/09/23 of planned 6 treatments. Tolerating fairly well. PSA has been stable. Pending today. Proceed with Pluvicto #4 on 5/21. Plan to transfuse prior to pluvicto.    # Weight loss/dyspnea/fatigue- likely secondary to progression of disease. Continue Zyprexa at nighttime. Currently off prednisone. Weight is stable. Symptomatically. Stable.    # PN- from taxotere- G-2-3; Right UE weakness- S/p evaluation to Dr. Cannon Kettle post MRI of the cervical spine. Stable.    # Hematology: Anemia/thrombocytopenia-  secondary to underlying prostate cancer/chronic disease and treatment. FEB 2024- No Iron def;  Prn PRBC transfusion.  Hemoglobin 7.6 today- Plan for transfusion of 1 unit of pRBCs tomorrow. Reviewed risks and he agrees to proceed. Irradiated given pancytopenia and pluvicto treatments. Repeat Iron and b12 studies pending.    # Prostatism symptoms:  Continue Flomax. Stable.    # Diffuse bone metastases/castrate resistant prostate cancer -currently on Zometa approx every 2 months. On hold for now. Jan 2024 vitamin D 42. Calcium 8.4. Not taking oral calcium d/t pill  size. Recommended cutting or crushing. Take calcium 1200 mg daily along with 800 iu of vitamin D.    # IV access: Port-a-cath: continue emla cream and flush every 2-3 months.     # Vaccination: s/p flu shot; reocmmend COVID; RSV    # eligard q 36M- last jan 17th, 2024 ; [prefers 1-2 pm]; zometa on 11/09/2022   # DISPOSITION:   Blood tomorrow as scheduled Follow up as scheduled or rtc sooner as needed  No problem-specific Assessment & Plan notes found for this encounter.   Alinda Dooms, NP 03/13/2023

## 2023-03-14 ENCOUNTER — Inpatient Hospital Stay: Payer: Medicare HMO

## 2023-03-14 DIAGNOSIS — C61 Malignant neoplasm of prostate: Secondary | ICD-10-CM

## 2023-03-14 DIAGNOSIS — D649 Anemia, unspecified: Secondary | ICD-10-CM

## 2023-03-14 LAB — TYPE AND SCREEN

## 2023-03-14 MED ORDER — HEPARIN SOD (PORK) LOCK FLUSH 100 UNIT/ML IV SOLN
500.0000 [IU] | Freq: Every day | INTRAVENOUS | Status: AC | PRN
  Administered 2023-03-14: 500 [IU]
  Filled 2023-03-14: qty 5

## 2023-03-14 MED ORDER — SODIUM CHLORIDE 0.9% IV SOLUTION
250.0000 mL | Freq: Once | INTRAVENOUS | Status: AC
  Administered 2023-03-14: 250 mL via INTRAVENOUS
  Filled 2023-03-14: qty 250

## 2023-03-15 LAB — BPAM RBC
Blood Product Expiration Date: 202405312359
ISSUE DATE / TIME: 202405210911
Unit Type and Rh: 5100

## 2023-03-15 LAB — TYPE AND SCREEN

## 2023-03-16 ENCOUNTER — Telehealth: Payer: Self-pay

## 2023-03-16 NOTE — Telephone Encounter (Signed)
03/16/2023 01:06 PM EDT by Sue Lush, LPN  Outgoing Vivek, Orahood (Self) [phone] (Home) Not Available - called pt to do AWV: voicemail not available.   03/16/2023 01:14 PM EDT by Sue Lush, LPN  Outgoing Jazhiel, Neupert (Self) [phone] (Home) Calling pt to do AWV. pt's chart notes him to be at [facility]

## 2023-03-17 ENCOUNTER — Telehealth: Payer: Self-pay

## 2023-03-17 NOTE — Telephone Encounter (Signed)
PC outreach to patient. Unable to LVM due mailbox not being set up. Will attempt again at later date/time.

## 2023-03-21 ENCOUNTER — Telehealth: Payer: Self-pay

## 2023-03-21 NOTE — Written Directive (Addendum)
  PLUVICTO  THERAPY   RADIOPHARMACEUTICAL: Lutetium 177 vipivotide tetraxetan (Pluvicto)     PRESCRIBED DOSE FOR ADMINISTRATION:  160 mCi   ROUTE OFADMINISTRATION:  IV   DIAGNOSIS:  Prostate Cancer    REFERRING PHYSICIAN: Dr. [name]    TREATMENT #: 4    ADDITIONAL PHYSICIAN COMMENTS/NOTES: Reduced dose.. stable anemia   AUTHORIZED USER SIGNATURE & TIME STAMP: Patriciaann Clan, MD   03/23/23    11:47 AM

## 2023-03-21 NOTE — Telephone Encounter (Signed)
1155 am.  New Palliative Care Referral received.  Phone call made to patient to schedule a home visit.  Patient agreeable to tomorrow at 2 pm.

## 2023-03-22 ENCOUNTER — Other Ambulatory Visit: Payer: Self-pay | Admitting: *Deleted

## 2023-03-22 ENCOUNTER — Other Ambulatory Visit: Payer: Self-pay

## 2023-03-22 DIAGNOSIS — Z515 Encounter for palliative care: Secondary | ICD-10-CM

## 2023-03-22 DIAGNOSIS — C61 Malignant neoplasm of prostate: Secondary | ICD-10-CM

## 2023-03-22 NOTE — Progress Notes (Signed)
PATIENT NAME: Eric [name] DOB: [birth year] MRN: [id number]  PRIMARY CARE PROVIDER: Alba Cory, MD  RESPONSIBLE PARTY:  Acct ID - Guarantor Home Phone Work Phone Relationship Acct Type  [id number] RIGDON, RECCHIA* [phone]  Self P/F     [address], [place], [place] [address]   Arrived at patient's home for scheduled Palliative Care visit.  No answer at the door.  Phone call made to home and girlfriend answered phone.  She states patient is not home and had another appointment this afternoon.   States patient will likely not be able to meet with Palliative Care due to multiple appointments in the next week.  She has requested that I call back at a later time to speak with patient regarding desire to proceed with Palliative Care Services.  Advised I would follow up in 1 week.  Truitt Merle, RN

## 2023-03-23 ENCOUNTER — Ambulatory Visit (HOSPITAL_COMMUNITY)
Admission: RE | Admit: 2023-03-23 | Discharge: 2023-03-23 | Disposition: A | Discharge status: Home / Self Care | Payer: Medicare HMO | Source: Ambulatory Visit | Attending: Internal Medicine | Admitting: Internal Medicine

## 2023-03-23 VITALS — BP 130/55 | HR 90

## 2023-03-23 DIAGNOSIS — C61 Malignant neoplasm of prostate: Secondary | ICD-10-CM | POA: Diagnosis present

## 2023-03-23 LAB — CBC
HCT: 29.8 % — ABNORMAL LOW (ref 39.0–52.0)
Hemoglobin: 9.3 g/dL — ABNORMAL LOW (ref 13.0–17.0)
MCH: 32.3 pg (ref 26.0–34.0)
MCHC: 31.2 g/dL (ref 30.0–36.0)
MCV: 103.5 fL — ABNORMAL HIGH (ref 80.0–100.0)
Platelets: 123 10*3/uL — ABNORMAL LOW (ref 150–400)
RBC: 2.88 MIL/uL — ABNORMAL LOW (ref 4.22–5.81)
RDW: 19.3 % — ABNORMAL HIGH (ref 11.5–15.5)
WBC: 4.4 10*3/uL (ref 4.0–10.5)
nRBC: 0 % (ref 0.0–0.2)

## 2023-03-23 LAB — PSA: Prostatic Specific Antigen: >1500 ng/mL — ABNORMAL HIGH (ref 0.00–4.00)

## 2023-03-23 MED ORDER — SODIUM CHLORIDE 0.9 % IV SOLN: INTRAVENOUS | Status: DC

## 2023-03-23 MED ORDER — SODIUM CHLORIDE 0.9 % IV BOLUS: 1000.0000 mL | Freq: Once | INTRAVENOUS | Status: DC

## 2023-03-23 MED ORDER — LUTETIUM LU 177 VIPIVOTIDE TET 1000 MBQ/ML IV SOLN
161.6000 | Freq: Once | INTRAVENOUS | Status: AC
  Administered 2023-03-23: 161.6 via INTRAVENOUS

## 2023-03-23 NOTE — Progress Notes (Signed)
CLINICAL DATA: [ 82 year old male with metastatic prostate carcinoma. Extensive PSMA positive metastatic skeletal disease.]  EXAM: NUCLEAR MEDICINE PLUVICTO INJECTION  TECHNIQUE: Infusion: The nuclear medicine technologist and I personally verified the dose activity to be delivered as specified in the written directive, and verified the patient identification via 2 separate methods.  Initial flush of the intravenous catheter was performed was sterile saline. The dose syringe was connected to the catheter and the Lu-177 Pluvicto administered over a 1 to 10 min infusion. Single 10 cc  lushes with normal saline follow the dose. No complications were noted. The entire IV tubing, venocatheter, stopcock and syringes was removed in total, placed in a disposal bag and sent for assay of the residual activity, which will be reported at a later time in our EMR by the physics staff. Pressure was applied to the venipuncture site, and a compression bandage placed. Patient monitored for 1 hour following infusion.    Radiation Safety personnel were present to perform the discharge survey, as detailed on their documentation. After a short period of observation, the patient had his IV removed.  RADIOPHARMACEUTICALS: [161.6] mCi Lu-177 PLUVICTO  (20% dose reduction)  FINDINGS: Current Infusion: [4]  Planned Infusions: 6    Patient presented to nuclear medicine for treatment. The patient's most recent blood counts were reviewed and remains a adequate candidate to proceed with Lu-177 Pluvicto.   Patient reports no adverse effects.  Patient does report hesitancy initiating urination in the morning.    PSA is increased to 1,500 on 03/23/2023 compared to 1,085 on 02/22/2023 and 1027 on 01/09/2023   Patient remains anemic with hemoglobin equal 9.3 comparable to 8.71 month prior.  Patient recent received transfusion in the interval. No change in 20% dose reduction.    Platelets decrease at  123 compared to  165 one  month prior.     Renal function remains normal.     The patient was situated in an infusion suite with a contact barrier placed under the arm. Intravenous access was established, using sterile technique, and a normal saline infusion from a syringe was started.     Micro-dosimetry: The prescribed radiation activity was assayed and confirmed to be within specified tolerance.  IMPRESSION: Current Infusion: [4]  Planned Infusions: 6    [The patient tolerated the infusion well. The patient will return in 6 weeks  month for ongoing care.]

## 2023-03-24 ENCOUNTER — Inpatient Hospital Stay: Payer: Medicare HMO

## 2023-03-24 ENCOUNTER — Encounter: Payer: Self-pay | Admitting: Internal Medicine

## 2023-03-24 ENCOUNTER — Inpatient Hospital Stay (HOSPITAL_BASED_OUTPATIENT_CLINIC_OR_DEPARTMENT_OTHER): Payer: Medicare HMO | Admitting: Internal Medicine

## 2023-03-24 ENCOUNTER — Other Ambulatory Visit: Payer: Self-pay | Admitting: Internal Medicine

## 2023-03-24 ENCOUNTER — Inpatient Hospital Stay (HOSPITAL_BASED_OUTPATIENT_CLINIC_OR_DEPARTMENT_OTHER): Payer: Medicare HMO | Admitting: Hospice and Palliative Medicine

## 2023-03-24 VITALS — BP 114/58 | HR 100 | Temp 96.8°F | Resp 21 | Ht 66.0 in | Wt 134.4 lb

## 2023-03-24 DIAGNOSIS — F17210 Nicotine dependence, cigarettes, uncomplicated: Secondary | ICD-10-CM | POA: Diagnosis not present

## 2023-03-24 DIAGNOSIS — C7951 Secondary malignant neoplasm of bone: Secondary | ICD-10-CM

## 2023-03-24 DIAGNOSIS — C61 Malignant neoplasm of prostate: Secondary | ICD-10-CM | POA: Diagnosis not present

## 2023-03-24 DIAGNOSIS — D630 Anemia in neoplastic disease: Secondary | ICD-10-CM

## 2023-03-24 DIAGNOSIS — Z809 Family history of malignant neoplasm, unspecified: Secondary | ICD-10-CM

## 2023-03-24 DIAGNOSIS — D6959 Other secondary thrombocytopenia: Secondary | ICD-10-CM

## 2023-03-24 DIAGNOSIS — R634 Abnormal weight loss: Secondary | ICD-10-CM

## 2023-03-24 DIAGNOSIS — R5383 Other fatigue: Secondary | ICD-10-CM

## 2023-03-24 LAB — CBC WITH DIFFERENTIAL (CANCER CENTER ONLY)
Abs Immature Granulocytes: 0.02 10*3/uL (ref 0.00–0.07)
Basophils Absolute: 0 10*3/uL (ref 0.0–0.1)
Basophils Relative: 0 %
Eosinophils Absolute: 0 10*3/uL (ref 0.0–0.5)
Eosinophils Relative: 0 %
HCT: 28.3 % — ABNORMAL LOW (ref 39.0–52.0)
Hemoglobin: 8.9 g/dL — ABNORMAL LOW (ref 13.0–17.0)
Immature Granulocytes: 1 %
Lymphocytes Relative: 22 %
Lymphs Abs: 0.8 10*3/uL (ref 0.7–4.0)
MCH: 32.5 pg (ref 26.0–34.0)
MCHC: 31.4 g/dL (ref 30.0–36.0)
MCV: 103.3 fL — ABNORMAL HIGH (ref 80.0–100.0)
Monocytes Absolute: 0.3 10*3/uL (ref 0.1–1.0)
Monocytes Relative: 8 %
Neutro Abs: 2.6 10*3/uL (ref 1.7–7.7)
Neutrophils Relative %: 69 %
Platelet Count: 114 10*3/uL — ABNORMAL LOW (ref 150–400)
RBC: 2.74 MIL/uL — ABNORMAL LOW (ref 4.22–5.81)
RDW: 19.3 % — ABNORMAL HIGH (ref 11.5–15.5)
WBC Count: 3.8 10*3/uL — ABNORMAL LOW (ref 4.0–10.5)
nRBC: 0 % (ref 0.0–0.2)

## 2023-03-24 LAB — SAMPLE TO BLOOD BANK

## 2023-03-24 LAB — CMP (CANCER CENTER ONLY)
ALT: 9 U/L (ref 0–44)
AST: 25 U/L (ref 15–41)
Albumin: 3.2 g/dL — ABNORMAL LOW (ref 3.5–5.0)
Alkaline Phosphatase: 922 U/L — ABNORMAL HIGH (ref 38–126)
Anion gap: 8 (ref 5–15)
BUN: 18 mg/dL (ref 8–23)
CO2: 19 mmol/L — ABNORMAL LOW (ref 22–32)
Calcium: 8.5 mg/dL — ABNORMAL LOW (ref 8.9–10.3)
Chloride: 109 mmol/L (ref 98–111)
Creatinine: 0.96 mg/dL (ref 0.61–1.24)
GFR, Estimated: >60 mL/min (ref >60)
Glucose, Bld: 124 mg/dL — ABNORMAL HIGH (ref 70–99)
Potassium: 4.1 mmol/L (ref 3.5–5.1)
Sodium: 136 mmol/L (ref 135–145)
Total Bilirubin: 0.5 mg/dL (ref 0.3–1.2)
Total Protein: 6.7 g/dL (ref 6.5–8.1)

## 2023-03-24 LAB — PSA: Prostatic Specific Antigen: >1500 ng/mL — ABNORMAL HIGH (ref 0.00–4.00)

## 2023-03-24 MED ORDER — OLANZAPINE 5 MG PO TABS: ORAL_TABLET | ORAL | 2 refills | Status: DC

## 2023-03-24 MED ORDER — TAMSULOSIN HCL 0.4 MG PO CAPS: 0.4000 mg | ORAL_CAPSULE | Freq: Every day | ORAL | 1 refills | Status: DC

## 2023-03-24 NOTE — Progress Notes (Signed)
Palliative Medicine [facility] Telephone:([phone]) [phone] Fax:(336) [phone]   Name: Eric [name] Date: 03/24/2023 MRN: [id number]  DOB: [birth year]  Patient Care Team: Alba Cory, MD as PCP - General (Family Medicine) Earna Coder, MD as Consulting Physician (Oncology) Riki Altes, MD (Urology)    REASON FOR CONSULTATION: Eric [name] is a 82 y.o. 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  Patient currently on treatment with ADT/Eligard/Pluvicto.  He was referred to palliative care to address goals and manage ongoing symptoms.  SOCIAL HISTORY:     reports that he has been smoking cigarettes. He has a 30.00 pack-year smoking history. He has never used smokeless tobacco. He reports that he does not currently use alcohol. He reports that he does not currently use drugs.  Patient lives at home with a girlfriend.  He has a daughter and granddaughter who live nearby and are involved.  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  ADVANCE DIRECTIVES:    CODE STATUS:   PAST MEDICAL HISTORY: Past Medical History:  Diagnosis Date   Cancer (HCC)    COPD (chronic obstructive pulmonary disease) (HCC)    Dyspnea    with exertion   Family history of cancer    GERD (gastroesophageal reflux disease)    occ   Hyperlipidemia    Hypertension     PAST SURGICAL HISTORY:  Past Surgical History:  Procedure Laterality Date   COLONOSCOPY     HEMORROIDECTOMY     IR IMAGING GUIDED PORT INSERTION  12/20/2021   PROSTATE BIOPSY N/A 10/26/2021   Procedure: PROSTATE BIOPSY;  Surgeon: Riki Altes, MD;  Location: [facility] ORS;  Service: Urology;  Laterality: N/A;   TRANSRECTAL ULTRASOUND N/A 10/26/2021   Procedure: TRANSRECTAL ULTRASOUND;  Surgeon: Riki Altes,  MD;  Location: [facility] ORS;  Service: Urology;  Laterality: N/A;    HEMATOLOGY/ONCOLOGY HISTORY:  Oncology History Overview Note  compatible with widespread metastatic prostate cancer, including innumerable sclerotic osseous lesions and extensive lymphadenopathy in the pelvis and retroperitoneum, as detailed above. 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 2. There is also an indeterminate hypovascular lesion in segment 5 of the liver which may represent a metastatic lesion. This could be better characterized with follow-up abdominal MRI with and without IV gadolinium if clinically appropriate. 3. Aortic atherosclerosis with mild fusiform aneurysmal dilatation of the infrarenal abdominal aorta measuring up to 3.2 x 3.0 cm. Recommend follow-up ultrasound every 3 years. This recommendation follows ACR consensus guidelines: White Paper of the ACR Incidental Findings Committee II on Vascular Findings. J Am Coll Radiol 2013; 10:789-794. 4. Bladder wall appears mildly thickened, likely related to bladder outlet obstruction given the enlarged prostate gland. 5. Severe colonic diverticulosis without evidence of acute diverticulitis at this time.  IMPRESSION: There are numerous foci of abnormal tracer uptake in the axial and proximal appendicular skeleton consistent with extensive skeletal metastatic disease.     #Stage IV castrate sensitive prostate cancer-December/20 2023-MRI negative for any liver lesions.; FEB 24th, 2022-Firmagon loading dose;  # march 24th-Taxoeter with udenyca; 4/12- add darolutamide  # DEC 2022- 374 [[facility]; Urology]   Prostate cancer (HCC)  12/13/2021 Initial Diagnosis   Prostate cancer (HCC)   12/13/2021 Cancer Staging   Staging form:  Prostate, AJCC 8th Edition - Clinical: Stage IVB (cT2c, cN1, cM1, PSA: 374, Grade Group: 5) - Signed by Earna Coder, MD on 12/13/2021 Prostate specific antigen (PSA) range: 20 or greater Histologic grading system: 5 grade system   01/12/2022 - 04/28/2022 Chemotherapy   Patient is on Treatment Plan : PROSTATE Docetaxel + Prednisone q21d      Genetic Testing   Negative genetic testing. No pathogenic variants identified on the [facility] Common Hereditary Cancers +RNA panel. VUS in BRIP1 called c.485G>T identified. The report date is 03/04/2022.  The Common Hereditary Cancers Panel + RNA offered by [facility] includes sequencing and/or deletion duplication testing of the following 47 genes: APC, ATM, AXIN2, BARD1, BMPR1A, BRCA1, BRCA2, BRIP1, CDH1, CDKN2A (p14ARF), CDKN2A (p16INK4a), CKD4, CHEK2, CTNNA1, DICER1, EPCAM (Deletion/duplication testing only), GREM1 (promoter region deletion/duplication testing only), KIT, MEN1, MLH1, MSH2, MSH3, MSH6, MUTYH, NBN, NF1, NHTL1, PALB2, PDGFRA, PMS2, POLD1, POLE, PTEN, RAD50, RAD51C, RAD51D, SDHB, SDHC, SDHD, SMAD4, SMARCA4. STK11, TP53, TSC1, TSC2, and VHL.  The following genes were evaluated for sequence changes only: SDHA and HOXB13 c.251G>A variant only.     ALLERGIES:  has No Known Allergies.  MEDICATIONS:  Current Outpatient Medications  Medication Sig Dispense Refill   acetaminophen (TYLENOL) 500 MG tablet Take 1 tablet (500 mg total) by mouth every 6 (six) hours as needed. 30 tablet 0   albuterol (VENTOLIN HFA) 108 (90 Base) MCG/ACT inhaler TAKE 2 PUFFS BY MOUTH EVERY 6 HOURS AS NEEDED FOR WHEEZE OR SHORTNESS OF BREATH 18 each 5   atorvastatin (LIPITOR) 40 MG tablet TAKE 1 TABLET (40 MG TOTAL) BY MOUTH DAILY. IN PLACE OF ROSUVASTATIN DUE TO CANER TREATMENT 90 tablet 1   Ca Carbonate-Mag Hydroxide (ROLAIDS PO) Take 1 tablet by mouth as needed.     ezetimibe (ZETIA) 10 MG tablet Take 1 tablet (10 mg total) by mouth every morning. 90 tablet 1   Fluticasone-Umeclidin-Vilant (TRELEGY ELLIPTA) 100-62.5-25 MCG/ACT AEPB Inhale 1 puff into the lungs daily. 3 each 1    gabapentin (NEURONTIN) 100 MG capsule Take 1 pill at nighttime for 1 week.  If tolerating well take 2 pills at night. (Patient not taking: Reported on 08/10/2022) 60 capsule 0   lidocaine-prilocaine (EMLA) cream Apply on the port. 30 -45 min  prior to port access. 30 g 3   lisinopril (ZESTRIL) 2.5 MG tablet Take by mouth.     metoprolol succinate (TOPROL-XL) 25 MG 24 hr tablet Take 1 tablet (25 mg total) by mouth daily. Take with or immediately following a meal. 90 tablet 1   OLANZapine (ZYPREXA) 5 MG tablet TAKE 1 TABLET BY MOUTH EVERYDAY AT BEDTIME 90 tablet 2   ondansetron (ZOFRAN) 8 MG tablet One pill every 8 hours as needed for nausea/vomitting. (Patient not taking: Reported on 03/24/2023) 40 tablet 1   prochlorperazine (COMPAZINE) 10 MG tablet Take 1 tablet (10 mg total) by mouth every 6 (six) hours as needed for nausea or vomiting. 40 tablet 1   tamsulosin (FLOMAX) 0.4 MG CAPS capsule Take 1 capsule (0.4 mg total) by mouth daily after supper. 90 capsule 1   No current facility-administered medications for this visit.   Facility-Administered Medications Ordered in Other Visits  Medication Dose Route Frequency Provider Last Rate Last Admin   sodium chloride 0.9 % bolus 1,000 mL  1,000 mL Intravenous Once Lorna Few, MD        VITAL SIGNS: There were no vitals taken for this visit. There were no vitals filed for this  visit.  Estimated body mass index is 21.69 kg/m as calculated from the following:   Height as of an earlier encounter on 03/24/23: 5\' 6"  (1.676 m).   Weight as of an earlier encounter on 03/24/23: 134 lb 6.4 oz (61 kg).  LABS: CBC:    Component Value Date/Time   WBC 3.8 (L) 03/24/2023 1252   WBC 4.4 03/23/2023 1122   HGB 8.9 (L) 03/24/2023 1252   HCT 28.3 (L) 03/24/2023 1252   PLT 114 (L) 03/24/2023 1252   MCV 103.3 (H) 03/24/2023 1252   NEUTROABS 2.6 03/24/2023 1252   LYMPHSABS 0.8 03/24/2023 1252   MONOABS 0.3 03/24/2023 1252   EOSABS 0.0 03/24/2023 1252    BASOSABS 0.0 03/24/2023 1252   Comprehensive Metabolic Panel:    Component Value Date/Time   NA 136 03/24/2023 1252   NA 142 08/17/2015 0928   K 4.1 03/24/2023 1252   CL 109 03/24/2023 1252   CO2 19 (L) 03/24/2023 1252   BUN 18 03/24/2023 1252   BUN 17 08/17/2015 0928   CREATININE 0.96 03/24/2023 1252   CREATININE 1.06 09/30/2021 1119   GLUCOSE 124 (H) 03/24/2023 1252   CALCIUM 8.5 (L) 03/24/2023 1252   AST 25 03/24/2023 1252   ALT 9 03/24/2023 1252   ALKPHOS 922 (H) 03/24/2023 1252   BILITOT 0.5 03/24/2023 1252   PROT 6.7 03/24/2023 1252   PROT 6.8 08/17/2015 0928   ALBUMIN 3.2 (L) 03/24/2023 1252   ALBUMIN 4.1 08/17/2015 0928    RADIOGRAPHIC STUDIES: NM PLUVICTO ADMINISTRATION  Result Date: 03/23/2023 CLINICAL DATA:  82 year old male with metastatic prostate carcinoma. Extensive PSMA positive metastatic skeletal disease. EXAM: NUCLEAR MEDICINE PLUVICTO INJECTION TECHNIQUE: Infusion: The nuclear medicine technologist and I personally verified the dose activity to be delivered as specified in the written directive, and verified the patient identification via 2 separate methods. Initial flush of the intravenous catheter was performed was sterile saline. The dose syringe was connected to the catheter and the Lu-177 Pluvicto administered over a 1 to 10 min infusion. Single 10 cc lushes with normal saline follow the dose. No complications were noted. The entire IV tubing, venocatheter, stopcock and syringes was removed in total, placed in a disposal bag and sent for assay of the residual activity, which will be reported at a later time in our EMR by the physics staff. Pressure was applied to the venipuncture site, and a compression bandage placed. Patient monitored for 1 hour following infusion. Radiation Safety personnel were present to perform the discharge survey, as detailed on their documentation. After a short period of observation, the patient had his IV removed. RADIOPHARMACEUTICALS:   161.6 mCi Lu-177 PLUVICTO FINDINGS: Current Infusion: 4 Planned Infusions: 6 Patient presented to nuclear medicine for treatment. The patient's most recent blood counts were reviewed and remains a adequate candidate to proceed with Lu-177 Pluvicto. Patient reports no adverse effects. Patient does report hesitancy initiating urination in the morning. PSA is increased to 1,500 on 03/23/2023 compared to 1,085 on 02/22/2023 and 1027 on 01/09/2023 Patient remains anemic with hemoglobin equal 9.3 comparable to 8.71 month prior. Patient recent received transfusion in the interval. No change in 20% reduction in dose. Platelets decrease at  123 compared to 165 one  month prior. Renal function remains normal. The patient was situated in an infusion suite with a contact barrier placed under the arm. Intravenous access was established, using sterile technique, and a normal saline infusion from a syringe was started. Micro-dosimetry: The prescribed radiation activity was assayed  and confirmed to be within specified tolerance. IMPRESSION: Current Infusion: 4 Planned Infusions: 6 The patient tolerated the infusion well. The patient will return in 6 weeks month for ongoing care. Electronically Signed   By: Genevive Bi M.D.   On: 03/23/2023 14:34    PERFORMANCE STATUS (ECOG) : 1 - Symptomatic but completely ambulatory  Review of Systems Unless otherwise noted, a complete review of systems is negative.  Physical Exam General: NAD Pulmonary: Unlabored Extremities: no edema, no joint deformities Skin: no rashes Neurological: Weakness but otherwise nonfocal  IMPRESSION: Follow-up visit.  Patient accompanied by his granddaughter.  Patient reports he is doing well.  He denies any significant changes or concerns.  No new symptomatic complaints.  Granddaughter agrees with patient's assessment.  Oral intake is reportedly poor.  Patient started olanzapine today by Dr. [name].  Will send referral to  nutrition.  PLAN: -Continue current scope of treatment -ACP/MOST form previously reviewed -Referral to nutrition -Follow-up telephone visit 1 month   Patient expressed understanding and was in agreement with this plan. He also understands that He can call the clinic at any time with any questions, concerns, or complaints.     Time Total: 10 minutes  Visit consisted of counseling and education dealing with the complex and emotionally intense issues of symptom management and palliative care in the setting of serious and potentially life-threatening illness.Greater than 50%  of this time was spent counseling and coordinating care related to the above assessment and plan.  Signed by: Laurette Schimke, PhD, NP-C

## 2023-03-24 NOTE — Assessment & Plan Note (Addendum)
#  Stage IV-castrate resistant prostate cancer [OCT 2023] with multiple bone metastases/extensive lymphadenopathy; Currently s/p cycle # 6 of Taxotere [60 mg per metered square]- finished JULY, 2023. NOV 2023-PET scan shows multiple progressive bone lesions; shows no focal activity in the prostate/abdominal lymph nodes.   # Currently on  ADT-Eligard + Pluvicto q 6 W- with Dr.Stewart.  Currently s/p #4 on 5/30- PSA  trending up.  Otherwise patient tolerating fairly well.    # proceed with #5 as planned July 11th-however discontinued PSA elevation noted; discussed that rising PSA would suggest progressive disease, and alternative treatments would be needed to chemotherapy.   # Weight loss/ dyspnea/fatigue-likely secondary to progression of disease. Resume Zyprexa at nighttime.    # PN- from taxotere- G-2-3; Right UE weakness- S/p evaluation to Dr. Cannon Kettle post MRI of the cervical spine. Stable.   # Hematology: Anemia/thrombocytopenia-  secondary to underlying prostate cancer/chronic disease.  FEB 2024- No Iron def;  Prn PRBC transfusion.  Hemoglobin  8.9 today- HOLD blood. Stable.   # Prostatism symptoms: refilled- Flomax;Stable.   # Diffuse bone metastases/castrate resistant prostate cancer -currently on Zometa every 2 months or so.  Continue  ca+Vit D BID. JAN 2024- vit D 42. Corrected calcium is 9.3.- stable.   # IV access: functional refilled-port flush every 2 to 3 months.  emla cream.   # Vaccination: s/p flu shot; reocmmend COVID; RSV   # eligard q 46M-last jan 17th, 2024 ; [prefers 1-2 pm]; zometa on 11/09/2022  # DISPOSITION:   # cancel Blood tomorrow;  # HOLD zometa today # follow up 1 months- APP- labs-HOLD tube/port- cbc/cmp; PSA; possible zometa; Eligard- D-2 possible 1 unit PRBC-  Dr.B

## 2023-03-24 NOTE — Progress Notes (Signed)
Has pluvicto treatment yesterday. Dyspnea with walking. Productive sputum.Denies pain. No appetite. Not eating. Drinks 1 ensure per day, drinks a lot of water.Trouble peeing and has constipation. Taking metamucil for constipation. Numbness ain toes and heel. Wants Dr B to give him something to help him to pee.

## 2023-03-24 NOTE — Progress Notes (Signed)
[facility] CONSULT NOTE  Patient Care Team: Alba Cory, MD as PCP - General (Family Medicine) Earna Coder, MD as Consulting Physician (Oncology) Riki Altes, MD (Urology)  CHIEF COMPLAINTS/PURPOSE OF CONSULTATION: prostate cancer  Oncology History Overview Note  compatible with widespread metastatic prostate cancer, including innumerable sclerotic osseous lesions and extensive lymphadenopathy in the pelvis and retroperitoneum, as detailed above. 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 2. There is also an indeterminate hypovascular lesion in segment 5 of the liver which may represent a metastatic lesion. This could be better characterized with follow-up abdominal MRI with and without IV gadolinium if clinically appropriate. 3. Aortic atherosclerosis with mild fusiform aneurysmal dilatation of the infrarenal abdominal aorta measuring up to 3.2 x 3.0 cm. Recommend follow-up ultrasound every 3 years. This recommendation follows ACR consensus guidelines: White Paper of the ACR Incidental Findings Committee II on Vascular Findings. J Am Coll Radiol 2013; 10:789-794. 4. Bladder wall appears mildly thickened, likely related to bladder outlet obstruction given the enlarged prostate gland. 5. Severe colonic diverticulosis without evidence of acute diverticulitis at this time.  IMPRESSION: There are numerous foci of abnormal tracer uptake in the axial and proximal appendicular skeleton consistent with extensive skeletal metastatic disease.     #Stage IV castrate sensitive prostate cancer-December/20 2023-MRI negative for any liver lesions.; FEB 24th, 2022-Firmagon loading dose;  # march 24th-Taxoeter with udenyca; 4/12- add darolutamide  # DEC 2022- 374 [[place]; Urology]   Prostate cancer (HCC)  12/13/2021 Initial Diagnosis   Prostate  cancer (HCC)   12/13/2021 Cancer Staging   Staging form: Prostate, AJCC 8th Edition - Clinical: Stage IVB (cT2c, cN1, cM1, PSA: 374, Grade Group: 5) - Signed by Earna Coder, MD on 12/13/2021 Prostate specific antigen (PSA) range: 20 or greater Histologic grading system: 5 grade system   01/12/2022 - 04/28/2022 Chemotherapy   Patient is on Treatment Plan : PROSTATE Docetaxel + Prednisone q21d      Genetic Testing   Negative genetic testing. No pathogenic variants identified on the [facility] Common Hereditary Cancers +RNA panel. VUS in BRIP1 called c.485G>T identified. The report date is 03/04/2022.  The Common Hereditary Cancers Panel + RNA offered by [facility] includes sequencing and/or deletion duplication testing of the following 47 genes: APC, ATM, AXIN2, BARD1, BMPR1A, BRCA1, BRCA2, BRIP1, CDH1, CDKN2A (p14ARF), CDKN2A (p16INK4a), CKD4, CHEK2, CTNNA1, DICER1, EPCAM (Deletion/duplication testing only), GREM1 (promoter region deletion/duplication testing only), KIT, MEN1, MLH1, MSH2, MSH3, MSH6, MUTYH, NBN, NF1, NHTL1, PALB2, PDGFRA, PMS2, POLD1, POLE, PTEN, RAD50, RAD51C, RAD51D, SDHB, SDHC, SDHD, SMAD4, SMARCA4. STK11, TP53, TSC1, TSC2, and VHL.  The following genes were evaluated for sequence changes only: SDHA and HOXB13 c.251G>A variant only.    HISTORY OF PRESENTING ILLNESS: Patient ambulating with a  cane.   Accompanied by grand-daughter.   Eric [name] 82 y.o.  male pleasant patient above history of castrate resistant metastatic prostate cancer currently currently Eligard plus pluvicto is here for follow-up.    Patient currently s/p cycle # 4 on may 30th.   Dyspnea with walking.   Denies pain. No appetite. Not eating. Drinks 1 ensure per day, drinks a lot of water.  Trouble peeing and has constipation. Taking metamucil for constipation.   Numbness ain toes and heel.   Appetite is low; off  Zyprexa 5 mg.  No nausea no vomiting.  No sores in the mouth.  Denies any blood  in  stools or black or stools.  Review of Systems  Constitutional:  Positive for malaise/fatigue and weight loss. Negative for chills, diaphoresis and fever.  HENT:  Negative for nosebleeds and sore throat.   Eyes:  Negative for double vision.  Respiratory:  Positive for shortness of breath. Negative for cough, hemoptysis, sputum production and wheezing.   Cardiovascular:  Negative for chest pain, palpitations, orthopnea and leg swelling.  Gastrointestinal:  Negative for abdominal pain, blood in stool, constipation, diarrhea, heartburn, melena, nausea and vomiting.  Genitourinary:  Negative for dysuria, frequency and urgency.  Musculoskeletal:  Positive for back pain and joint pain.  Skin: Negative.  Negative for itching and rash.  Neurological:  Negative for dizziness, tingling, focal weakness, weakness and headaches.  Endo/Heme/Allergies:  Does not bruise/bleed easily.  Psychiatric/Behavioral:  Negative for depression. The patient is not nervous/anxious and does not have insomnia.     MEDICAL HISTORY:  Past Medical History:  Diagnosis Date   Cancer (HCC)    COPD (chronic obstructive pulmonary disease) (HCC)    Dyspnea    with exertion   Family history of cancer    GERD (gastroesophageal reflux disease)    occ   Hyperlipidemia    Hypertension     SURGICAL HISTORY: Past Surgical History:  Procedure Laterality Date   COLONOSCOPY     HEMORROIDECTOMY     IR IMAGING GUIDED PORT INSERTION  12/20/2021   PROSTATE BIOPSY N/A 10/26/2021   Procedure: PROSTATE BIOPSY;  Surgeon: Riki Altes, MD;  Location: [facility] ORS;  Service: Urology;  Laterality: N/A;   TRANSRECTAL ULTRASOUND N/A 10/26/2021   Procedure: TRANSRECTAL ULTRASOUND;  Surgeon: Riki Altes, MD;  Location: [facility] ORS;  Service: Urology;  Laterality: N/A;    SOCIAL HISTORY: Social History   Socioeconomic History   Marital status: Single    Spouse name: Not on file   Number of children: 1   Years of education: Not on file    Highest education level: High school graduate  Occupational History   Occupation: retired   Tobacco Use   Smoking status: Every Day    Packs/day: 0.50    Years: 60.00    Additional pack years: 0.00    Total pack years: 30.00    Types: Cigarettes   Smokeless tobacco: Never   Tobacco comments:    smoking cessation information provided  Vaping Use   Vaping Use: Never used  Substance and Sexual Activity   Alcohol use: Not Currently   Drug use: Not Currently   Sexual activity: Not Currently  Other Topics Concern   Not on file  Social History Narrative   Single, lives with male friend Rock Nephew is care giver] for the past 20 plus years. 1/2 ppd smoke; no alcohol. 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 Daughter lives 10-15 mins; in [place].    Social Determinants of Health   Financial Resource Strain: Low Risk  (02/10/2022)   Overall Financial Resource Strain (CARDIA)    Difficulty of Paying Living Expenses: Not hard at all  Food Insecurity: No Food Insecurity (02/10/2022)   Hunger Vital Sign    Worried About Running Out of Food in the Last Year: Never true    Ran Out of Food in the Last Year: Never true  Transportation Needs: No Transportation Needs (02/10/2022)   PRAPARE - Administrator, Civil Service (Medical): No    Lack of Transportation (Non-Medical): No  Physical Activity: Inactive (02/10/2022)   Exercise Vital  Sign    Days of Exercise per Week: 0 days    Minutes of Exercise per Session: 0 min  Stress: No Stress Concern Present (02/10/2022)   [employer] of Occupational Health - Occupational Stress Questionnaire    Feeling of Stress : Only a little  Social Connections: Socially Isolated (02/10/2022)   Social Connection and Isolation Panel [NHANES]    Frequency of Communication with Friends and Family: More than three times a week    Frequency of Social Gatherings with Friends and Family: Three times a week    Attends Religious Services:  Never    Active Member of Clubs or Organizations: No    Attends Banker Meetings: Never    Marital Status: Widowed  Intimate Partner Violence: Not At Risk (02/10/2022)   Humiliation, Afraid, Rape, and Kick questionnaire    Fear of Current or Ex-Partner: No    Emotionally Abused: No    Physically Abused: No    Sexually Abused: No    FAMILY HISTORY: Family History  Problem Relation Age of Onset   Heart disease Mother    COPD Father    Cancer Brother 75       blood cancer; unk exact type   Cancer Maternal Aunt        unk type   Cancer Paternal Aunt        unk type   Cancer Paternal Uncle        one unk type; one had throat   Cancer Cousin        unk types    ALLERGIES:  has No Known Allergies.  MEDICATIONS:  Current Outpatient Medications  Medication Sig Dispense Refill   acetaminophen (TYLENOL) 500 MG tablet Take 1 tablet (500 mg total) by mouth every 6 (six) hours as needed. 30 tablet 0   albuterol (VENTOLIN HFA) 108 (90 Base) MCG/ACT inhaler TAKE 2 PUFFS BY MOUTH EVERY 6 HOURS AS NEEDED FOR WHEEZE OR SHORTNESS OF BREATH 18 each 5   atorvastatin (LIPITOR) 40 MG tablet TAKE 1 TABLET (40 MG TOTAL) BY MOUTH DAILY. IN PLACE OF ROSUVASTATIN DUE TO CANER TREATMENT 90 tablet 1   Ca Carbonate-Mag Hydroxide (ROLAIDS PO) Take 1 tablet by mouth as needed.     ezetimibe (ZETIA) 10 MG tablet Take 1 tablet (10 mg total) by mouth every morning. 90 tablet 1   lidocaine-prilocaine (EMLA) cream Apply on the port. 30 -45 min  prior to port access. 30 g 3   lisinopril (ZESTRIL) 2.5 MG tablet Take by mouth.     metoprolol succinate (TOPROL-XL) 25 MG 24 hr tablet Take 1 tablet (25 mg total) by mouth daily. Take with or immediately following a meal. 90 tablet 1   Fluticasone-Umeclidin-Vilant (TRELEGY ELLIPTA) 100-62.5-25 MCG/ACT AEPB Inhale 1 puff into the lungs daily. 3 each 1   gabapentin (NEURONTIN) 100 MG capsule Take 1 pill at nighttime for 1 week.  If tolerating well take 2 pills  at night. (Patient not taking: Reported on 08/10/2022) 60 capsule 0   OLANZapine (ZYPREXA) 5 MG tablet TAKE 1 TABLET BY MOUTH EVERYDAY AT BEDTIME 90 tablet 2   ondansetron (ZOFRAN) 8 MG tablet One pill every 8 hours as needed for nausea/vomitting. (Patient not taking: Reported on 03/24/2023) 40 tablet 1   prochlorperazine (COMPAZINE) 10 MG tablet Take 1 tablet (10 mg total) by mouth every 6 (six) hours as needed for nausea or vomiting. 40 tablet 1   tamsulosin (FLOMAX) 0.4 MG CAPS capsule Take 1 capsule (  0.4 mg total) by mouth daily after supper. 90 capsule 1   No current facility-administered medications for this visit.   Facility-Administered Medications Ordered in Other Visits  Medication Dose Route Frequency Provider Last Rate Last Admin   sodium chloride 0.9 % bolus 1,000 mL  1,000 mL Intravenous Once Amil Amen, J, MD        PHYSICAL EXAMINATION:   Vitals:   03/24/23 1312  BP: (!) 114/58  Pulse: 100  Resp: (!) 21  Temp: (!) 96.8 F (36 C)  SpO2: 100%   Filed Weights   03/24/23 1312  Weight: 134 lb 6.4 oz (61 kg)    Physical Exam Vitals and nursing note reviewed.  HENT:     Head: Normocephalic and atraumatic.     Mouth/Throat:     Pharynx: Oropharynx is clear.  Eyes:     Extraocular Movements: Extraocular movements intact.     Pupils: Pupils are equal, round, and reactive to light.  Cardiovascular:     Rate and Rhythm: Normal rate and regular rhythm.  Pulmonary:     Comments: Decreased breath sounds bilaterally.  Abdominal:     Palpations: Abdomen is soft.  Musculoskeletal:        General: Normal range of motion.     Cervical back: Normal range of motion.  Skin:    General: Skin is warm.  Neurological:     General: No focal deficit present.     Mental Status: He is alert and oriented to person, place, and time.  Psychiatric:        Behavior: Behavior normal.        Judgment: Judgment normal.     LABORATORY DATA:  I have reviewed the data as listed Lab  Results  Component Value Date   WBC 3.8 (L) 03/24/2023   HGB 8.9 (L) 03/24/2023   HCT 28.3 (L) 03/24/2023   MCV 103.3 (H) 03/24/2023   PLT 114 (L) 03/24/2023   Recent Labs    02/22/23 1319 03/13/23 1334 03/24/23 1252  NA 134* 136 136  K 4.1 4.3 4.1  CL 106 107 109  CO2 18* 19* 19*  GLUCOSE 122* 98 124*  BUN 12 18 18   CREATININE 0.86 0.99 0.96  CALCIUM 8.1* 8.4* 8.5*  GFRNONAA >60 >60 >60  PROT 6.0* 6.3* 6.7  ALBUMIN 3.2* 3.5 3.2*  AST 22 26 25   ALT 8 8 9   ALKPHOS 970* 1,496* 922*  BILITOT 0.3 0.4 0.5    RADIOGRAPHIC STUDIES: I have personally reviewed the radiological images as listed and agreed with the findings in the report. NM PLUVICTO ADMINISTRATION  Result Date: 03/23/2023 CLINICAL DATA:  82 year old male with metastatic prostate carcinoma. Extensive PSMA positive metastatic skeletal disease. EXAM: NUCLEAR MEDICINE PLUVICTO INJECTION TECHNIQUE: Infusion: The nuclear medicine technologist and I personally verified the dose activity to be delivered as specified in the written directive, and verified the patient identification via 2 separate methods. Initial flush of the intravenous catheter was performed was sterile saline. The dose syringe was connected to the catheter and the Lu-177 Pluvicto administered over a 1 to 10 min infusion. Single 10 cc lushes with normal saline follow the dose. No complications were noted. The entire IV tubing, venocatheter, stopcock and syringes was removed in total, placed in a disposal bag and sent for assay of the residual activity, which will be reported at a later time in our EMR by the physics staff. Pressure was applied to the venipuncture site, and a compression bandage placed. Patient monitored  for 1 hour following infusion. Radiation Safety personnel were present to perform the discharge survey, as detailed on their documentation. After a short period of observation, the patient had his IV removed. RADIOPHARMACEUTICALS:  161.6 mCi Lu-177  PLUVICTO FINDINGS: Current Infusion: 4 Planned Infusions: 6 Patient presented to nuclear medicine for treatment. The patient's most recent blood counts were reviewed and remains a adequate candidate to proceed with Lu-177 Pluvicto. Patient reports no adverse effects. Patient does report hesitancy initiating urination in the morning. PSA is increased to 1,500 on 03/23/2023 compared to 1,085 on 02/22/2023 and 1027 on 01/09/2023 Patient remains anemic with hemoglobin equal 9.3 comparable to 8.71 month prior. Patient recent received transfusion in the interval. No change in 20% reduction in dose. Platelets decrease at  123 compared to 165 one  month prior. Renal function remains normal. The patient was situated in an infusion suite with a contact barrier placed under the arm. Intravenous access was established, using sterile technique, and a normal saline infusion from a syringe was started. Micro-dosimetry: The prescribed radiation activity was assayed and confirmed to be within specified tolerance. IMPRESSION: Current Infusion: 4 Planned Infusions: 6 The patient tolerated the infusion well. The patient will return in 6 weeks month for ongoing care. Electronically Signed   By: Genevive Bi M.D.   On: 03/23/2023 14:34     Prostate cancer (HCC) #Stage IV-castrate resistant prostate cancer [OCT 2023] with multiple bone metastases/extensive lymphadenopathy; Currently s/p cycle # 6 of Taxotere [60 mg per metered square]- finished JULY, 2023. NOV 2023-PET scan shows multiple progressive bone lesions; shows no focal activity in the prostate/abdominal lymph nodes.   # Currently on  ADT-Eligard + Pluvicto q 6 W- with Dr.Stewart.  Currently s/p #4 on 5/30- PSA  trending up.  Otherwise patient tolerating fairly well.    # proceed with #5 as planned July 11th-however discontinued PSA elevation noted; discussed that rising PSA would suggest progressive disease, and alternative treatments would be needed to chemotherapy.    # Weight loss/ dyspnea/fatigue-likely secondary to progression of disease. Resume Zyprexa at nighttime.    # PN- from taxotere- G-2-3; Right UE weakness- S/p evaluation to Dr. Cannon Kettle post MRI of the cervical spine. Stable.   # Hematology: Anemia/thrombocytopenia-  secondary to underlying prostate cancer/chronic disease.  FEB 2024- No Iron def;  Prn PRBC transfusion.  Hemoglobin  8.9 today- HOLD blood. Stable.   # Prostatism symptoms: refilled- Flomax;Stable.   # Diffuse bone metastases/castrate resistant prostate cancer -currently on Zometa every 2 months or so.  Continue  ca+Vit D BID. JAN 2024- vit D 42. Corrected calcium is 9.3.- stable.   # IV access: functional refilled-port flush every 2 to 3 months.  emla cream.   # Vaccination: s/p flu shot; reocmmend COVID; RSV   # eligard q 88M-last jan 17th, 2024 ; [prefers 1-2 pm]; zometa on 11/09/2022  # DISPOSITION:   # cancel Blood tomorrow;  # HOLD zometa today # follow up 1 months- APP- labs-HOLD tube/port- cbc/cmp; PSA; possible zometa; Eligard- D-2 possible 1 unit PRBC-  Dr.B      Earna Coder, MD 03/24/2023 1:39 PM

## 2023-03-24 NOTE — Addendum Note (Signed)
Addended by: Darrold Span A on: 03/24/2023 01:56 PM   Modules accepted: Orders

## 2023-03-27 ENCOUNTER — Inpatient Hospital Stay: Payer: Medicare HMO

## 2023-03-28 ENCOUNTER — Encounter: Payer: Self-pay | Admitting: Internal Medicine

## 2023-03-28 NOTE — Progress Notes (Signed)
I tried to reach the patient's granddaughter.  Unable to reach left a voicemail regarding rising PSA.  And the plan to proceed with radiation IV treatment in [place] as planned in  July based upon blood work.  Gb

## 2023-04-04 ENCOUNTER — Encounter: Payer: Self-pay | Admitting: Internal Medicine

## 2023-04-04 NOTE — Progress Notes (Signed)
I spoke to patient's granddaughter regarding the rising PSA and the difficult situation given limited treatment options for his prostate cancer. Patient of borderline candidate for subsequent chemotherapy.    Await blood work in anticipation of #6 treatment of radiation treatment.  Will discuss further at next visit.

## 2023-04-13 ENCOUNTER — Inpatient Hospital Stay: Payer: Medicare HMO | Attending: Internal Medicine | Admitting: Hospice and Palliative Medicine

## 2023-04-13 DIAGNOSIS — C61 Malignant neoplasm of prostate: Secondary | ICD-10-CM | POA: Diagnosis not present

## 2023-04-13 DIAGNOSIS — C7951 Secondary malignant neoplasm of bone: Secondary | ICD-10-CM | POA: Insufficient documentation

## 2023-04-13 DIAGNOSIS — R748 Abnormal levels of other serum enzymes: Secondary | ICD-10-CM | POA: Insufficient documentation

## 2023-04-13 DIAGNOSIS — Z515 Encounter for palliative care: Secondary | ICD-10-CM | POA: Diagnosis not present

## 2023-04-13 DIAGNOSIS — Z5111 Encounter for antineoplastic chemotherapy: Secondary | ICD-10-CM | POA: Insufficient documentation

## 2023-04-13 DIAGNOSIS — F17210 Nicotine dependence, cigarettes, uncomplicated: Secondary | ICD-10-CM | POA: Insufficient documentation

## 2023-04-13 DIAGNOSIS — D696 Thrombocytopenia, unspecified: Secondary | ICD-10-CM | POA: Insufficient documentation

## 2023-04-13 DIAGNOSIS — R5383 Other fatigue: Secondary | ICD-10-CM | POA: Insufficient documentation

## 2023-04-13 DIAGNOSIS — D649 Anemia, unspecified: Secondary | ICD-10-CM | POA: Insufficient documentation

## 2023-04-13 DIAGNOSIS — R634 Abnormal weight loss: Secondary | ICD-10-CM | POA: Insufficient documentation

## 2023-04-13 NOTE — Progress Notes (Signed)
Virtual Visit via Telephone Note  I connected with Eric [name] on 04/13/23 at  3:20 PM EDT by telephone and verified that I am speaking with the correct person using two identifiers.  Location: Patient: Home Provider: Clinic   I discussed the limitations, risks, security and privacy concerns of performing an evaluation and management service by telephone and the availability of in person appointments. I also discussed with the patient that there may be a patient responsible charge related to this service. The patient expressed understanding and agreed to proceed.   History of Present Illness: Eric [name] is a 82 y.o. 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  Patient currently on treatment with ADT/Eligard/Pluvicto.  He was referred to palliative care to address goals and manage ongoing symptoms.    Observations/Objective: I called and spoke with patient by phone.  He says he is doing well.  He denies any significant changes or concerns since last seen.  No acute symptomatic complaints today.  Patient denies pain or other distressing symptoms.  He says his appetite improved after starting olanzapine.  He feels like he is tolerating treatment so far.  Assessment and Plan: Stage IV prostate cancer -continue current scope of treatment.  Patient denies symptomatic complaints today.  Will follow.  Follow Up Instructions: Follow-up telephone visit 1 month   I discussed the assessment and treatment plan with the patient. The patient was provided an opportunity to ask questions and all were answered. The patient agreed with the plan and demonstrated an understanding of the instructions.   The patient was advised to call back or seek an in-person evaluation if the symptoms worsen or if the condition fails to improve as anticipated.  I provided 5 minutes of non-face-to-face time  during this encounter.   Malachy Moan, NP

## 2023-04-21 ENCOUNTER — Other Ambulatory Visit: Payer: Self-pay | Admitting: Internal Medicine

## 2023-04-21 ENCOUNTER — Inpatient Hospital Stay (HOSPITAL_BASED_OUTPATIENT_CLINIC_OR_DEPARTMENT_OTHER): Payer: Medicare HMO | Admitting: Medical Oncology

## 2023-04-21 ENCOUNTER — Encounter: Payer: Self-pay | Admitting: Medical Oncology

## 2023-04-21 ENCOUNTER — Inpatient Hospital Stay: Payer: Medicare HMO

## 2023-04-21 VITALS — BP 116/59 | HR 93 | Temp 96.4°F | Resp 18 | Ht 66.0 in | Wt 134.0 lb

## 2023-04-21 VITALS — BP 142/58 | HR 87 | Temp 96.7°F | Resp 18

## 2023-04-21 DIAGNOSIS — D6959 Other secondary thrombocytopenia: Secondary | ICD-10-CM | POA: Diagnosis not present

## 2023-04-21 DIAGNOSIS — C7951 Secondary malignant neoplasm of bone: Secondary | ICD-10-CM

## 2023-04-21 DIAGNOSIS — F17210 Nicotine dependence, cigarettes, uncomplicated: Secondary | ICD-10-CM

## 2023-04-21 DIAGNOSIS — Z95828 Presence of other vascular implants and grafts: Secondary | ICD-10-CM

## 2023-04-21 DIAGNOSIS — G620 Drug-induced polyneuropathy: Secondary | ICD-10-CM

## 2023-04-21 DIAGNOSIS — C61 Malignant neoplasm of prostate: Secondary | ICD-10-CM

## 2023-04-21 DIAGNOSIS — R634 Abnormal weight loss: Secondary | ICD-10-CM

## 2023-04-21 DIAGNOSIS — Z5111 Encounter for antineoplastic chemotherapy: Secondary | ICD-10-CM | POA: Diagnosis present

## 2023-04-21 DIAGNOSIS — R748 Abnormal levels of other serum enzymes: Secondary | ICD-10-CM

## 2023-04-21 DIAGNOSIS — R5383 Other fatigue: Secondary | ICD-10-CM | POA: Diagnosis not present

## 2023-04-21 DIAGNOSIS — D6481 Anemia due to antineoplastic chemotherapy: Secondary | ICD-10-CM | POA: Diagnosis not present

## 2023-04-21 DIAGNOSIS — D649 Anemia, unspecified: Secondary | ICD-10-CM | POA: Diagnosis not present

## 2023-04-21 DIAGNOSIS — D696 Thrombocytopenia, unspecified: Secondary | ICD-10-CM | POA: Diagnosis not present

## 2023-04-21 LAB — CBC WITH DIFFERENTIAL (CANCER CENTER ONLY)
Abs Immature Granulocytes: 0.02 10*3/uL (ref 0.00–0.07)
Basophils Absolute: 0 10*3/uL (ref 0.0–0.1)
Basophils Relative: 0 %
Eosinophils Absolute: 0 10*3/uL (ref 0.0–0.5)
Eosinophils Relative: 1 %
HCT: 21.8 % — ABNORMAL LOW (ref 39.0–52.0)
Hemoglobin: 6.9 g/dL — CL (ref 13.0–17.0)
Immature Granulocytes: 1 %
Lymphocytes Relative: 27 %
Lymphs Abs: 1.2 10*3/uL (ref 0.7–4.0)
MCH: 33.7 pg (ref 26.0–34.0)
MCHC: 31.7 g/dL (ref 30.0–36.0)
MCV: 106.3 fL — ABNORMAL HIGH (ref 80.0–100.0)
Monocytes Absolute: 0.4 10*3/uL (ref 0.1–1.0)
Monocytes Relative: 8 %
Neutro Abs: 2.7 10*3/uL (ref 1.7–7.7)
Neutrophils Relative %: 63 %
Platelet Count: 64 10*3/uL — ABNORMAL LOW (ref 150–400)
RBC: 2.05 MIL/uL — ABNORMAL LOW (ref 4.22–5.81)
RDW: 21 % — ABNORMAL HIGH (ref 11.5–15.5)
WBC Count: 4.2 10*3/uL (ref 4.0–10.5)
nRBC: 0 % (ref 0.0–0.2)

## 2023-04-21 LAB — CMP (CANCER CENTER ONLY)
ALT: 7 U/L (ref 0–44)
AST: 19 U/L (ref 15–41)
Albumin: 3.1 g/dL — ABNORMAL LOW (ref 3.5–5.0)
Alkaline Phosphatase: 1159 U/L — ABNORMAL HIGH (ref 38–126)
Anion gap: 9 (ref 5–15)
BUN: 10 mg/dL (ref 8–23)
CO2: 19 mmol/L — ABNORMAL LOW (ref 22–32)
Calcium: 8.3 mg/dL — ABNORMAL LOW (ref 8.9–10.3)
Chloride: 113 mmol/L — ABNORMAL HIGH (ref 98–111)
Creatinine: 0.79 mg/dL (ref 0.61–1.24)
GFR, Estimated: >60 mL/min (ref >60)
Glucose, Bld: 115 mg/dL — ABNORMAL HIGH (ref 70–99)
Potassium: 3.7 mmol/L (ref 3.5–5.1)
Sodium: 141 mmol/L (ref 135–145)
Total Bilirubin: 0.5 mg/dL (ref 0.3–1.2)
Total Protein: 5.6 g/dL — ABNORMAL LOW (ref 6.5–8.1)

## 2023-04-21 LAB — TYPE AND SCREEN
Antibody Screen: NEGATIVE
Unit division: 0

## 2023-04-21 LAB — BPAM RBC

## 2023-04-21 LAB — PREPARE RBC (CROSSMATCH)

## 2023-04-21 LAB — PSA: Prostatic Specific Antigen: >1500 ng/mL — ABNORMAL HIGH (ref 0.00–4.00)

## 2023-04-21 MED ORDER — ZOLEDRONIC ACID 4 MG/100ML IV SOLN
4.0000 mg | Freq: Once | INTRAVENOUS | Status: AC
  Administered 2023-04-21: 4 mg via INTRAVENOUS
  Filled 2023-04-21: qty 100

## 2023-04-21 MED ORDER — LEUPROLIDE ACETATE (6 MONTH) 45 MG ~~LOC~~ KIT
45.0000 mg | PACK | Freq: Once | SUBCUTANEOUS | Status: AC
  Administered 2023-04-21: 45 mg via SUBCUTANEOUS
  Filled 2023-04-21: qty 45

## 2023-04-21 MED ORDER — SODIUM CHLORIDE 0.9 % IV SOLN
Freq: Once | INTRAVENOUS | Status: AC
  Filled 2023-04-21: qty 250

## 2023-04-21 MED ORDER — HEPARIN SOD (PORK) LOCK FLUSH 100 UNIT/ML IV SOLN
500.0000 [IU] | Freq: Once | INTRAVENOUS | Status: AC | PRN
  Administered 2023-04-21: 500 [IU]
  Filled 2023-04-21: qty 5

## 2023-04-21 NOTE — Progress Notes (Signed)
[name], MD verified ok to give eligard today on 04/21/23. Last dose was 11/09/22.

## 2023-04-21 NOTE — Progress Notes (Signed)
[facility] CONSULT NOTE  Patient Care Team: Alba Cory, MD as PCP - General (Family Medicine) Earna Coder, MD as Consulting Physician (Oncology) Riki Altes, MD (Urology)  CHIEF COMPLAINTS/PURPOSE OF CONSULTATION: anemia.   Oncology History Overview Note  compatible with widespread metastatic prostate cancer, including innumerable sclerotic osseous lesions and extensive lymphadenopathy in the pelvis and retroperitoneum, as detailed above. 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 2. There is also an indeterminate hypovascular lesion in segment 5 of the liver which may represent a metastatic lesion. This could be better characterized with follow-up abdominal MRI with and without IV gadolinium if clinically appropriate. 3. Aortic atherosclerosis with mild fusiform aneurysmal dilatation of the infrarenal abdominal aorta measuring up to 3.2 x 3.0 cm. Recommend follow-up ultrasound every 3 years. This recommendation follows ACR consensus guidelines: White Paper of the ACR Incidental Findings Committee II on Vascular Findings. J Am Coll Radiol 2013; 10:789-794. 4. Bladder wall appears mildly thickened, likely related to bladder outlet obstruction given the enlarged prostate gland. 5. Severe colonic diverticulosis without evidence of acute diverticulitis at this time.  IMPRESSION: There are numerous foci of abnormal tracer uptake in the axial and proximal appendicular skeleton consistent with extensive skeletal metastatic disease.     #Stage IV castrate sensitive prostate cancer-December/20 2023-MRI negative for any liver lesions.; FEB 24th, 2022-Firmagon loading dose;  # march 24th-Taxoeter with udenyca; 4/12- add darolutamide  # DEC 2022- 374 [[facility]; Urology]   Prostate cancer (HCC)  12/13/2021 Initial Diagnosis   Prostate cancer  (HCC)   12/13/2021 Cancer Staging   Staging form: Prostate, AJCC 8th Edition - Clinical: Stage IVB (cT2c, cN1, cM1, PSA: 374, Grade Group: 5) - Signed by Earna Coder, MD on 12/13/2021 Prostate specific antigen (PSA) range: 20 or greater Histologic grading system: 5 grade system   01/12/2022 - 04/28/2022 Chemotherapy   Patient is on Treatment Plan : PROSTATE Docetaxel + Prednisone q21d      Genetic Testing   Negative genetic testing. No pathogenic variants identified on the [facility] Common Hereditary Cancers +RNA panel. VUS in BRIP1 called c.485G>T identified. The report date is 03/04/2022.  The Common Hereditary Cancers Panel + RNA offered by [facility] includes sequencing and/or deletion duplication testing of the following 47 genes: APC, ATM, AXIN2, BARD1, BMPR1A, BRCA1, BRCA2, BRIP1, CDH1, CDKN2A (p14ARF), CDKN2A (p16INK4a), CKD4, CHEK2, CTNNA1, DICER1, EPCAM (Deletion/duplication testing only), GREM1 (promoter region deletion/duplication testing only), KIT, MEN1, MLH1, MSH2, MSH3, MSH6, MUTYH, NBN, NF1, NHTL1, PALB2, PDGFRA, PMS2, POLD1, POLE, PTEN, RAD50, RAD51C, RAD51D, SDHB, SDHC, SDHD, SMAD4, SMARCA4. STK11, TP53, TSC1, TSC2, and VHL.  The following genes were evaluated for sequence changes only: SDHA and HOXB13 c.251G>A variant only.    HISTORY OF PRESENTING ILLNESS: Patient ambulating with a  cane.   Accompanied by grand-daughter.   BRACH PITZEN 82 y.o.  male pleasant patient above history of castrate resistant metastatic prostate cancer currently currently Eligard plus pluvicto is here for follow-up to see if he would benefit from a blood transfusion which is scheduled for Monday. He is also being considered for his Eliguard and Zometa.  He is here with his grandson.   Patient currently s/p cycle # 4 on may 30th 2024. Cycle 5 is planned for July 11. He now gets Eliguard and Zometa.   Today he states that he is feeling well. He is not having any pain, he is eating well  and feels  like he has good energy. He is ambulating with a Rolator. He denies any bleeding episodes, new fatigue, SOB, dizziness, chest pain.   Drinks 1 ensure per day  Wt Readings from Last 3 Encounters:  04/21/23 134 lb (60.8 kg)  03/24/23 134 lb 6.4 oz (61 kg)  03/13/23 134 lb 4.8 oz (60.9 kg)     Review of Systems  Constitutional:  Negative for chills, diaphoresis, fever, malaise/fatigue and weight loss.  HENT:  Negative for nosebleeds and sore throat.   Eyes:  Negative for double vision.  Respiratory:  Negative for cough, hemoptysis, sputum production, shortness of breath and wheezing.   Cardiovascular:  Negative for chest pain, palpitations, orthopnea and leg swelling.  Gastrointestinal:  Negative for abdominal pain, blood in stool, constipation, diarrhea, heartburn, melena, nausea and vomiting.  Genitourinary:  Negative for dysuria, frequency and urgency.  Musculoskeletal:  Negative for back pain and joint pain.  Skin: Negative.  Negative for itching and rash.  Neurological:  Negative for dizziness, tingling, focal weakness, weakness and headaches.  Endo/Heme/Allergies:  Does not bruise/bleed easily.  Psychiatric/Behavioral:  Negative for depression. The patient is not nervous/anxious and does not have insomnia.     MEDICAL HISTORY:  Past Medical History:  Diagnosis Date   Cancer (HCC)    COPD (chronic obstructive pulmonary disease) (HCC)    Dyspnea    with exertion   Family history of cancer    GERD (gastroesophageal reflux disease)    occ   Hyperlipidemia    Hypertension     SURGICAL HISTORY: Past Surgical History:  Procedure Laterality Date   COLONOSCOPY     HEMORROIDECTOMY     IR IMAGING GUIDED PORT INSERTION  12/20/2021   PROSTATE BIOPSY N/A 10/26/2021   Procedure: PROSTATE BIOPSY;  Surgeon: Riki Altes, MD;  Location: [facility] ORS;  Service: Urology;  Laterality: N/A;   TRANSRECTAL ULTRASOUND N/A 10/26/2021   Procedure: TRANSRECTAL ULTRASOUND;  Surgeon: Riki Altes, MD;  Location: [facility] ORS;  Service: Urology;  Laterality: N/A;    SOCIAL HISTORY: Social History   Socioeconomic History   Marital status: Single    Spouse name: Not on file   Number of children: 1   Years of education: Not on file   Highest education level: High school graduate  Occupational History   Occupation: retired   Tobacco Use   Smoking status: Every Day    Packs/day: 0.50    Years: 60.00    Additional pack years: 0.00    Total pack years: 30.00    Types: Cigarettes   Smokeless tobacco: Never   Tobacco comments:    smoking cessation information provided  Vaping Use   Vaping Use: Never used  Substance and Sexual Activity   Alcohol use: Not Currently   Drug use: Not Currently   Sexual activity: Not Currently  Other Topics Concern   Not on file  Social History Narrative   Single, lives with male friend Rock Nephew is care giver] for the past 20 plus years. 1/2 ppd smoke; no alcohol. 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 Daughter lives 10-15 mins; in [place].    Social Determinants of Health   Financial Resource Strain: Low Risk  (02/10/2022)   Overall Financial Resource Strain (CARDIA)    Difficulty of Paying Living Expenses: Not hard at all  Food Insecurity: No Food Insecurity (02/10/2022)   Hunger Vital Sign    Worried About Running Out of Food in the Last Year:  Never true    Ran Out of Food in the Last Year: Never true  Transportation Needs: No Transportation Needs (02/10/2022)   PRAPARE - Administrator, Civil Service (Medical): No    Lack of Transportation (Non-Medical): No  Physical Activity: Inactive (02/10/2022)   Exercise Vital Sign    Days of Exercise per Week: 0 days    Minutes of Exercise per Session: 0 min  Stress: No Stress Concern Present (02/10/2022)   [employer] of Occupational Health - Occupational Stress Questionnaire    Feeling of Stress : Only a little  Social Connections: Socially Isolated (02/10/2022)    Social Connection and Isolation Panel [NHANES]    Frequency of Communication with Friends and Family: More than three times a week    Frequency of Social Gatherings with Friends and Family: Three times a week    Attends Religious Services: Never    Active Member of Clubs or Organizations: No    Attends Banker Meetings: Never    Marital Status: Widowed  Intimate Partner Violence: Not At Risk (02/10/2022)   Humiliation, Afraid, Rape, and Kick questionnaire    Fear of Current or Ex-Partner: No    Emotionally Abused: No    Physically Abused: No    Sexually Abused: No    FAMILY HISTORY: Family History  Problem Relation Age of Onset   Heart disease Mother    COPD Father    Cancer Brother 55       blood cancer; unk exact type   Cancer Maternal Aunt        unk type   Cancer Paternal Aunt        unk type   Cancer Paternal Uncle        one unk type; one had throat   Cancer Cousin        unk types    ALLERGIES:  has No Known Allergies.  MEDICATIONS:  Current Outpatient Medications  Medication Sig Dispense Refill   acetaminophen (TYLENOL) 500 MG tablet Take 1 tablet (500 mg total) by mouth every 6 (six) hours as needed. 30 tablet 0   albuterol (VENTOLIN HFA) 108 (90 Base) MCG/ACT inhaler TAKE 2 PUFFS BY MOUTH EVERY 6 HOURS AS NEEDED FOR WHEEZE OR SHORTNESS OF BREATH 18 each 5   atorvastatin (LIPITOR) 40 MG tablet TAKE 1 TABLET (40 MG TOTAL) BY MOUTH DAILY. IN PLACE OF ROSUVASTATIN DUE TO CANER TREATMENT 90 tablet 1   Ca Carbonate-Mag Hydroxide (ROLAIDS PO) Take 1 tablet by mouth as needed.     ezetimibe (ZETIA) 10 MG tablet Take 1 tablet (10 mg total) by mouth every morning. 90 tablet 1   Fluticasone-Umeclidin-Vilant (TRELEGY ELLIPTA) 100-62.5-25 MCG/ACT AEPB Inhale 1 puff into the lungs daily. 3 each 1   lidocaine-prilocaine (EMLA) cream Apply on the port. 30 -45 min  prior to port access. 30 g 3   lisinopril (ZESTRIL) 2.5 MG tablet Take by mouth.     metoprolol  succinate (TOPROL-XL) 25 MG 24 hr tablet Take 1 tablet (25 mg total) by mouth daily. Take with or immediately following a meal. 90 tablet 1   OLANZapine (ZYPREXA) 5 MG tablet TAKE 1 TABLET BY MOUTH EVERYDAY AT BEDTIME 90 tablet 2   prochlorperazine (COMPAZINE) 10 MG tablet Take 1 tablet (10 mg total) by mouth every 6 (six) hours as needed for nausea or vomiting. 40 tablet 1   tamsulosin (FLOMAX) 0.4 MG CAPS capsule Take 1 capsule (0.4 mg total) by mouth daily  after supper. 90 capsule 1   gabapentin (NEURONTIN) 100 MG capsule Take 1 pill at nighttime for 1 week.  If tolerating well take 2 pills at night. (Patient not taking: Reported on 08/10/2022) 60 capsule 0   ondansetron (ZOFRAN) 8 MG tablet One pill every 8 hours as needed for nausea/vomitting. (Patient not taking: Reported on 03/24/2023) 40 tablet 1   No current facility-administered medications for this visit.   Vitals:   04/21/23 1315  BP: (!) 116/59  Pulse: 93  Resp: 18  Temp: (!) 96.4 F (35.8 C)  SpO2: 99%   Filed Weights   04/21/23 1315  Weight: 134 lb (60.8 kg)    Physical Exam Vitals and nursing note reviewed.  Constitutional:      Comments: Using a Rolator    HENT:     Head: Normocephalic and atraumatic.     Mouth/Throat:     Pharynx: Oropharynx is clear.  Eyes:     Extraocular Movements: Extraocular movements intact.     Pupils: Pupils are equal, round, and reactive to light.  Cardiovascular:     Rate and Rhythm: Normal rate and regular rhythm.  Pulmonary:     Comments: Decreased breath sounds bilaterally.  Abdominal:     Palpations: Abdomen is soft.  Musculoskeletal:        General: Normal range of motion.     Cervical back: Normal range of motion.  Skin:    General: Skin is warm.  Neurological:     General: No focal deficit present.     Mental Status: He is alert and oriented to person, place, and time.  Psychiatric:        Behavior: Behavior normal.        Judgment: Judgment normal.      LABORATORY DATA:  I have reviewed the data as listed Lab Results  Component Value Date   WBC 4.2 04/21/2023   HGB 6.9 (LL) 04/21/2023   HCT 21.8 (L) 04/21/2023   MCV 106.3 (H) 04/21/2023   PLT 64 (L) 04/21/2023   Recent Labs    03/13/23 1334 03/24/23 1252 04/21/23 1253  NA 136 136 141  K 4.3 4.1 3.7  CL 107 109 113*  CO2 19* 19* 19*  GLUCOSE 98 124* 115*  BUN 18 18 10   CREATININE 0.99 0.96 0.79  CALCIUM 8.4* 8.5* 8.3*  GFRNONAA >60 >60 >60  PROT 6.3* 6.7 5.6*  ALBUMIN 3.5 3.2* 3.1*  AST 26 25 19   ALT 8 9 7   ALKPHOS 1,496* 922* 1,159*  BILITOT 0.4 0.5 0.5   Prostate cancer (HCC) #Stage IV-castrate resistant prostate cancer [OCT 2023] with multiple bone metastases/extensive lymphadenopathy; Currently s/p cycle # 6 of Taxotere [60 mg per metered square]- finished JULY, 2023. NOV 2023-PET scan shows multiple progressive bone lesions; shows no focal activity in the prostate/abdominal lymph nodes.    # Currently on  ADT-Eligard + Pluvicto q 6 W- with Dr.Stewart.  Currently s/p #4 on 5/30- PSA  has trended up to >1,500.  Otherwise patient tolerating fairly well.   #5 as planned July 11th per Dr. Sharlette Dense last note. Today he will be receiving Eliguard and zometa per Dr. Vonzell Schlatter notes. Consulted again today.    # Weight loss/ dyspnea/fatigue-Chronic secondary to disease state. Doing well on the Zyprexa. Weight is now stable.  He will continue this medication at this time.    # PN- from taxotere- G-2-3; Right UE weakness- S/p evaluation to Dr. Cannon Kettle post MRI of the cervical spine. Stable.    #  Hematology: Anemia/thrombocytopenia-  secondary to underlying prostate cancer/chronic disease.  FEB 2024- No Iron def;  Prn PRBC transfusion.  Hemoglobin 6.9 today. Plan for blood transfusion on Monday. Discussed red flag signs and symptoms.    # Prostatism symptoms: Stable on Flomax   # Diffuse bone metastases/castrate resistant prostate cancer -currently on Zometa  every 2 months or so.  Continue  ca+Vit D BID. JAN 2024- vit D 42. Corrected calcium is 8.6.- Zometa today  # Elevated Alk Phos level: Waxes and wanes from 900s-1,496.    # IV access: functional refilled-port flush every 2 to 3 months.  Emla cream.   # eligard q 43M-last jan 17th, 2024 ; [prefers 1-2 pm] He is due for this today per Dr. Kizzie Bane; zometa on 11/09/2022   # DISPOSITION:   1 unit PRBC on Monday Zometa/Eliguard today RTC 2 weeks APP, port labs (CBC w/, hold tube, iron, ferritin) D2 Hold for possible blood transfusion RTC 1 months- MD- port labs-HOLD tube/port- cbc/cmp; PSA;  D-2 possible 1 unit PRBC RTC 6 months- MD- port labs-HOLD tube/port- cbc/cmp; PSA;  D-2 possible 1 unit PRBC +- Zometa, Eliguard        Rushie Chestnut, PA-C 04/21/2023 4:24 PM

## 2023-04-21 NOTE — Progress Notes (Signed)
Nutrition Follow-up:  Referral for poor appetite  Patient with metastatic prostate cancer with bone mets. Patient on ADT/eligard/pluvicto.    Met with patient during infusion. Reports that his appetite is a little bit better.   Ate oatmeal for breakfast this am. Planning to get food after finishes at the cancer center.  Had a chicken sandwich for supper last night.  Drinks ensure shakes but not daily.     Medications: zyprexa  Labs: Hgb 6.8  Anthropometrics:   Weight 134 lb today  146 lb on 9/18 141 lb on 8/16 148 lb on 6/14 151 lb on 5/24 149 lb on 3/22 151 lb on 1/18   NUTRITION DIAGNOSIS: Inadequate food intake slight improvement    INTERVENTION:  Recommend 350 calorie shake at least 1-2 times per day. Coupons given Discussed foods high in calories and protein. Snack list provided Contact information provided    MONITORING, EVALUATION, GOAL: weight trends, intake   NEXT VISIT: Tuesday, July 30 during infusion  Isaish Alemu B. Freida Busman, RD, LDN Registered Dietitian [phone]

## 2023-04-21 NOTE — Progress Notes (Signed)
Pt received Eligard on 6/28.    Cancel Eliagrd for 7/30; keep other appts as planned- GB

## 2023-04-21 NOTE — Progress Notes (Signed)
Call from lab ~ critical result. Hgb is 6.9 Read back. Clent Jacks PA given results.

## 2023-04-21 NOTE — Progress Notes (Signed)
Calcium 8.3, Per Clent Jacks PA okay to proceed with Zometa and Eligard at this time.

## 2023-04-24 ENCOUNTER — Inpatient Hospital Stay: Payer: Medicare HMO

## 2023-04-24 ENCOUNTER — Inpatient Hospital Stay: Payer: Medicare HMO | Attending: Internal Medicine

## 2023-04-24 DIAGNOSIS — D630 Anemia in neoplastic disease: Secondary | ICD-10-CM | POA: Diagnosis not present

## 2023-04-24 DIAGNOSIS — Z808 Family history of malignant neoplasm of other organs or systems: Secondary | ICD-10-CM | POA: Insufficient documentation

## 2023-04-24 DIAGNOSIS — M549 Dorsalgia, unspecified: Secondary | ICD-10-CM | POA: Insufficient documentation

## 2023-04-24 DIAGNOSIS — F17210 Nicotine dependence, cigarettes, uncomplicated: Secondary | ICD-10-CM | POA: Insufficient documentation

## 2023-04-24 DIAGNOSIS — Z66 Do not resuscitate: Secondary | ICD-10-CM | POA: Diagnosis not present

## 2023-04-24 DIAGNOSIS — D6959 Other secondary thrombocytopenia: Secondary | ICD-10-CM | POA: Insufficient documentation

## 2023-04-24 DIAGNOSIS — Z800 Family history of malignant neoplasm of digestive organs: Secondary | ICD-10-CM | POA: Insufficient documentation

## 2023-04-24 DIAGNOSIS — C61 Malignant neoplasm of prostate: Secondary | ICD-10-CM | POA: Diagnosis present

## 2023-04-24 DIAGNOSIS — D649 Anemia, unspecified: Secondary | ICD-10-CM

## 2023-04-24 DIAGNOSIS — C7951 Secondary malignant neoplasm of bone: Secondary | ICD-10-CM | POA: Diagnosis not present

## 2023-04-24 LAB — BPAM RBC: Blood Product Expiration Date: 202407182359

## 2023-04-24 LAB — TYPE AND SCREEN

## 2023-04-24 MED ORDER — HEPARIN SOD (PORK) LOCK FLUSH 100 UNIT/ML IV SOLN
500.0000 [IU] | Freq: Every day | INTRAVENOUS | Status: AC | PRN
  Administered 2023-04-24: 500 [IU]
  Filled 2023-04-24: qty 5

## 2023-04-24 MED ORDER — SODIUM CHLORIDE 0.9% IV SOLUTION
250.0000 mL | Freq: Once | INTRAVENOUS | Status: AC
  Administered 2023-04-24: 250 mL via INTRAVENOUS
  Filled 2023-04-24: qty 250

## 2023-04-24 MED ORDER — ACETAMINOPHEN 325 MG PO TABS
650.0000 mg | ORAL_TABLET | Freq: Once | ORAL | Status: AC
  Administered 2023-04-24: 650 mg via ORAL
  Filled 2023-04-24: qty 2

## 2023-04-24 MED ORDER — DIPHENHYDRAMINE HCL 25 MG PO CAPS
25.0000 mg | ORAL_CAPSULE | Freq: Once | ORAL | Status: AC
  Administered 2023-04-24: 25 mg via ORAL
  Filled 2023-04-24: qty 1

## 2023-04-25 LAB — TYPE AND SCREEN: ABO/RH(D): O POS

## 2023-04-25 LAB — BPAM RBC: Unit Type and Rh: 5100

## 2023-05-04 ENCOUNTER — Other Ambulatory Visit (HOSPITAL_COMMUNITY): Payer: Medicare HMO

## 2023-05-05 ENCOUNTER — Other Ambulatory Visit: Payer: Self-pay

## 2023-05-05 ENCOUNTER — Inpatient Hospital Stay: Payer: Medicare HMO

## 2023-05-05 ENCOUNTER — Other Ambulatory Visit: Payer: Self-pay | Admitting: *Deleted

## 2023-05-05 ENCOUNTER — Inpatient Hospital Stay (HOSPITAL_BASED_OUTPATIENT_CLINIC_OR_DEPARTMENT_OTHER): Payer: Medicare HMO | Admitting: Medical Oncology

## 2023-05-05 ENCOUNTER — Encounter: Payer: Self-pay | Admitting: Medical Oncology

## 2023-05-05 VITALS — BP 123/70 | HR 106 | Temp 97.5°F | Wt 128.0 lb

## 2023-05-05 DIAGNOSIS — R634 Abnormal weight loss: Secondary | ICD-10-CM | POA: Diagnosis not present

## 2023-05-05 DIAGNOSIS — D649 Anemia, unspecified: Secondary | ICD-10-CM

## 2023-05-05 DIAGNOSIS — R748 Abnormal levels of other serum enzymes: Secondary | ICD-10-CM

## 2023-05-05 DIAGNOSIS — C61 Malignant neoplasm of prostate: Secondary | ICD-10-CM | POA: Diagnosis not present

## 2023-05-05 DIAGNOSIS — C7951 Secondary malignant neoplasm of bone: Secondary | ICD-10-CM | POA: Diagnosis not present

## 2023-05-05 LAB — CBC WITH DIFFERENTIAL (CANCER CENTER ONLY)
Abs Immature Granulocytes: 0.02 10*3/uL (ref 0.00–0.07)
Basophils Absolute: 0 10*3/uL (ref 0.0–0.1)
Basophils Relative: 0 %
Eosinophils Absolute: 0 10*3/uL (ref 0.0–0.5)
Eosinophils Relative: 0 %
HCT: 24.2 % — ABNORMAL LOW (ref 39.0–52.0)
Hemoglobin: 7.7 g/dL — ABNORMAL LOW (ref 13.0–17.0)
Immature Granulocytes: 1 %
Lymphocytes Relative: 24 %
Lymphs Abs: 1 10*3/uL (ref 0.7–4.0)
MCH: 33.5 pg (ref 26.0–34.0)
MCHC: 31.8 g/dL (ref 30.0–36.0)
MCV: 105.2 fL — ABNORMAL HIGH (ref 80.0–100.0)
Monocytes Absolute: 0.4 10*3/uL (ref 0.1–1.0)
Monocytes Relative: 9 %
Neutro Abs: 2.8 10*3/uL (ref 1.7–7.7)
Neutrophils Relative %: 66 %
Platelet Count: 47 10*3/uL — ABNORMAL LOW (ref 150–400)
RBC: 2.3 MIL/uL — ABNORMAL LOW (ref 4.22–5.81)
RDW: 20.9 % — ABNORMAL HIGH (ref 11.5–15.5)
WBC Count: 4.2 10*3/uL (ref 4.0–10.5)
nRBC: 0 % (ref 0.0–0.2)

## 2023-05-05 LAB — IRON AND TIBC
Iron: 67 ug/dL (ref 45–182)
Saturation Ratios: 31 % (ref 17.9–39.5)
TIBC: 218 ug/dL — ABNORMAL LOW (ref 250–450)
UIBC: 151 ug/dL

## 2023-05-05 LAB — SAMPLE TO BLOOD BANK

## 2023-05-05 LAB — FERRITIN: Ferritin: 1251 ng/mL — ABNORMAL HIGH (ref 24–336)

## 2023-05-05 LAB — PREPARE RBC (CROSSMATCH)

## 2023-05-05 MED FILL — Dexamethasone Sodium Phosphate Inj 100 MG/10ML: INTRAMUSCULAR | Qty: 1 | Status: AC

## 2023-05-05 NOTE — Progress Notes (Signed)
[facility] CONSULT NOTE  Patient Care Team: Alba Cory, MD as PCP - General (Family Medicine) Earna Coder, MD as Consulting Physician (Oncology) Riki Altes, MD (Urology)  CHIEF COMPLAINTS/PURPOSE OF CONSULTATION: anemia.   Oncology History Overview Note  compatible with widespread metastatic prostate cancer, including innumerable sclerotic osseous lesions and extensive lymphadenopathy in the pelvis and retroperitoneum, as detailed above. 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 2. There is also an indeterminate hypovascular lesion in segment 5 of the liver which may represent a metastatic lesion. This could be better characterized with follow-up abdominal MRI with and without IV gadolinium if clinically appropriate. 3. Aortic atherosclerosis with mild fusiform aneurysmal dilatation of the infrarenal abdominal aorta measuring up to 3.2 x 3.0 cm. Recommend follow-up ultrasound every 3 years. This recommendation follows ACR consensus guidelines: White Paper of the ACR Incidental Findings Committee II on Vascular Findings. J Am Coll Radiol 2013; 10:789-794. 4. Bladder wall appears mildly thickened, likely related to bladder outlet obstruction given the enlarged prostate gland. 5. Severe colonic diverticulosis without evidence of acute diverticulitis at this time.  IMPRESSION: There are numerous foci of abnormal tracer uptake in the axial and proximal appendicular skeleton consistent with extensive skeletal metastatic disease.     #Stage IV castrate sensitive prostate cancer-December/20 2023-MRI negative for any liver lesions.; FEB 24th, 2022-Firmagon loading dose;  # march 24th-Taxoeter with udenyca; 4/12- add darolutamide  # DEC 2022- 374 [[facility]; Urology]   Prostate cancer (HCC)  12/13/2021 Initial Diagnosis   Prostate cancer  (HCC)   12/13/2021 Cancer Staging   Staging form: Prostate, AJCC 8th Edition - Clinical: Stage IVB (cT2c, cN1, cM1, PSA: 374, Grade Group: 5) - Signed by Earna Coder, MD on 12/13/2021 Prostate specific antigen (PSA) range: 20 or greater Histologic grading system: 5 grade system   01/12/2022 - 04/28/2022 Chemotherapy   Patient is on Treatment Plan : PROSTATE Docetaxel + Prednisone q21d      Genetic Testing   Negative genetic testing. No pathogenic variants identified on the [facility] Common Hereditary Cancers +RNA panel. VUS in BRIP1 called c.485G>T identified. The report date is 03/04/2022.  The Common Hereditary Cancers Panel + RNA offered by [facility] includes sequencing and/or deletion duplication testing of the following 47 genes: APC, ATM, AXIN2, BARD1, BMPR1A, BRCA1, BRCA2, BRIP1, CDH1, CDKN2A (p14ARF), CDKN2A (p16INK4a), CKD4, CHEK2, CTNNA1, DICER1, EPCAM (Deletion/duplication testing only), GREM1 (promoter region deletion/duplication testing only), KIT, MEN1, MLH1, MSH2, MSH3, MSH6, MUTYH, NBN, NF1, NHTL1, PALB2, PDGFRA, PMS2, POLD1, POLE, PTEN, RAD50, RAD51C, RAD51D, SDHB, SDHC, SDHD, SMAD4, SMARCA4. STK11, TP53, TSC1, TSC2, and VHL.  The following genes were evaluated for sequence changes only: SDHA and HOXB13 c.251G>A variant only.    HISTORY OF PRESENTING ILLNESS: Patient ambulating with a  cane.   Accompanied by Standley Brooking 82 y.o.  male pleasant patient above history of castrate resistant metastatic prostate cancer formerly on Eligard plus pluvicto is here for follow-up to see if he would benefit from a blood transfusion which is scheduled for Monday.  He is here with his grandson.   Currently s/p #4 on 5/30- PSA  has trended up to >1,500. After his last office visit, and at the advisement of Dr. [name], we discussed treatment options with Dr. Roseanne Reno. Per Dr. Roseanne Reno he would advise canceling his XRT #5 and 6 as well as holding his plavicto due to progression  and  myelosuppression. Hgb 6.9, platelets 64. Cabazitaxel, next line of therapy, not recommended given the myelosuppression.   Today is a contrast from his last visit. He is in a wheelchair, has new back pain, and is fairly fatigued. Back pain occurred a few days ago without injury. No saddle anesthesia, incontinence or new weakness. He has taken 1-2 tylenol daily which has helped with the pain. He declines any additional pain medication.   He also reports mild increase in length of time that it takes him to urinate. He does feel that he is emptying his bladder. He denies any bleeding episodes.  Appetite and weight are down.   Wt Readings from Last 3 Encounters:  05/05/23 128 lb (58.1 kg)  04/21/23 134 lb (60.8 kg)  03/24/23 134 lb 6.4 oz (61 kg)   Review of Systems  Constitutional:  Positive for malaise/fatigue and weight loss. Negative for chills, diaphoresis and fever.  HENT:  Negative for nosebleeds and sore throat.   Eyes:  Negative for double vision.  Respiratory:  Negative for cough, hemoptysis, sputum production, shortness of breath and wheezing.   Cardiovascular:  Negative for chest pain, palpitations, orthopnea and leg swelling.  Gastrointestinal:  Negative for abdominal pain, blood in stool, constipation, diarrhea, heartburn, melena, nausea and vomiting.  Genitourinary:  Negative for dysuria, frequency and urgency.  Musculoskeletal:  Positive for back pain. Negative for joint pain.  Skin: Negative.  Negative for itching and rash.  Neurological:  Negative for dizziness, tingling, focal weakness, weakness and headaches.  Endo/Heme/Allergies:  Does not bruise/bleed easily.  Psychiatric/Behavioral:  Negative for depression. The patient is not nervous/anxious and does not have insomnia.     MEDICAL HISTORY:  Past Medical History:  Diagnosis Date   Cancer (HCC)    COPD (chronic obstructive pulmonary disease) (HCC)    Dyspnea    with exertion   Family history of cancer    GERD  (gastroesophageal reflux disease)    occ   Hyperlipidemia    Hypertension     SURGICAL HISTORY: Past Surgical History:  Procedure Laterality Date   COLONOSCOPY     HEMORROIDECTOMY     IR IMAGING GUIDED PORT INSERTION  12/20/2021   PROSTATE BIOPSY N/A 10/26/2021   Procedure: PROSTATE BIOPSY;  Surgeon: Riki Altes, MD;  Location: [facility] ORS;  Service: Urology;  Laterality: N/A;   TRANSRECTAL ULTRASOUND N/A 10/26/2021   Procedure: TRANSRECTAL ULTRASOUND;  Surgeon: Riki Altes, MD;  Location: [facility] ORS;  Service: Urology;  Laterality: N/A;    SOCIAL HISTORY: Social History   Socioeconomic History   Marital status: Single    Spouse name: Not on file   Number of children: 1   Years of education: Not on file   Highest education level: High school graduate  Occupational History   Occupation: retired   Tobacco Use   Smoking status: Every Day    Current packs/day: 0.50    Average packs/day: 0.5 packs/day for 60.0 years (30.0 ttl pk-yrs)    Types: Cigarettes   Smokeless tobacco: Never   Tobacco comments:    smoking cessation information provided  Vaping Use   Vaping status: Never Used  Substance and Sexual Activity   Alcohol use: Not Currently   Drug use: Not Currently   Sexual activity: Not Currently  Other Topics Concern   Not on file  Social History Narrative   Single, lives with male friend Rock Nephew is care giver] for the past 20 plus years. 1/2 ppd smoke; no alcohol. 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 Daughter lives 10-15 mins; in [place].    Social Determinants of Health   Financial Resource Strain: Low Risk  (02/10/2022)   Overall Financial Resource Strain (CARDIA)    Difficulty of Paying Living Expenses: Not hard at all  Food Insecurity: No Food Insecurity (02/10/2022)   Hunger Vital Sign    Worried About Running Out of Food in the Last Year: Never true    Ran Out of Food in the Last Year: Never true  Transportation Needs: No Transportation  Needs (02/10/2022)   PRAPARE - Administrator, Civil Service (Medical): No    Lack of Transportation (Non-Medical): No  Physical Activity: Inactive (02/10/2022)   Exercise Vital Sign    Days of Exercise per Week: 0 days    Minutes of Exercise per Session: 0 min  Stress: No Stress Concern Present (02/10/2022)   [employer] of Occupational Health - Occupational Stress Questionnaire    Feeling of Stress : Only a little  Social Connections: Socially Isolated (02/10/2022)   Social Connection and Isolation Panel [NHANES]    Frequency of Communication with Friends and Family: More than three times a week    Frequency of Social Gatherings with Friends and Family: Three times a week    Attends Religious Services: Never    Active Member of Clubs or Organizations: No    Attends Banker Meetings: Never    Marital Status: Widowed  Intimate Partner Violence: Not At Risk (02/10/2022)   Humiliation, Afraid, Rape, and Kick questionnaire    Fear of Current or Ex-Partner: No    Emotionally Abused: No    Physically Abused: No    Sexually Abused: No    FAMILY HISTORY: Family History  Problem Relation Age of Onset   Heart disease Mother    COPD Father    Cancer Brother 25       blood cancer; unk exact type   Cancer Maternal Aunt        unk type   Cancer Paternal Aunt        unk type   Cancer Paternal Uncle        one unk type; one had throat   Cancer Cousin        unk types    ALLERGIES:  has No Known Allergies.  MEDICATIONS:  Current Outpatient Medications  Medication Sig Dispense Refill   acetaminophen (TYLENOL) 500 MG tablet Take 1 tablet (500 mg total) by mouth every 6 (six) hours as needed. 30 tablet 0   albuterol (VENTOLIN HFA) 108 (90 Base) MCG/ACT inhaler TAKE 2 PUFFS BY MOUTH EVERY 6 HOURS AS NEEDED FOR WHEEZE OR SHORTNESS OF BREATH 18 each 5   atorvastatin (LIPITOR) 40 MG tablet TAKE 1 TABLET (40 MG TOTAL) BY MOUTH DAILY. IN PLACE OF ROSUVASTATIN DUE TO  CANER TREATMENT 90 tablet 1   Ca Carbonate-Mag Hydroxide (ROLAIDS PO) Take 1 tablet by mouth as needed.     ezetimibe (ZETIA) 10 MG tablet Take 1 tablet (10 mg total) by mouth every morning. 90 tablet 1   Fluticasone-Umeclidin-Vilant (TRELEGY ELLIPTA) 100-62.5-25 MCG/ACT AEPB Inhale 1 puff into the lungs daily. 3 each 1   lidocaine-prilocaine (EMLA) cream Apply on the port. 30 -45 min  prior to port access. 30 g 3   lisinopril (ZESTRIL) 2.5 MG tablet Take by mouth.     metoprolol succinate (TOPROL-XL) 25 MG 24 hr tablet Take 1 tablet (25 mg total) by mouth daily.  Take with or immediately following a meal. 90 tablet 1   OLANZapine (ZYPREXA) 5 MG tablet TAKE 1 TABLET BY MOUTH EVERYDAY AT BEDTIME 90 tablet 2   prochlorperazine (COMPAZINE) 10 MG tablet Take 1 tablet (10 mg total) by mouth every 6 (six) hours as needed for nausea or vomiting. 40 tablet 1   tamsulosin (FLOMAX) 0.4 MG CAPS capsule Take 1 capsule (0.4 mg total) by mouth daily after supper. 90 capsule 1   gabapentin (NEURONTIN) 100 MG capsule Take 1 pill at nighttime for 1 week.  If tolerating well take 2 pills at night. (Patient not taking: Reported on 08/10/2022) 60 capsule 0   ondansetron (ZOFRAN) 8 MG tablet One pill every 8 hours as needed for nausea/vomitting. (Patient not taking: Reported on 03/24/2023) 40 tablet 1   No current facility-administered medications for this visit.   Vitals:   05/05/23 1114  BP: 123/70  Pulse: (!) 106  Temp: (!) 97.5 F (36.4 C)  SpO2: 100%   Filed Weights   05/05/23 1114  Weight: 128 lb (58.1 kg)    Physical Exam Vitals and nursing note reviewed.  Constitutional:      Comments: In a wheelchair  HENT:     Head: Normocephalic and atraumatic.     Mouth/Throat:     Pharynx: Oropharynx is clear.  Eyes:     Extraocular Movements: Extraocular movements intact.     Pupils: Pupils are equal, round, and reactive to light.  Cardiovascular:     Rate and Rhythm: Normal rate and regular rhythm.   Pulmonary:     Breath sounds: Normal breath sounds.     Comments: Decreased breath sounds bilaterally.  Abdominal:     Palpations: Abdomen is soft.  Musculoskeletal:        General: Tenderness (scant tenderness of the L1-L2 area) present. Normal range of motion.     Cervical back: Normal range of motion.  Skin:    General: Skin is warm.  Neurological:     General: No focal deficit present.     Mental Status: He is alert and oriented to person, place, and time.  Psychiatric:        Behavior: Behavior normal.        Judgment: Judgment normal.     LABORATORY DATA:  I have reviewed the data as listed Lab Results  Component Value Date   WBC 4.2 05/05/2023   HGB 7.7 (L) 05/05/2023   HCT 24.2 (L) 05/05/2023   MCV 105.2 (H) 05/05/2023   PLT 47 (L) 05/05/2023   Recent Labs    03/13/23 1334 03/24/23 1252 04/21/23 1253  NA 136 136 141  K 4.3 4.1 3.7  CL 107 109 113*  CO2 19* 19* 19*  GLUCOSE 98 124* 115*  BUN 18 18 10   CREATININE 0.99 0.96 0.79  CALCIUM 8.4* 8.5* 8.3*  GFRNONAA >60 >60 >60  PROT 6.3* 6.7 5.6*  ALBUMIN 3.5 3.2* 3.1*  AST 26 25 19   ALT 8 9 7   ALKPHOS 1,496* 922* 1,159*  BILITOT 0.4 0.5 0.5   Prostate cancer (HCC) #Stage IV-castrate resistant prostate cancer [OCT 2023] with multiple bone metastases/extensive lymphadenopathy; Currently s/p cycle # 6 of Taxotere [60 mg per metered square]- finished JULY, 2023. NOV 2023-PET scan shows multiple progressive bone lesions; shows no focal activity in the prostate/abdominal lymph nodes.    # Currently on  ADT-Eligard + Pluvicto q 6 W- with Dr.Stewart.  Currently s/p #4 on 5/30- PSA  has trended up to >1,500.  After his last office visit, and at the advisement of Dr. [name] we discussed treatment options with Dr. Roseanne Reno. Per Dr. Roseanne Reno he would advise canceling his XRT #5 and 6 as well as holding his plavicto due to progression and myelosuppression. Cabazitaxel, next line of therapy, not recommended given the  myelosuppression. Today I discussed our current goal of supportive therapy while we watch and see if his myelosuppression will improve enough to where he is a candidate for additional treatment. At this time I have also suggested palliative care input. I have also suggested imaging of his spine. Today patient declines pain medication, palliative care visit and imaging as his brother is at the Hospice home and he wants to focus on his time left with him. He is agreeable to return on Monday for his blood transfusion, labs, palliative care visit. We reviewed red flag signs and symptoms regarding his urination and pain.    # Weight loss/ dyspnea/fatigue-Chronic secondary to disease state. Formerly doing well on the Zyprexa. Weight is down again. Continue nutritional efforts as recommended by nutrition.    # PN- from taxotere- G-2-3; Right UE weakness- S/p evaluation to Dr. Cannon Kettle post MRI of the cervical spine. Stable.    # Hematology: Anemia/thrombocytopenia-  secondary to underlying prostate cancer/chronic disease.  FEB 2024- No Iron def;  Prn PRBC transfusion.  Hemoglobin 7.7, platelets 47 today. He would benefit from a blood transfusion on Monday.    # Prostatism symptoms: Continue Flomax. Reviewed red flags.    # Diffuse bone metastases/castrate resistant prostate cancer -currently on Zometa every 2 months or so.  Last Zometa was on 04/21/2023. Continue  ca+Vit D BID. JAN 2024- vit D 42   # Elevated Alk Phos level: Secondary to disease progression. Waxes and wanes from 900s-1,496.    # IV access: functional refilled-port flush every 2 to 3 months.  Emla cream.   # eligard q 39M-last 04/21/2023   # DISPOSITION:   RTC Monday 1 unit PRBC, palliative care consult  RTC 2 weeks- MD- port labs-HOLD tube/port- cbc/cmp; PSA;  D-2 possible 1 unit PRBC (already scheduled) RTC 6 months- MD- port labs-HOLD tube/port- cbc/cmp; PSA;  D-2 possible 1 unit PRBC +- Zometa, Eliguard (already scheduled)      Rushie Chestnut, PA-C 05/05/2023 12:57 PM

## 2023-05-07 ENCOUNTER — Other Ambulatory Visit: Payer: Self-pay | Admitting: Internal Medicine

## 2023-05-08 ENCOUNTER — Inpatient Hospital Stay (HOSPITAL_BASED_OUTPATIENT_CLINIC_OR_DEPARTMENT_OTHER): Payer: Medicare HMO | Admitting: Hospice and Palliative Medicine

## 2023-05-08 ENCOUNTER — Inpatient Hospital Stay: Payer: Medicare HMO

## 2023-05-08 DIAGNOSIS — C7951 Secondary malignant neoplasm of bone: Secondary | ICD-10-CM

## 2023-05-08 DIAGNOSIS — D649 Anemia, unspecified: Secondary | ICD-10-CM

## 2023-05-08 DIAGNOSIS — Z515 Encounter for palliative care: Secondary | ICD-10-CM

## 2023-05-08 DIAGNOSIS — G893 Neoplasm related pain (acute) (chronic): Secondary | ICD-10-CM

## 2023-05-08 DIAGNOSIS — C61 Malignant neoplasm of prostate: Secondary | ICD-10-CM | POA: Diagnosis not present

## 2023-05-08 LAB — BPAM RBC: Unit Type and Rh: 5100

## 2023-05-08 LAB — TYPE AND SCREEN: Antibody Screen: NEGATIVE

## 2023-05-08 MED ORDER — ACETAMINOPHEN 325 MG PO TABS
650.0000 mg | ORAL_TABLET | Freq: Once | ORAL | Status: AC
  Administered 2023-05-08: 650 mg via ORAL
  Filled 2023-05-08: qty 2

## 2023-05-08 MED ORDER — DIPHENHYDRAMINE HCL 25 MG PO CAPS
25.0000 mg | ORAL_CAPSULE | Freq: Once | ORAL | Status: AC
  Administered 2023-05-08: 25 mg via ORAL
  Filled 2023-05-08: qty 1

## 2023-05-08 MED ORDER — HYDROCODONE-ACETAMINOPHEN 5-325 MG PO TABS: 1.0000 | ORAL_TABLET | Freq: Four times a day (QID) | ORAL | 0 refills | Status: DC | PRN

## 2023-05-08 MED ORDER — HEPARIN SOD (PORK) LOCK FLUSH 100 UNIT/ML IV SOLN
500.0000 [IU] | Freq: Every day | INTRAVENOUS | Status: AC | PRN
  Administered 2023-05-08: 500 [IU]
  Filled 2023-05-08: qty 5

## 2023-05-08 MED ORDER — SODIUM CHLORIDE 0.9% IV SOLUTION
250.0000 mL | Freq: Once | INTRAVENOUS | Status: AC
  Administered 2023-05-08: 250 mL via INTRAVENOUS
  Filled 2023-05-08: qty 250

## 2023-05-08 MED ORDER — SODIUM CHLORIDE 0.9% FLUSH
3.0000 mL | INTRAVENOUS | Status: AC | PRN
  Administered 2023-05-08: 10 mL
  Filled 2023-05-08: qty 3

## 2023-05-08 NOTE — Progress Notes (Signed)
Palliative Medicine [facility] at [facility] [phone] [phone]   Name: Eric [name] Date: 05/08/2023 MRN: [id number]  DOB: 12/21/1940  Patient Care Team: Alba Cory, MD as PCP - General (Family Medicine) Earna Coder, MD as Consulting Physician (Oncology) Riki Altes, MD (Urology)    REASON FOR CONSULTATION: Eric [name] is a 82 y.o. 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  Patient currently on treatment with ADT/Eligard/Pluvicto.  He was referred to palliative care to address goals and manage ongoing symptoms.  SOCIAL HISTORY:     reports that he has been smoking cigarettes. He has a 30 pack-year smoking history. He has never used smokeless tobacco. He reports that he does not currently use alcohol. He reports that he does not currently use drugs.  Patient lives at home with a girlfriend.  He has a daughter and granddaughter who live nearby and are involved.  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  ADVANCE DIRECTIVES:    CODE STATUS:   PAST MEDICAL HISTORY: Past Medical History:  Diagnosis Date   Cancer (HCC)    COPD (chronic obstructive pulmonary disease) (HCC)    Dyspnea    with exertion   Family history of cancer    GERD (gastroesophageal reflux disease)    occ   Hyperlipidemia    Hypertension     PAST SURGICAL HISTORY:  Past Surgical History:  Procedure Laterality Date   COLONOSCOPY     HEMORROIDECTOMY     IR IMAGING GUIDED PORT INSERTION  12/20/2021   PROSTATE BIOPSY N/A 10/26/2021   Procedure: PROSTATE BIOPSY;  Surgeon: Riki Altes, MD;  Location: [facility] ORS;  Service: Urology;  Laterality: N/A;   TRANSRECTAL ULTRASOUND N/A 10/26/2021   Procedure: TRANSRECTAL ULTRASOUND;  Surgeon: Riki Altes, MD;   Location: [facility] ORS;  Service: Urology;  Laterality: N/A;    HEMATOLOGY/ONCOLOGY HISTORY:  Oncology History Overview Note  compatible with widespread metastatic prostate cancer, including innumerable sclerotic osseous lesions and extensive lymphadenopathy in the pelvis and retroperitoneum, as detailed above. 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 2. There is also an indeterminate hypovascular lesion in segment 5 of the liver which may represent a metastatic lesion. This could be better characterized with follow-up abdominal MRI with and without IV gadolinium if clinically appropriate. 3. Aortic atherosclerosis with mild fusiform aneurysmal dilatation of the infrarenal abdominal aorta measuring up to 3.2 x 3.0 cm. Recommend follow-up ultrasound every 3 years. This recommendation follows ACR consensus guidelines: White Paper of the ACR Incidental Findings Committee II on Vascular Findings. J Am Coll Radiol 2013; 10:789-794. 4. Bladder wall appears mildly thickened, likely related to bladder outlet obstruction given the enlarged prostate gland. 5. Severe colonic diverticulosis without evidence of acute diverticulitis at this time.  IMPRESSION: There are numerous foci of abnormal tracer uptake in the axial and proximal appendicular skeleton consistent with extensive skeletal metastatic disease.     #Stage IV castrate sensitive prostate cancer-December/20 2023-MRI negative for any liver lesions.; FEB 24th, 2022-Firmagon loading dose;  # march 24th-Taxoeter with udenyca; 4/12- add darolutamide  # DEC 2022- 374 [[facility]; Urology]   Prostate cancer (HCC)  12/13/2021 Initial Diagnosis   Prostate cancer (HCC)   12/13/2021 Cancer Staging   Staging form:  Prostate, AJCC 8th Edition - Clinical: Stage IVB (cT2c, cN1, cM1, PSA: 374, Grade Group: 5) - Signed by Earna Coder, MD on 12/13/2021 Prostate specific antigen (PSA) range: 20 or greater Histologic grading system: 5 grade system   01/12/2022 - 04/28/2022 Chemotherapy   Patient is on Treatment Plan : PROSTATE Docetaxel + Prednisone q21d      Genetic Testing   Negative genetic testing. No pathogenic variants identified on the [facility] Common Hereditary Cancers +RNA panel. VUS in BRIP1 called c.485G>T identified. The report date is 03/04/2022.  The Common Hereditary Cancers Panel + RNA offered by [facility] includes sequencing and/or deletion duplication testing of the following 47 genes: APC, ATM, AXIN2, BARD1, BMPR1A, BRCA1, BRCA2, BRIP1, CDH1, CDKN2A (p14ARF), CDKN2A (p16INK4a), CKD4, CHEK2, CTNNA1, DICER1, EPCAM (Deletion/duplication testing only), GREM1 (promoter region deletion/duplication testing only), KIT, MEN1, MLH1, MSH2, MSH3, MSH6, MUTYH, NBN, NF1, NHTL1, PALB2, PDGFRA, PMS2, POLD1, POLE, PTEN, RAD50, RAD51C, RAD51D, SDHB, SDHC, SDHD, SMAD4, SMARCA4. STK11, TP53, TSC1, TSC2, and VHL.  The following genes were evaluated for sequence changes only: SDHA and HOXB13 c.251G>A variant only.     ALLERGIES:  has No Known Allergies.  MEDICATIONS:  Current Outpatient Medications  Medication Sig Dispense Refill   acetaminophen (TYLENOL) 500 MG tablet Take 1 tablet (500 mg total) by mouth every 6 (six) hours as needed. 30 tablet 0   albuterol (VENTOLIN HFA) 108 (90 Base) MCG/ACT inhaler TAKE 2 PUFFS BY MOUTH EVERY 6 HOURS AS NEEDED FOR WHEEZE OR SHORTNESS OF BREATH 18 each 5   atorvastatin (LIPITOR) 40 MG tablet TAKE 1 TABLET (40 MG TOTAL) BY MOUTH DAILY. IN PLACE OF ROSUVASTATIN DUE TO CANER TREATMENT 90 tablet 1   Ca Carbonate-Mag Hydroxide (ROLAIDS PO) Take 1 tablet by mouth as needed.     ezetimibe (ZETIA) 10 MG tablet Take 1 tablet (10 mg total) by mouth every morning. 90 tablet 1   Fluticasone-Umeclidin-Vilant (TRELEGY ELLIPTA) 100-62.5-25 MCG/ACT AEPB Inhale 1 puff into the lungs daily. 3 each 1   gabapentin  (NEURONTIN) 100 MG capsule Take 1 pill at nighttime for 1 week.  If tolerating well take 2 pills at night. (Patient not taking: Reported on 08/10/2022) 60 capsule 0   lidocaine-prilocaine (EMLA) cream Apply on the port. 30 -45 min  prior to port access. 30 g 3   lisinopril (ZESTRIL) 2.5 MG tablet Take by mouth.     metoprolol succinate (TOPROL-XL) 25 MG 24 hr tablet Take 1 tablet (25 mg total) by mouth daily. Take with or immediately following a meal. 90 tablet 1   OLANZapine (ZYPREXA) 5 MG tablet TAKE 1 TABLET BY MOUTH EVERYDAY AT BEDTIME 90 tablet 2   ondansetron (ZOFRAN) 8 MG tablet One pill every 8 hours as needed for nausea/vomitting. (Patient not taking: Reported on 03/24/2023) 40 tablet 1   prochlorperazine (COMPAZINE) 10 MG tablet Take 1 tablet (10 mg total) by mouth every 6 (six) hours as needed for nausea or vomiting. 40 tablet 1   tamsulosin (FLOMAX) 0.4 MG CAPS capsule Take 1 capsule (0.4 mg total) by mouth daily after supper. 90 capsule 1   No current facility-administered medications for this visit.   Facility-Administered Medications Ordered in Other Visits  Medication Dose Route Frequency Provider Last Rate Last Admin   heparin lock flush 100 unit/mL  500 Units Intracatheter Daily PRN Clent Jacks M, PA-C       sodium chloride flush (NS) 0.9 % injection 3 mL  3 mL Intracatheter PRN Rushie Chestnut, PA-C  VITAL SIGNS: There were no vitals taken for this visit. There were no vitals filed for this visit.  Estimated body mass index is 20.66 kg/m as calculated from the following:   Height as of 04/21/23: 5\' 6"  (1.676 m).   Weight as of 05/05/23: 128 lb (58.1 kg).  LABS: CBC:    Component Value Date/Time   WBC 4.2 05/05/2023 1055   WBC 4.4 03/23/2023 1122   HGB 7.7 (L) 05/05/2023 1055   HCT 24.2 (L) 05/05/2023 1055   PLT 47 (L) 05/05/2023 1055   MCV 105.2 (H) 05/05/2023 1055   NEUTROABS 2.8 05/05/2023 1055   LYMPHSABS 1.0 05/05/2023 1055   MONOABS 0.4  05/05/2023 1055   EOSABS 0.0 05/05/2023 1055   BASOSABS 0.0 05/05/2023 1055   Comprehensive Metabolic Panel:    Component Value Date/Time   NA 141 04/21/2023 1253   NA 142 08/17/2015 0928   K 3.7 04/21/2023 1253   CL 113 (H) 04/21/2023 1253   CO2 19 (L) 04/21/2023 1253   BUN 10 04/21/2023 1253   BUN 17 08/17/2015 0928   CREATININE 0.79 04/21/2023 1253   CREATININE 1.06 09/30/2021 1119   GLUCOSE 115 (H) 04/21/2023 1253   CALCIUM 8.3 (L) 04/21/2023 1253   AST 19 04/21/2023 1253   ALT 7 04/21/2023 1253   ALKPHOS 1,159 (H) 04/21/2023 1253   BILITOT 0.5 04/21/2023 1253   PROT 5.6 (L) 04/21/2023 1253   PROT 6.8 08/17/2015 0928   ALBUMIN 3.1 (L) 04/21/2023 1253   ALBUMIN 4.1 08/17/2015 0928    RADIOGRAPHIC STUDIES: No results found.  PERFORMANCE STATUS (ECOG) : 1 - Symptomatic but completely ambulatory  Review of Systems Unless otherwise noted, a complete review of systems is negative.  Physical Exam General: NAD Pulmonary: Unlabored Extremities: no edema, no joint deformities Skin: no rashes Neurological: Weakness but otherwise nonfocal  IMPRESSION: Follow-up visit.  Patient seen in infusion.  Patient was seen by Clent Jacks PA-C on 05/05/2023.  Patient has had rising PSA and myelosuppression.  Therefore, treatments were held.  Patient presented to clinic today for blood transfusion.  Patient says he is hopeful that he will remain a candidate for further treatment.  Symptomatically, he endorses fatigue and low back pain.  He says that he has had sharp back pain to the left of the spine over the past couple weeks.  He denies falls or other trauma.  Patient says that he has been taking Tylenol several times a day but does not find it to be helping his back pain.  Last PSMA PET on 08/22/2022 showed innumerable skeletal lesions involving the femurs, pelvis, spine, ribs, and humeri.  Will send patient for x-ray of the lumbar spine to rule out pathologic fracture.  Could  consider XRT if needed.  Will start patient on Norco for pain.  Patient advised to hold additional acetaminophen.  Discussed bowel regimen to prevent opioid-induced constipation.  PLAN: -Continue current scope of treatment -X-ray lumbar spine -Start Norco 5-325 mg every 6 hours as needed #30 -PDMP reviewed -Daily bowel regimen -Follow-up telephone visit 2-3 weeks   Patient expressed understanding and was in agreement with this plan. He also understands that He can call the clinic at any time with any questions, concerns, or complaints.     Time Total: 15 minutes  Visit consisted of counseling and education dealing with the complex and emotionally intense issues of symptom management and palliative care in the setting of serious and potentially life-threatening illness.Greater than 50%  of this  time was spent counseling and coordinating care related to the above assessment and plan.  Signed by: Laurette Schimke, PhD, NP-C

## 2023-05-09 LAB — BPAM RBC
Blood Product Expiration Date: 202408092359
ISSUE DATE / TIME: 202407150917

## 2023-05-09 LAB — TYPE AND SCREEN
ABO/RH(D): O POS
Unit division: 0

## 2023-05-15 ENCOUNTER — Inpatient Hospital Stay: Payer: Medicare HMO

## 2023-05-15 ENCOUNTER — Telehealth: Payer: Self-pay | Admitting: *Deleted

## 2023-05-15 ENCOUNTER — Inpatient Hospital Stay: Payer: Medicare HMO | Admitting: Internal Medicine

## 2023-05-15 NOTE — Telephone Encounter (Signed)
Eric [name] called erporting that patient has not been felling well for a few days and now he is "spitting up blood" She states she cannot tell how much it is because he is spitting onto a paper towel in a cup. She reports that he is spitting up very thick mucous that has the bright red blood in it She is unsure if it started today or before today. He is feeling very weak and states that "he just doesn't feel good" Please advise

## 2023-05-16 ENCOUNTER — Inpatient Hospital Stay: Payer: Medicare HMO | Admitting: Nurse Practitioner

## 2023-05-16 ENCOUNTER — Other Ambulatory Visit: Payer: Self-pay | Admitting: *Deleted

## 2023-05-16 ENCOUNTER — Encounter: Payer: Self-pay | Admitting: Internal Medicine

## 2023-05-16 ENCOUNTER — Inpatient Hospital Stay: Payer: Medicare HMO

## 2023-05-16 DIAGNOSIS — R042 Hemoptysis: Secondary | ICD-10-CM

## 2023-05-16 NOTE — Telephone Encounter (Signed)
Patient scheduled for [facility]om Management [facility] today

## 2023-05-16 NOTE — Telephone Encounter (Signed)
Pt no showed for smc visit today.

## 2023-05-17 NOTE — Telephone Encounter (Signed)
I called and spoke to [facility] - pt didn't come yesterday. He came to the pcp to check on patient. Family discovered that pt had been eating red jello at the time and thought the supposed hemoptysis was jello instead. She went on to discuss her care giver stress at home. Pt is becoming weaker, weight loss noted and had decrease appetite. pcp prescribed an appetite stimulator for patient, but [name] could not recall drug name. Pt has not started appetite stimulator as it was not at pharmacy to pick up. [name] would like pt to see Sharia Reeve, NP next week when patient sees Dr. B to discuss goals of care and complete a most form. Pt had stopped taking the norco last week due to severe constipation. Pcp reiterated the need for a bowel regimen. Patient was instructed to start back on narcotics to help with pain control. Pt has been endorsing pain in his shoulders as well has sharp pains in left side of lumbar spine. [name] stated that if patient's blood counts continue to drop, pt/family would like to discuss the option for hospice. I sent a msg to scheduling team to add [name], NP to pt's schedule next Tuesday.  She also reported that pt had an issue with his sewer lines. The maintenance guy pumped out a lot of black colored stool out of the toilet line. She asked me if this meant that pt had blood in his stools. I told [name] that I do not have an opinion on sewer lines; however, we need to ask the patient if he has experienced any dark stools when wiping after bm. She stated that pt would not be able to give you a direct answer as he is not a good historian.  For now, she would like to wait until the apt on Tuesday next week to discuss her concerns with Dr. Leonard Schwartz and Sharia Reeve, NP. She will contact our office if pt/family changes their mind or if pt has any acute concerns.

## 2023-05-23 ENCOUNTER — Inpatient Hospital Stay: Payer: Medicare HMO

## 2023-05-23 ENCOUNTER — Encounter: Payer: Self-pay | Admitting: Internal Medicine

## 2023-05-23 ENCOUNTER — Inpatient Hospital Stay (HOSPITAL_BASED_OUTPATIENT_CLINIC_OR_DEPARTMENT_OTHER): Payer: Medicare HMO | Admitting: Internal Medicine

## 2023-05-23 ENCOUNTER — Inpatient Hospital Stay (HOSPITAL_BASED_OUTPATIENT_CLINIC_OR_DEPARTMENT_OTHER): Payer: Medicare HMO | Admitting: Hospice and Palliative Medicine

## 2023-05-23 VITALS — BP 77/32 | HR 108 | Temp 97.6°F | Ht 66.0 in | Wt 118.0 lb

## 2023-05-23 DIAGNOSIS — Z515 Encounter for palliative care: Secondary | ICD-10-CM

## 2023-05-23 DIAGNOSIS — R042 Hemoptysis: Secondary | ICD-10-CM

## 2023-05-23 DIAGNOSIS — C7951 Secondary malignant neoplasm of bone: Secondary | ICD-10-CM | POA: Diagnosis not present

## 2023-05-23 DIAGNOSIS — C61 Malignant neoplasm of prostate: Secondary | ICD-10-CM

## 2023-05-23 DIAGNOSIS — D649 Anemia, unspecified: Secondary | ICD-10-CM

## 2023-05-23 DIAGNOSIS — Z95828 Presence of other vascular implants and grafts: Secondary | ICD-10-CM | POA: Diagnosis not present

## 2023-05-23 LAB — CBC WITH DIFFERENTIAL (CANCER CENTER ONLY)
Abs Immature Granulocytes: 0.02 10*3/uL (ref 0.00–0.07)
Basophils Absolute: 0 10*3/uL (ref 0.0–0.1)
Basophils Relative: 0 %
Eosinophils Absolute: 0 10*3/uL (ref 0.0–0.5)
Eosinophils Relative: 0 %
HCT: 22.2 % — ABNORMAL LOW (ref 39.0–52.0)
Hemoglobin: 7 g/dL — ABNORMAL LOW (ref 13.0–17.0)
Immature Granulocytes: 1 %
Lymphocytes Relative: 25 %
Lymphs Abs: 0.9 10*3/uL (ref 0.7–4.0)
MCH: 32.3 pg (ref 26.0–34.0)
MCHC: 31.5 g/dL (ref 30.0–36.0)
MCV: 102.3 fL — ABNORMAL HIGH (ref 80.0–100.0)
Monocytes Absolute: 0.3 10*3/uL (ref 0.1–1.0)
Monocytes Relative: 7 %
Neutro Abs: 2.4 10*3/uL (ref 1.7–7.7)
Neutrophils Relative %: 67 %
Platelet Count: 33 10*3/uL — ABNORMAL LOW (ref 150–400)
RBC: 2.17 MIL/uL — ABNORMAL LOW (ref 4.22–5.81)
RDW: 21.7 % — ABNORMAL HIGH (ref 11.5–15.5)
WBC Count: 3.6 10*3/uL — ABNORMAL LOW (ref 4.0–10.5)
nRBC: 0 % (ref 0.0–0.2)

## 2023-05-23 LAB — CMP (CANCER CENTER ONLY)
ALT: 8 U/L (ref 0–44)
AST: 42 U/L — ABNORMAL HIGH (ref 15–41)
Albumin: 2.8 g/dL — ABNORMAL LOW (ref 3.5–5.0)
Alkaline Phosphatase: 412 U/L — ABNORMAL HIGH (ref 38–126)
Anion gap: 8 (ref 5–15)
BUN: 15 mg/dL (ref 8–23)
CO2: 19 mmol/L — ABNORMAL LOW (ref 22–32)
Calcium: 8.2 mg/dL — ABNORMAL LOW (ref 8.9–10.3)
Chloride: 109 mmol/L (ref 98–111)
Creatinine: 0.97 mg/dL (ref 0.61–1.24)
GFR, Estimated: >60 mL/min (ref >60)
Glucose, Bld: 116 mg/dL — ABNORMAL HIGH (ref 70–99)
Potassium: 4 mmol/L (ref 3.5–5.1)
Sodium: 136 mmol/L (ref 135–145)
Total Bilirubin: 0.7 mg/dL (ref 0.3–1.2)
Total Protein: 5.7 g/dL — ABNORMAL LOW (ref 6.5–8.1)

## 2023-05-23 LAB — PROTIME-INR
INR: 1.3 — ABNORMAL HIGH (ref 0.8–1.2)
Prothrombin Time: 16.6 seconds — ABNORMAL HIGH (ref 11.4–15.2)

## 2023-05-23 LAB — APTT: aPTT: 102 seconds — ABNORMAL HIGH (ref 24–36)

## 2023-05-23 LAB — SAMPLE TO BLOOD BANK

## 2023-05-23 MED ORDER — HEPARIN SOD (PORK) LOCK FLUSH 100 UNIT/ML IV SOLN
500.0000 [IU] | Freq: Once | INTRAVENOUS | Status: AC
  Administered 2023-05-23: 500 [IU] via INTRAVENOUS
  Filled 2023-05-23: qty 5

## 2023-05-23 NOTE — Progress Notes (Signed)
**Eric [name]** Eric Eric [name]  Patient Care Team: Alba Cory, MD as PCP - General (Family Medicine) Earna Coder, MD as Consulting Physician (Oncology) Riki Altes, MD (Urology)  CHIEF COMPLAINTS/PURPOSE OF CONSULTATION: prostate cancer  Oncology History Overview Eric [name]  compatible with widespread metastatic prostate cancer, including innumerable sclerotic osseous lesions and extensive lymphadenopathy in the pelvis and retroperitoneum, as detailed above. 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 2. There is also an indeterminate hypovascular lesion in segment 5 of the liver which may represent a metastatic lesion. This could be better characterized with follow-up abdominal MRI with and without IV gadolinium if clinically appropriate. 3. Aortic atherosclerosis with mild fusiform aneurysmal dilatation of the infrarenal abdominal aorta measuring up to 3.2 x 3.0 cm. Recommend follow-up ultrasound every 3 years. This recommendation follows ACR consensus guidelines: White Paper of the ACR Incidental Findings Committee II on Vascular Findings. J Am Coll Radiol 2013; 10:789-794. 4. Bladder wall appears mildly thickened, likely related to bladder outlet obstruction given the enlarged prostate gland. 5. Severe colonic diverticulosis without evidence of acute diverticulitis at this time.  IMPRESSION: There are numerous foci of abnormal tracer uptake in the axial and proximal appendicular skeleton consistent with extensive skeletal metastatic disease.     #Stage IV castrate sensitive prostate cancer-December/20 2023-MRI negative for any liver lesions.; FEB 24th, 2022-Firmagon loading dose;  # march 24th-Taxoeter with udenyca; 4/12- add darolutamide  # DEC 2022- 374 [[facility]; Urology]   Prostate cancer (HCC)  12/13/2021 Initial Diagnosis   Prostate  cancer (HCC)   12/13/2021 Cancer Staging   Staging form: Prostate, AJCC 8th Edition - Clinical: Stage IVB (cT2c, cN1, cM1, PSA: 374, Grade Group: 5) - Signed by Earna Coder, MD on 12/13/2021 Prostate specific antigen (PSA) range: 20 or greater Histologic grading system: 5 grade system   01/12/2022 - 04/28/2022 Chemotherapy   Patient is on Treatment Plan : PROSTATE Docetaxel + Prednisone q21d      Genetic Testing   Negative genetic testing. No pathogenic variants identified on the [facility] Common Hereditary Cancers +RNA panel. VUS in BRIP1 called c.485G>T identified. The report date is 03/04/2022.  The Common Hereditary Cancers Panel + RNA offered by [facility] includes sequencing and/or deletion duplication testing of the following 47 genes: APC, ATM, AXIN2, BARD1, BMPR1A, BRCA1, BRCA2, BRIP1, CDH1, CDKN2A (p14ARF), CDKN2A (p16INK4a), CKD4, CHEK2, CTNNA1, DICER1, EPCAM (Deletion/duplication testing only), GREM1 (promoter region deletion/duplication testing only), KIT, MEN1, MLH1, MSH2, MSH3, MSH6, MUTYH, NBN, NF1, NHTL1, PALB2, PDGFRA, PMS2, POLD1, POLE, PTEN, RAD50, RAD51C, RAD51D, SDHB, SDHC, SDHD, SMAD4, SMARCA4. STK11, TP53, TSC1, TSC2, and VHL.  The following genes were evaluated for sequence changes only: SDHA and HOXB13 c.251G>A variant only.    HISTORY OF PRESENTING ILLNESS: Patient ambulating with a  cane.   Accompanied by Eric Eric [name].   Eric Eric [name] 82 y.o.  male pleasant patient above history of castrate resistant metastatic prostate cancer currently Eligard plus pluvicto is here for follow-up.    Pluvicto is on hold/discontinued given the progression of disease.  Patient 4 cycles so far.  Last treatment May 30.  Patient says that he feeling dizzy.  His grandson says that with his every move he looks like he is in pain.  Isn't able to eat anything and is very weak and fatigued. Sunday he wasn't able to keep his food down. Anytime he eats it goes right through him. He says  that he feels better when he lays down.    Review of Systems  Constitutional:  Positive for malaise/fatigue and weight loss. Negative for chills, diaphoresis and fever.  HENT:  Negative for nosebleeds and sore throat.   Eyes:  Negative for double vision.  Respiratory:  Positive for shortness of breath. Negative for cough, hemoptysis, sputum production and wheezing.   Cardiovascular:  Negative for chest pain, palpitations, orthopnea and leg swelling.  Gastrointestinal:  Negative for abdominal pain, blood in stool, constipation, diarrhea, heartburn, melena, nausea and vomiting.  Genitourinary:  Negative for dysuria, frequency and urgency.  Musculoskeletal:  Positive for back pain and joint pain.  Skin: Negative.  Negative for itching and rash.  Neurological:  Negative for dizziness, tingling, focal weakness, weakness and headaches.  Endo/Heme/Allergies:  Does not bruise/bleed easily.  Psychiatric/Behavioral:  Negative for depression. The patient is not nervous/anxious and does not have insomnia.     MEDICAL HISTORY:  Past Medical History:  Diagnosis Date   Cancer (HCC)    COPD (chronic obstructive pulmonary disease) (HCC)    Dyspnea    with exertion   Family history of cancer    GERD (gastroesophageal reflux disease)    occ   Hyperlipidemia    Hypertension     SURGICAL HISTORY: Past Surgical History:  Procedure Laterality Date   COLONOSCOPY     HEMORROIDECTOMY     IR IMAGING GUIDED PORT INSERTION  12/20/2021   PROSTATE BIOPSY N/A 10/26/2021   Procedure: PROSTATE BIOPSY;  Surgeon: Riki Altes, MD;  Location: [facility];  Service: Urology;  Laterality: N/A;   TRANSRECTAL ULTRASOUND N/A 10/26/2021   Procedure: TRANSRECTAL ULTRASOUND;  Surgeon: Riki Altes, MD;  Location: [facility];  Service: Urology;  Laterality: N/A;    SOCIAL HISTORY: Social History   Socioeconomic History   Marital status: Single    Spouse name: Not on file   Number of children: 1   Years of  education: Not on file   Highest education level: High school graduate  Occupational History   Occupation: retired   Tobacco Use   Smoking status: Every Day    Current packs/day: 0.50    Average packs/day: 0.5 packs/day for 60.0 years (30.0 ttl pk-yrs)    Types: Cigarettes   Smokeless tobacco: Never   Tobacco comments:    smoking cessation information provided  Vaping Use   Vaping status: Never Used  Substance and Sexual Activity   Alcohol use: Not Currently   Drug use: Not Currently   Sexual activity: Not Currently  Other Topics Concern   Not on file  Social History Narrative   Single, lives with male friend Rock Nephew is care giver] for the past 20 plus years. 1/2 ppd smoke; no alcohol. 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 Daughter lives 10-15 mins; in [place].    Social Determinants of Health   Financial Resource Strain: Low Risk  (02/10/2022)   Overall Financial Resource Strain (CARDIA)    Difficulty of Paying Living Expenses: Not hard at all  Food Insecurity: No Food Insecurity (02/10/2022)   Hunger Vital Sign    Worried About Running Out of Food in the Last Year: Never true    Ran Out of Food in the Last Year: Never true  Transportation Needs: No Transportation Needs (02/10/2022)   PRAPARE - Administrator, Civil Service (Medical): No    Lack of Transportation (Non-Medical): No  Physical Activity: Inactive (02/10/2022)   Exercise Vital Sign  Days of Exercise per Week: 0 days    Minutes of Exercise per Session: 0 min  Stress: No Stress Concern Present (02/10/2022)   [employer] of Occupational Health - Occupational Stress Questionnaire    Feeling of Stress : Only a little  Social Connections: Socially Isolated (02/10/2022)   Social Connection and Isolation Panel [NHANES]    Frequency of Communication with Friends and Family: More than three times a week    Frequency of Social Gatherings with Friends and Family: Three times a week     Attends Religious Services: Never    Active Member of Clubs or Organizations: No    Attends Banker Meetings: Never    Marital Status: Widowed  Intimate Partner Violence: Not At Risk (02/10/2022)   Humiliation, Afraid, Rape, and Kick questionnaire    Fear of Current or Ex-Partner: No    Emotionally Abused: No    Physically Abused: No    Sexually Abused: No    FAMILY HISTORY: Family History  Problem Relation Age of Onset   Heart disease Mother    COPD Father    Cancer Brother 11       blood cancer; unk exact type   Cancer Maternal Aunt        unk type   Cancer Paternal Aunt        unk type   Cancer Paternal Uncle        one unk type; one had throat   Cancer Cousin        unk types    ALLERGIES:  has No Known Allergies.  MEDICATIONS:  Current Outpatient Medications  Medication Sig Dispense Refill   acetaminophen (TYLENOL) 500 MG tablet Take 1 tablet (500 mg total) by mouth every 6 (six) hours as needed. 30 tablet 0   albuterol (VENTOLIN HFA) 108 (90 Base) MCG/ACT inhaler TAKE 2 PUFFS BY MOUTH EVERY 6 HOURS AS NEEDED FOR WHEEZE OR SHORTNESS OF BREATH 18 each 5   atorvastatin (LIPITOR) 40 MG tablet TAKE 1 TABLET (40 MG TOTAL) BY MOUTH DAILY. IN PLACE OF ROSUVASTATIN DUE TO CANER TREATMENT 90 tablet 1   Ca Carbonate-Mag Hydroxide (ROLAIDS PO) Take 1 tablet by mouth as needed.     ezetimibe (ZETIA) 10 MG tablet Take 1 tablet (10 mg total) by mouth every morning. 90 tablet 1   Fluticasone-Umeclidin-Vilant (TRELEGY ELLIPTA) 100-62.5-25 MCG/ACT AEPB Inhale 1 puff into the lungs daily. 3 each 1   HYDROcodone-acetaminophen (NORCO) 5-325 MG tablet Take 1 tablet by mouth every 6 (six) hours as needed for moderate pain. 30 tablet 0   lidocaine-prilocaine (EMLA) cream Apply on the port. 30 -45 min  prior to port access. 30 g 3   lisinopril (ZESTRIL) 2.5 MG tablet Take by mouth.     metoprolol succinate (TOPROL-XL) 25 MG 24 hr tablet Take 1 tablet (25 mg total) by mouth daily.  Take with or immediately following a meal. 90 tablet 1   OLANZapine (ZYPREXA) 5 MG tablet TAKE 1 TABLET BY MOUTH EVERYDAY AT BEDTIME 90 tablet 2   prochlorperazine (COMPAZINE) 10 MG tablet Take 1 tablet (10 mg total) by mouth every 6 (six) hours as needed for nausea or vomiting. 40 tablet 1   tamsulosin (FLOMAX) 0.4 MG CAPS capsule Take 1 capsule (0.4 mg total) by mouth daily after supper. 90 capsule 1   gabapentin (NEURONTIN) 100 MG capsule Take 1 pill at nighttime for 1 week.  If tolerating well take 2 pills at night. (Patient not taking:  Reported on 08/10/2022) 60 capsule 0   ondansetron (ZOFRAN) 8 MG tablet One pill every 8 hours as needed for nausea/vomitting. (Patient not taking: Reported on 03/24/2023) 40 tablet 1   No current facility-administered medications for this visit.    PHYSICAL EXAMINATION:   Vitals:   05/23/23 1252  BP: (!) 77/32  Pulse: (!) 108  Temp: 97.6 F (36.4 C)  SpO2: 100%    Filed Weights   05/23/23 1252  Weight: 118 lb (53.5 kg)     Physical Exam Vitals and nursing Eric [name] reviewed.  HENT:     Head: Normocephalic and atraumatic.     Mouth/Throat:     Pharynx: Oropharynx is clear.  Eyes:     Extraocular Movements: Extraocular movements intact.     Pupils: Pupils are equal, round, and reactive to light.  Cardiovascular:     Rate and Rhythm: Normal rate and regular rhythm.  Pulmonary:     Comments: Decreased breath sounds bilaterally.  Abdominal:     Palpations: Abdomen is soft.  Musculoskeletal:        General: Normal range of motion.     Cervical back: Normal range of motion.  Skin:    General: Skin is warm.  Neurological:     General: No focal deficit present.     Mental Status: He is alert and oriented to person, place, and time.  Psychiatric:        Behavior: Behavior normal.        Judgment: Judgment normal.     LABORATORY DATA:  I have reviewed the data as listed Lab Results  Component Value Date   WBC 3.6 (L) 05/23/2023   HGB  7.0 (L) 05/23/2023   HCT 22.2 (L) 05/23/2023   MCV 102.3 (H) 05/23/2023   PLT 33 (L) 05/23/2023   Recent Labs    03/24/23 1252 04/21/23 1253 05/23/23 1255  NA 136 141 136  K 4.1 3.7 4.0  CL 109 113* 109  CO2 19* 19* 19*  GLUCOSE 124* 115* 116*  BUN 18 10 15   CREATININE 0.96 0.79 0.97  CALCIUM 8.5* 8.3* 8.2*  GFRNONAA >60 >60 >60  PROT 6.7 5.6* 5.7*  ALBUMIN 3.2* 3.1* 2.8*  AST 25 19 42*  ALT 9 7 8   ALKPHOS 922* 1,159* 412*  BILITOT 0.5 0.5 0.7    RADIOGRAPHIC STUDIES: I have personally reviewed the radiological images as listed and agreed with the findings in the report. No results found.   Prostate cancer (HCC) #Stage IV-castrate resistant prostate cancer [OCT 2023] with multiple bone metastases/extensive lymphadenopathy; Currently s/p cycle # 6 of Taxotere [60 mg per metered square]- finished JULY, 2023. NOV 2023-PET scan shows multiple progressive bone lesions; shows no focal activity in the prostate/abdominal lymph nodes.   # Currently status post Pluvicto #4.  Patient noted to have progression of disease-based on worsening cytopenia [concerning for bone marrow involvement]; and rising PSA.  And also in general given overall clinical deterioration.  # I had a long discussion with the patient and family given the incurable nature of the disease /poor tolerance of therapy.  I also discussed given general less than 6 months of life expectancy, I introduced hospice philosophy to the patient and family.    Discussed that goal of care should be directed to symptom management rather than treating the underlying disease; and in the process help improve quality of life rather than quantity.  Discussed with hospice team would include-nurse, nurse aide, social worker and chaplain for help  take care of patient with physical/emotional needs.  I also reviewed in cause of emergency/acute cardiorespiratory failure patient could potentially undergo interventions including but not limited  to chest compressions, intubation, defibrillation and antiarrhythmic to the right.  However given patient's frail status and incurable malignancy I would  recommend against any aggressive interventions.  I would recommend no code.  However no noted patient's made at this time. Recommend await evaluation with hospice.   # Discussed with [employer].   # DISPOSITION:   #referral to hospice-  # cancel all appts-  Dr.B   # 40 minutes face-to-face with the patient discussing the above plan of care; more than 50% of time spent on prognosis/ natural history; counseling and coordination.      Earna Coder, MD 05/23/2023 3:31 PM

## 2023-05-23 NOTE — Progress Notes (Signed)
Patient says that he has vertigo. His grandson says that with his every move he looks like he is in pain. Isn't able to eat anything and is very weak and fatigued. Sunday he wasn't able to keep his food down. Anytime he eats it goes right through him. He says that he feels better when he lays down. Hemorid.

## 2023-05-23 NOTE — Progress Notes (Signed)
Palliative Medicine [facility] [phone] Fax:([phone]) [phone]   Name: Eric [name] Date: 05/23/2023 MRN: [id number]  DOB: July 22, 1941  Patient Care Team: Alba Cory, MD as PCP - General (Family Medicine) Earna Coder, MD as Consulting Physician (Oncology) Riki Altes, MD (Urology)    REASON FOR CONSULTATION: Eric [name] is a 82 y.o. 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  Patient currently on treatment with ADT/Eligard/Pluvicto.  He was referred to palliative care to address goals and manage ongoing symptoms.  SOCIAL HISTORY:     reports that he has been smoking cigarettes. He has a 30 pack-year smoking history. He has never used smokeless tobacco. He reports that he does not currently use alcohol. He reports that he does not currently use drugs.  Patient lives at home with a girlfriend.  He has a daughter and granddaughter who live nearby and are involved.  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  ADVANCE DIRECTIVES:    CODE STATUS: DNR/DNI (DNR order signed on 05/23/23)  PAST MEDICAL HISTORY: Past Medical History:  Diagnosis Date   Cancer (HCC)    COPD (chronic obstructive pulmonary disease) (HCC)    Dyspnea    with exertion   Family history of cancer    GERD (gastroesophageal reflux disease)    occ   Hyperlipidemia    Hypertension     PAST SURGICAL HISTORY:  Past Surgical History:  Procedure Laterality Date   COLONOSCOPY     HEMORROIDECTOMY     IR IMAGING GUIDED PORT INSERTION  12/20/2021   PROSTATE BIOPSY N/A 10/26/2021   Procedure: PROSTATE BIOPSY;  Surgeon: Riki Altes, MD;  Location: [facility] ORS;  Service: Urology;  Laterality: N/A;   TRANSRECTAL ULTRASOUND N/A 10/26/2021   Procedure: TRANSRECTAL  ULTRASOUND;  Surgeon: Riki Altes, MD;  Location: [facility] ORS;  Service: Urology;  Laterality: N/A;    HEMATOLOGY/ONCOLOGY HISTORY:  Oncology History Overview Note  compatible with widespread metastatic prostate cancer, including innumerable sclerotic osseous lesions and extensive lymphadenopathy in the pelvis and retroperitoneum, as detailed above. 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 2. There is also an indeterminate hypovascular lesion in segment 5 of the liver which may represent a metastatic lesion. This could be better characterized with follow-up abdominal MRI with and without IV gadolinium if clinically appropriate. 3. Aortic atherosclerosis with mild fusiform aneurysmal dilatation of the infrarenal abdominal aorta measuring up to 3.2 x 3.0 cm. Recommend follow-up ultrasound every 3 years. This recommendation follows ACR consensus guidelines: White Paper of the ACR Incidental Findings Committee II on Vascular Findings. J Am Coll Radiol 2013; 10:789-794. 4. Bladder wall appears mildly thickened, likely related to bladder outlet obstruction given the enlarged prostate gland. 5. Severe colonic diverticulosis without evidence of acute diverticulitis at this time.  IMPRESSION: There are numerous foci of abnormal tracer uptake in the axial and proximal appendicular skeleton consistent with extensive skeletal metastatic disease.     #Stage IV castrate sensitive prostate cancer-December/20 2023-MRI negative for any liver lesions.; FEB 24th, 2022-Firmagon loading dose;  # march 24th-Taxoeter with udenyca; 4/12- add darolutamide  # DEC 2022- 374 [[facility]; Urology]   Prostate cancer (HCC)  12/13/2021 Initial Diagnosis   Prostate cancer (HCC)   12/13/2021 Cancer  Staging   Staging form: Prostate, AJCC 8th Edition - Clinical: Stage IVB (cT2c, cN1, cM1, PSA: 374,  Grade Group: 5) - Signed by Earna Coder, MD on 12/13/2021 Prostate specific antigen (PSA) range: 20 or greater Histologic grading system: 5 grade system   01/12/2022 - 04/28/2022 Chemotherapy   Patient is on Treatment Plan : PROSTATE Docetaxel + Prednisone q21d      Genetic Testing   Negative genetic testing. No pathogenic variants identified on the [facility] Common Hereditary Cancers +RNA panel. VUS in BRIP1 called c.485G>T identified. The report date is 03/04/2022.  The Common Hereditary Cancers Panel + RNA offered by [facility] includes sequencing and/or deletion duplication testing of the following 47 genes: APC, ATM, AXIN2, BARD1, BMPR1A, BRCA1, BRCA2, BRIP1, CDH1, CDKN2A (p14ARF), CDKN2A (p16INK4a), CKD4, CHEK2, CTNNA1, DICER1, EPCAM (Deletion/duplication testing only), GREM1 (promoter region deletion/duplication testing only), KIT, MEN1, MLH1, MSH2, MSH3, MSH6, MUTYH, NBN, NF1, NHTL1, PALB2, PDGFRA, PMS2, POLD1, POLE, PTEN, RAD50, RAD51C, RAD51D, SDHB, SDHC, SDHD, SMAD4, SMARCA4. STK11, TP53, TSC1, TSC2, and VHL.  The following genes were evaluated for sequence changes only: SDHA and HOXB13 c.251G>A variant only.     ALLERGIES:  has No Known Allergies.  MEDICATIONS:  Current Outpatient Medications  Medication Sig Dispense Refill   acetaminophen (TYLENOL) 500 MG tablet Take 1 tablet (500 mg total) by mouth every 6 (six) hours as needed. 30 tablet 0   albuterol (VENTOLIN HFA) 108 (90 Base) MCG/ACT inhaler TAKE 2 PUFFS BY MOUTH EVERY 6 HOURS AS NEEDED FOR WHEEZE OR SHORTNESS OF BREATH 18 each 5   atorvastatin (LIPITOR) 40 MG tablet TAKE 1 TABLET (40 MG TOTAL) BY MOUTH DAILY. IN PLACE OF ROSUVASTATIN DUE TO CANER TREATMENT 90 tablet 1   Ca Carbonate-Mag Hydroxide (ROLAIDS PO) Take 1 tablet by mouth as needed.     ezetimibe (ZETIA) 10 MG tablet Take 1 tablet (10 mg total) by mouth every morning. 90 tablet 1   Fluticasone-Umeclidin-Vilant (TRELEGY ELLIPTA) 100-62.5-25 MCG/ACT AEPB Inhale 1  puff into the lungs daily. 3 each 1   gabapentin (NEURONTIN) 100 MG capsule Take 1 pill at nighttime for 1 week.  If tolerating well take 2 pills at night. (Patient not taking: Reported on 08/10/2022) 60 capsule 0   HYDROcodone-acetaminophen (NORCO) 5-325 MG tablet Take 1 tablet by mouth every 6 (six) hours as needed for moderate pain. 30 tablet 0   lidocaine-prilocaine (EMLA) cream Apply on the port. 30 -45 min  prior to port access. 30 g 3   lisinopril (ZESTRIL) 2.5 MG tablet Take by mouth.     metoprolol succinate (TOPROL-XL) 25 MG 24 hr tablet Take 1 tablet (25 mg total) by mouth daily. Take with or immediately following a meal. 90 tablet 1   OLANZapine (ZYPREXA) 5 MG tablet TAKE 1 TABLET BY MOUTH EVERYDAY AT BEDTIME 90 tablet 2   ondansetron (ZOFRAN) 8 MG tablet One pill every 8 hours as needed for nausea/vomitting. (Patient not taking: Reported on 03/24/2023) 40 tablet 1   prochlorperazine (COMPAZINE) 10 MG tablet Take 1 tablet (10 mg total) by mouth every 6 (six) hours as needed for nausea or vomiting. 40 tablet 1   tamsulosin (FLOMAX) 0.4 MG CAPS capsule Take 1 capsule (0.4 mg total) by mouth daily after supper. 90 capsule 1   No current facility-administered medications for this visit.    VITAL SIGNS: There were no vitals taken for this visit. There were no vitals filed for this visit.  Estimated body mass index is 19.05 kg/m as  calculated from the following:   Height as of an earlier encounter on 05/23/23: 5\' 6"  (1.676 m).   Weight as of an earlier encounter on 05/23/23: 118 lb (53.5 kg).  LABS: CBC:    Component Value Date/Time   WBC 3.6 (L) 05/23/2023 1255   WBC 4.4 03/23/2023 1122   HGB 7.0 (L) 05/23/2023 1255   HCT 22.2 (L) 05/23/2023 1255   PLT 33 (L) 05/23/2023 1255   MCV 102.3 (H) 05/23/2023 1255   NEUTROABS 2.4 05/23/2023 1255   LYMPHSABS 0.9 05/23/2023 1255   MONOABS 0.3 05/23/2023 1255   EOSABS 0.0 05/23/2023 1255   BASOSABS 0.0 05/23/2023 1255   Comprehensive  Metabolic Panel:    Component Value Date/Time   NA 136 05/23/2023 1255   NA 142 08/17/2015 0928   K 4.0 05/23/2023 1255   CL 109 05/23/2023 1255   CO2 19 (L) 05/23/2023 1255   BUN 15 05/23/2023 1255   BUN 17 08/17/2015 0928   CREATININE 0.97 05/23/2023 1255   CREATININE 1.06 09/30/2021 1119   GLUCOSE 116 (H) 05/23/2023 1255   CALCIUM 8.2 (L) 05/23/2023 1255   AST 42 (H) 05/23/2023 1255   ALT 8 05/23/2023 1255   ALKPHOS 412 (H) 05/23/2023 1255   BILITOT 0.7 05/23/2023 1255   PROT 5.7 (L) 05/23/2023 1255   PROT 6.8 08/17/2015 0928   ALBUMIN 2.8 (L) 05/23/2023 1255   ALBUMIN 4.1 08/17/2015 0928    RADIOGRAPHIC STUDIES: No results found.  PERFORMANCE STATUS (ECOG) : 1 - Symptomatic but completely ambulatory  Review of Systems Unless otherwise noted, a complete review of systems is negative.  Physical Exam General: NAD Pulmonary: Unlabored Extremities: no edema, no joint deformities Skin: no rashes Neurological: Weakness but otherwise nonfocal  IMPRESSION: Follow-up visit.  Patient accompanied by his grandson.  Patient doing poorly.  Oral intake is minimal.  Performance status is declining.  He has refractory anemia.  Patient saw Dr. [name] today and is no longer felt to be a viable candidate for further cancer treatment.  Hospice was recommended.  I spoke with patient and grandson and we discussed the likelihood that patient is nearing end-of-life.  Patient confirmed desire to focus on quality of life and comfort at home.  He agreed to hospice involvement.  Symptomatically, he says pain is somewhat improved.  He has not been taking Norco due to constipating effects.  We discussed bowel regimen in detail today.  Discussed CODE STATUS and patient confirmed desire for DNR/DNI.  DNR order signed for him to take time.  PLAN: -Best supportive care -Referral to hospice -DNR -Follow-up as needed  Case and plan discussed with Dr. [name]  Patient expressed  understanding and was in agreement with this plan. He also understands that He can call the clinic at any time with any questions, concerns, or complaints.     Time Total: 20 minutes  Visit consisted of counseling and education dealing with the complex and emotionally intense issues of symptom management and palliative care in the setting of serious and potentially life-threatening illness.Greater than 50%  of this time was spent counseling and coordinating care related to the above assessment and plan.  Signed by: Laurette Schimke, PhD, NP-C

## 2023-05-23 NOTE — Assessment & Plan Note (Addendum)
#  Stage IV-castrate resistant prostate cancer [OCT 2023] with multiple bone metastases/extensive lymphadenopathy; Currently s/p cycle # 6 of Taxotere [60 mg per metered square]- finished JULY, 2023. NOV 2023-PET scan shows multiple progressive bone lesions; shows no focal activity in the prostate/abdominal lymph nodes.   # Currently status post Pluvicto #4.  Patient noted to have progression of disease-based on worsening cytopenia [concerning for bone marrow involvement]; and rising PSA.  And also in general given overall clinical deterioration.  # I had a long discussion with the patient and family given the incurable nature of the disease /poor tolerance of therapy.  I also discussed given general less than 6 months of life expectancy, I introduced hospice philosophy to the patient and family.    Discussed that goal of care should be directed to symptom management rather than treating the underlying disease; and in the process help improve quality of life rather than quantity.  Discussed with hospice team would include-nurse, nurse aide, social worker and chaplain for help take care of patient with physical/emotional needs.  I also reviewed in cause of emergency/acute cardiorespiratory failure patient could potentially undergo interventions including but not limited to chest compressions, intubation, defibrillation and antiarrhythmic to the right.  However given patient's frail status and incurable malignancy I would  recommend against any aggressive interventions.  I would recommend no code.  However no noted patient's made at this time. Recommend await evaluation with hospice.   # Discussed with [employer].   # DISPOSITION:   #referral to hospice-  # cancel all appts-  Dr.B   # 40 minutes face-to-face with the patient discussing the above plan of care; more than 50% of time spent on prognosis/ natural history; counseling and coordination.

## 2023-05-24 ENCOUNTER — Inpatient Hospital Stay: Payer: Medicare HMO

## 2023-05-26 ENCOUNTER — Telehealth: Payer: Self-pay

## 2023-05-26 NOTE — Telephone Encounter (Signed)
Amy Parson from [facility] called stating that patient has been dizzy and been light headed and BP is low. Since Wednesday they have been holding Metoprolol due to BP being low. She is calling to see what they can do for the patient. Call back #[phone]

## 2023-06-05 ENCOUNTER — Other Ambulatory Visit: Payer: Medicare HMO

## 2023-06-06 MED FILL — Lutetium Lu 177 Vipivotide Tetraxetan IV Soln 1000 MBq/ML: INTRAVENOUS | Qty: 34 | Status: CN

## 2023-06-15 ENCOUNTER — Other Ambulatory Visit (HOSPITAL_COMMUNITY): Payer: Medicare HMO

## 2023-06-20 ENCOUNTER — Telehealth: Payer: Medicare HMO | Admitting: Hospice and Palliative Medicine

## 2023-06-25 DEATH — deceased

## 2023-08-03 MED FILL — Lutetium Lu 177 Vipivotide Tetraxetan IV Soln 1000 MBq/ML: INTRAVENOUS | Qty: 34 | Status: CN

## 2023-09-25 ENCOUNTER — Other Ambulatory Visit: Payer: Medicare HMO

## 2023-09-25 ENCOUNTER — Ambulatory Visit: Payer: Medicare HMO | Admitting: Internal Medicine

## 2023-09-26 ENCOUNTER — Ambulatory Visit: Payer: Medicare HMO
